# Patient Record
Sex: Female | Born: 1975
Health system: Southern US, Community
[De-identification: ages and names within clinical notes are randomized; demographics above are authoritative.]

## PROBLEM LIST (undated history)

## (undated) DIAGNOSIS — E039 Hypothyroidism, unspecified: Secondary | ICD-10-CM

## (undated) DIAGNOSIS — F419 Anxiety disorder, unspecified: Secondary | ICD-10-CM

## (undated) DIAGNOSIS — F32A Depression, unspecified: Secondary | ICD-10-CM

## (undated) DIAGNOSIS — F319 Bipolar disorder, unspecified: Secondary | ICD-10-CM

## (undated) DIAGNOSIS — F429 Obsessive-compulsive disorder, unspecified: Secondary | ICD-10-CM

## (undated) DIAGNOSIS — R7303 Prediabetes: Secondary | ICD-10-CM

## (undated) DIAGNOSIS — I10 Essential (primary) hypertension: Secondary | ICD-10-CM

## (undated) DIAGNOSIS — G43909 Migraine, unspecified, not intractable, without status migrainosus: Secondary | ICD-10-CM

## (undated) DIAGNOSIS — F909 Attention-deficit hyperactivity disorder, unspecified type: Secondary | ICD-10-CM

## (undated) DIAGNOSIS — G4730 Sleep apnea, unspecified: Secondary | ICD-10-CM

## (undated) DIAGNOSIS — T7840XA Allergy, unspecified, initial encounter: Secondary | ICD-10-CM

## (undated) DIAGNOSIS — M549 Dorsalgia, unspecified: Secondary | ICD-10-CM

## (undated) DIAGNOSIS — E282 Polycystic ovarian syndrome: Secondary | ICD-10-CM

## (undated) DIAGNOSIS — J45909 Unspecified asthma, uncomplicated: Secondary | ICD-10-CM

## (undated) DIAGNOSIS — Z9109 Other allergy status, other than to drugs and biological substances: Secondary | ICD-10-CM

## (undated) DIAGNOSIS — E785 Hyperlipidemia, unspecified: Secondary | ICD-10-CM

## (undated) DIAGNOSIS — G5603 Carpal tunnel syndrome, bilateral upper limbs: Secondary | ICD-10-CM

## (undated) HISTORY — DX: Unspecified asthma, uncomplicated: J45.909

## (undated) HISTORY — DX: Allergy, unspecified, initial encounter: T78.40XA

## (undated) HISTORY — DX: Sleep apnea, unspecified: G47.30

## (undated) HISTORY — PX: EYE SURGERY: SHX253

## (undated) HISTORY — DX: Hyperlipidemia, unspecified: E78.5

## (undated) HISTORY — DX: Essential (primary) hypertension: I10

## (undated) HISTORY — PX: TONSILLECTOMY: SUR1361

## (undated) HISTORY — DX: Depression, unspecified: F32.A

## (undated) HISTORY — DX: Hypothyroidism, unspecified: E03.9

## (undated) HISTORY — DX: Dorsalgia, unspecified: M54.9

## (undated) HISTORY — DX: Prediabetes: R73.03

## (undated) HISTORY — PX: TONSILECTOMY, ADENOIDECTOMY, BILATERAL MYRINGOTOMY AND TUBES: SHX2538

---

## 1997-09-11 ENCOUNTER — Other Ambulatory Visit: Admission: RE | Admit: 1997-09-11 | Discharge: 1997-09-11 | Payer: Self-pay | Admitting: *Deleted

## 1998-09-09 ENCOUNTER — Other Ambulatory Visit: Admission: RE | Admit: 1998-09-09 | Discharge: 1998-09-09 | Payer: Self-pay | Admitting: *Deleted

## 1999-09-06 ENCOUNTER — Other Ambulatory Visit: Admission: RE | Admit: 1999-09-06 | Discharge: 1999-09-06 | Payer: Self-pay | Admitting: Obstetrics and Gynecology

## 2000-09-11 ENCOUNTER — Other Ambulatory Visit: Admission: RE | Admit: 2000-09-11 | Discharge: 2000-09-11 | Payer: Self-pay | Admitting: Obstetrics and Gynecology

## 2004-07-07 ENCOUNTER — Ambulatory Visit: Payer: Self-pay | Admitting: Family Medicine

## 2004-07-14 ENCOUNTER — Encounter: Admission: RE | Admit: 2004-07-14 | Discharge: 2004-07-14 | Payer: Self-pay | Admitting: Family Medicine

## 2004-07-21 ENCOUNTER — Ambulatory Visit: Payer: Self-pay | Admitting: Family Medicine

## 2004-10-18 ENCOUNTER — Other Ambulatory Visit: Admission: RE | Admit: 2004-10-18 | Discharge: 2004-10-18 | Payer: Self-pay | Admitting: Obstetrics and Gynecology

## 2004-10-21 ENCOUNTER — Ambulatory Visit: Payer: Self-pay | Admitting: Family Medicine

## 2006-12-11 ENCOUNTER — Ambulatory Visit: Payer: Self-pay | Admitting: Cardiology

## 2010-07-19 NOTE — Assessment & Plan Note (Signed)
[facility]                            CARDIOLOGY OFFICE NOTE   NAME:Park, [name] GUNDLACH                    MRN:          [id number]  DATE:12/11/2006                            DOB:          1975-07-22    CHIEF COMPLAINT:  I am worried about my cholesterol and my family  history of heart disease.   HISTORY OF PRESENT ILLNESS:  [name] Park is a 35 year old single white  female, daughter of patients of mine, who comes today referred for  cardiovascular risk assessment.   Her blood pressure was up recently, but she thought this was secondary  to stress.  She has a history of mixed hyperlipidemia, total cholesterol  of 219, triglycerides 284, HDL 50, LDL 112, VLDL 57.  She also has a  history of being overweight and does exercise, but only about twice a  week.  She denies any chest discomfort or angina.  She has some mild  shortness of breath with exertion, which is probably deconditioning.   PAST MEDICAL HISTORY:  She has no history of dye allergy.  She is  sensitive to YELLOW FOOD DYE.   CURRENT MEDICATIONS:  1. Lorazepam 1 mg 1 to 2 a day.  2. Metformin 500 mg 2 to 3 times a day for polycystic ovary disease.  3. Zyrtec 10 mg a day.  4. Imipramine 50 mg p.o. t.i.d.  5. Lamotrigine 200 mg a day.   She has a history of bipolar disorder, polycystic ovary disease.  Her  recent hemoglobin A1c and thyroid profile were normal.  Recent  comprehensive metabolic panel was also normal, except for a blood sugar  of 140.  Hemoglobin A1c was 5.7.   She does not smoke.  She does not use any recreational drugs.  She has  about 1 drink a day of alcohol.   PREVIOUS SURGERIES:  Eye surgery of both eyes in the 1980s.  Tonsillectomy in the 1980s.   FAMILY HISTORY:  Father had coronary artery disease in his 90s.  He is  still alive.   SOCIAL HISTORY:  Single.  She works in administration.  She spends a  large amount of her time sitting and standing.  There is  no manual work  involved.   REVIEW OF SYSTEMS:  Other than allergies and hay fever, menstrual  dysfunction, anxiety, depression, negative review of systems.   EXAM:  A very pleasant lady in no acute distress.  She is alert and  oriented x3.  Blood pressure is 106/74, pulse 93 and regular.  Her electrocardiogram  is normal, except for some nonspecific T wave changes.  She is 5 feet 1-  1/2 inches, 172 pounds.  HEENT:  Normocephalic, atraumatic.  PERRLA.  Extraocular muscles are  intact.  Sclerae clear.  She wears glasses.  Facial symmetry is normal.  Dentition is satisfactory.  NECK:  Supple.  Carotid upstrokes are equal bilaterally without bruits.  No JVD.  Thyroid is not enlarged.  Trachea is midline.  LUNGS:  Clear.  HEART:  Regular rate and rhythm.  Nondisplaced PMI.  Normal S1, S2.  ABDOMEN:  Soft.  Obese.  Organomegaly cannot be adequately assessed.  EXTREMITIES:  No edema.  Pulses are intact.  NEURO:  Intact.  SKIN:  Unremarkable.   ASSESSMENT AND PLAN:  Ms. Coles biggest problem is her weight and  mixed hyperlipidemia, which is fairly mild.   RECOMMENDATIONS:  1. Cardio activity 3 hours per week.  I told her this would reduce her      risk for premature cardiovascular event by 20-25% per year.  2. Decrease her weight.  3. Avoid high glycemic foods.  Web site was given for her review.      Specific common foods were reviewed as well.  4. Follow blood pressure, though this is not high today.  5. See me back p.r.n.     Thomas C. Daleen Squibb, MD, [facility]  Electronically Signed    TCW/MedQ  DD: 12/11/2006  DT: 12/11/2006  Job #: [id number]   cc:   Marcelino Duster L. Vincente Poli, M.D.

## 2012-11-22 ENCOUNTER — Encounter (HOSPITAL_COMMUNITY): Payer: Self-pay

## 2012-11-22 ENCOUNTER — Emergency Department (HOSPITAL_COMMUNITY)
Admission: EM | Admit: 2012-11-22 | Discharge: 2012-11-22 | Disposition: A | Discharge status: Home / Self Care | Payer: BC Managed Care – PPO | Attending: Emergency Medicine | Admitting: Emergency Medicine

## 2012-11-22 DIAGNOSIS — S0990XD Unspecified injury of head, subsequent encounter: Secondary | ICD-10-CM

## 2012-11-22 DIAGNOSIS — F429 Obsessive-compulsive disorder, unspecified: Secondary | ICD-10-CM | POA: Insufficient documentation

## 2012-11-22 DIAGNOSIS — F319 Bipolar disorder, unspecified: Secondary | ICD-10-CM | POA: Insufficient documentation

## 2012-11-22 DIAGNOSIS — F909 Attention-deficit hyperactivity disorder, unspecified type: Secondary | ICD-10-CM | POA: Insufficient documentation

## 2012-11-22 DIAGNOSIS — Z8669 Personal history of other diseases of the nervous system and sense organs: Secondary | ICD-10-CM | POA: Insufficient documentation

## 2012-11-22 DIAGNOSIS — G43909 Migraine, unspecified, not intractable, without status migrainosus: Secondary | ICD-10-CM | POA: Insufficient documentation

## 2012-11-22 DIAGNOSIS — Z79899 Other long term (current) drug therapy: Secondary | ICD-10-CM | POA: Insufficient documentation

## 2012-11-22 DIAGNOSIS — F411 Generalized anxiety disorder: Secondary | ICD-10-CM | POA: Insufficient documentation

## 2012-11-22 DIAGNOSIS — Z8679 Personal history of other diseases of the circulatory system: Secondary | ICD-10-CM | POA: Insufficient documentation

## 2012-11-22 DIAGNOSIS — Z8742 Personal history of other diseases of the female genital tract: Secondary | ICD-10-CM | POA: Insufficient documentation

## 2012-11-22 DIAGNOSIS — G44309 Post-traumatic headache, unspecified, not intractable: Secondary | ICD-10-CM | POA: Insufficient documentation

## 2012-11-22 HISTORY — DX: Polycystic ovarian syndrome: E28.2

## 2012-11-22 HISTORY — DX: Obsessive-compulsive disorder, unspecified: F42.9

## 2012-11-22 HISTORY — DX: Attention-deficit hyperactivity disorder, unspecified type: F90.9

## 2012-11-22 HISTORY — DX: Migraine, unspecified, not intractable, without status migrainosus: G43.909

## 2012-11-22 HISTORY — DX: Carpal tunnel syndrome, bilateral upper limbs: G56.03

## 2012-11-22 HISTORY — DX: Anxiety disorder, unspecified: F41.9

## 2012-11-22 HISTORY — DX: Other allergy status, other than to drugs and biological substances: Z91.09

## 2012-11-22 HISTORY — DX: Bipolar disorder, unspecified: F31.9

## 2012-11-22 NOTE — ED Provider Notes (Signed)
TIME SEEN: 5:37 PM  CHIEF COMPLAINT: Head injury  HPI: Patient is a 37 year old female with a history of chronic migraine headaches currently receiving Botox injections, OCD, ADD, bipolar disorder who presents emergency department for evaluation after head injury this morning. Patient reports that she was at her job and lifting a box of oranges onto a shelf. She states the box BM sliding backwards and knocked her into a countertop or she hit the right side of her head. She denies any loss of consciousness. She has had mild headache on the right side of her head since that she describes as a throbbing without radiation. She is also having bilateral neck pain which she believes may be from her Botox injections. No confusion, nausea or vomiting, numbness, tingling or focal weakness.  ROS: See HPI Constitutional: no fever  Eyes: no drainage  ENT: no runny nose   Cardiovascular:  no chest pain  Resp: no SOB  GI: no vomiting GU: no dysuria Integumentary: no rash  Allergy: no hives  Musculoskeletal: no leg swelling  Neurological: no slurred speech ROS otherwise negative  PAST MEDICAL HISTORY/PAST SURGICAL HISTORY:  Past Medical History  Diagnosis Date  . Migraine   . Environmental allergies   . Polycystic ovarian disease   . Bipolar 1 disorder   . Anxiety   . OCD (obsessive compulsive disorder)   . ADHD (attention deficit hyperactivity disorder)   . Carpal tunnel syndrome, bilateral     MEDICATIONS:  Prior to Admission medications   Medication Sig Start Date End Date Taking? Authorizing Provider  albuterol (PROVENTIL HFA;VENTOLIN HFA) 108 (90 BASE) MCG/ACT inhaler Inhale 2 puffs into the lungs every 6 (six) hours as needed for wheezing.   Yes Historical Provider, MD  amphetamine-dextroamphetamine (ADDERALL) 30 MG tablet Take 30 mg by mouth daily.   Yes Historical Provider, MD  baclofen (LIORESAL) 10 MG tablet Take 10 mg by mouth daily as needed (for migraines).   Yes Historical  Provider, MD  cetirizine (ZYRTEC) 10 MG tablet Take 10 mg by mouth daily.   Yes Historical Provider, MD  chlorproMAZINE (THORAZINE) 25 MG tablet Take 25 mg by mouth 3 (three) times daily.   Yes Historical Provider, MD  diazepam (VALIUM) 10 MG tablet Take 10 mg by mouth 3 (three) times daily.   Yes Historical Provider, MD  escitalopram (LEXAPRO) 10 MG tablet Take 10 mg by mouth daily.   Yes Historical Provider, MD  fluticasone (VERAMYST) 27.5 MCG/SPRAY nasal spray Place 2 sprays into the nose daily as needed for allergies.   Yes Historical Provider, MD  ibuprofen (ADVIL,MOTRIN) 200 MG tablet Take 400 mg by mouth every 6 (six) hours as needed (for back pain).   Yes Historical Provider, MD  lamoTRIgine (LAMICTAL) 150 MG tablet Take 300 mg by mouth at bedtime.   Yes Historical Provider, MD  Levonorgestrel-Ethinyl Estradiol (SEASONIQUE) 0.15-0.03 &0.01 MG tablet Take 1 tablet by mouth daily.   Yes Historical Provider, MD  metFORMIN (GLUCOPHAGE) 500 MG tablet Take 500 mg by mouth 3 (three) times daily.   Yes Historical Provider, MD  montelukast (SINGULAIR) 10 MG tablet Take 10 mg by mouth every evening.   Yes Historical Provider, MD  OnabotulinumtoxinA (BOTOX IJ) Inject as directed.   Yes Historical Provider, MD  PRESCRIPTION MEDICATION Apply 1 application topically. For facial rosacea   Yes Historical Provider, MD  QUEtiapine (SEROQUEL) 100 MG tablet Take 100 mg by mouth at bedtime.   Yes Historical Provider, MD  spironolactone (ALDACTONE) 50 MG tablet  Take 50 mg by mouth daily.   Yes Historical Provider, MD    ALLERGIES:  Allergies  Allergen Reactions  . Latex Rash    SOCIAL HISTORY:  History  Substance Use Topics  . Smoking status: Never Smoker   . Smokeless tobacco: Not on file  . Alcohol Use: Yes     Comment: ocassional     FAMILY HISTORY: History reviewed. No pertinent family history.  EXAM: BP 126/78  Pulse 89  Temp(Src) 98.5 F (36.9 C) (Oral)  Resp 16  SpO2 98%  LMP  09/21/2012 CONSTITUTIONAL: Alert and oriented and responds appropriately to questions. Well-appearing; well-nourished HEAD: Normocephalic, no lesions on the right side of her scalp EYES: Conjunctivae clear, PERRL ENT: normal nose; no rhinorrhea; moist mucous membranes; pharynx without lesions noted NECK: Supple, no meningismus, no LAD , no midline spinal tenderness, step-off or deformity CARD: RRR; S1 and S2 appreciated; no murmurs, no clicks, no rubs, no gallops RESP: Normal chest excursion without splinting or tachypnea; breath sounds clear and equal bilaterally; no wheezes, no rhonchi, no rales,  ABD/GI: Normal bowel sounds; non-distended; soft, non-tender, no rebound, no guarding BACK:  The back appears normal and is non-tender to palpation, there is no CVA tenderness EXT: Normal ROM in all joints; non-tender to palpation; no edema; normal capillary refill; no cyanosis    SKIN: Normal color for age and race; warm NEURO: Moves all extremities equally, strength 5/5 in all 4 extremities, sensation to light touch intact diffusely, cranial nerves II through XII intact, no dysmetria to finger-nose testing, normal gait PSYCH: The patient's mood and manner are appropriate. Grooming and personal hygiene are appropriate.  MEDICAL DECISION MAKING: Patient with minor head injury with possible mild concussion. Discussed with patient and mother at bedside I do not feel the patient needs a head CT at this time given her headache as mild, she is neurologically intact in the incident was several hours ago. Patient and mother agree with this plan. Patient has pain medication at home. Given strict return precautions for head injury and supportive care instructions.       Layla Maw Dreyson Mishkin, DO 11/22/12 1739

## 2012-11-22 NOTE — ED Notes (Signed)
Pt c/o Right side head pain due to hitting her head on the corner of a metal shelf while trying to push a box of oranges under the shelf. Pt denies dizziness, sensitivity to light, nausea or vomiting. Pt seen at [facility] and sent her to rule out a concussion or possible blood clot. Pt just received botox injections 11/27/2012 and is still experiencing neck pain from the injections

## 2012-12-18 ENCOUNTER — Other Ambulatory Visit: Payer: Self-pay | Admitting: Specialist

## 2012-12-25 ENCOUNTER — Ambulatory Visit
Admission: RE | Admit: 2012-12-25 | Discharge: 2012-12-25 | Disposition: A | Discharge status: Home / Self Care | Payer: BC Managed Care – PPO | Source: Ambulatory Visit | Attending: Specialist | Admitting: Specialist

## 2015-05-13 ENCOUNTER — Other Ambulatory Visit (INDEPENDENT_AMBULATORY_CARE_PROVIDER_SITE_OTHER): Payer: Self-pay | Admitting: Otolaryngology

## 2015-05-13 DIAGNOSIS — J329 Chronic sinusitis, unspecified: Secondary | ICD-10-CM

## 2015-05-24 ENCOUNTER — Ambulatory Visit
Admission: RE | Admit: 2015-05-24 | Discharge: 2015-05-24 | Disposition: A | Discharge status: Home / Self Care | Payer: BLUE CROSS/BLUE SHIELD | Source: Ambulatory Visit | Attending: Otolaryngology | Admitting: Otolaryngology

## 2015-05-24 DIAGNOSIS — J329 Chronic sinusitis, unspecified: Secondary | ICD-10-CM

## 2015-05-29 ENCOUNTER — Encounter (HOSPITAL_COMMUNITY): Payer: Self-pay

## 2015-05-29 ENCOUNTER — Emergency Department (HOSPITAL_COMMUNITY)
Admission: EM | Admit: 2015-05-29 | Discharge: 2015-05-29 | Disposition: A | Discharge status: Home / Self Care | Payer: Worker's Compensation | Attending: Emergency Medicine | Admitting: Emergency Medicine

## 2015-05-29 DIAGNOSIS — S199XXA Unspecified injury of neck, initial encounter: Secondary | ICD-10-CM | POA: Insufficient documentation

## 2015-05-29 DIAGNOSIS — F909 Attention-deficit hyperactivity disorder, unspecified type: Secondary | ICD-10-CM | POA: Insufficient documentation

## 2015-05-29 DIAGNOSIS — S0003XA Contusion of scalp, initial encounter: Secondary | ICD-10-CM | POA: Diagnosis not present

## 2015-05-29 DIAGNOSIS — F319 Bipolar disorder, unspecified: Secondary | ICD-10-CM | POA: Insufficient documentation

## 2015-05-29 DIAGNOSIS — Z793 Long term (current) use of hormonal contraceptives: Secondary | ICD-10-CM | POA: Diagnosis not present

## 2015-05-29 DIAGNOSIS — F419 Anxiety disorder, unspecified: Secondary | ICD-10-CM | POA: Insufficient documentation

## 2015-05-29 DIAGNOSIS — G8929 Other chronic pain: Secondary | ICD-10-CM | POA: Insufficient documentation

## 2015-05-29 DIAGNOSIS — Z79899 Other long term (current) drug therapy: Secondary | ICD-10-CM | POA: Diagnosis not present

## 2015-05-29 DIAGNOSIS — F429 Obsessive-compulsive disorder, unspecified: Secondary | ICD-10-CM | POA: Insufficient documentation

## 2015-05-29 DIAGNOSIS — Z91040 Latex allergy status: Secondary | ICD-10-CM | POA: Insufficient documentation

## 2015-05-29 DIAGNOSIS — Y990 Civilian activity done for income or pay: Secondary | ICD-10-CM | POA: Insufficient documentation

## 2015-05-29 DIAGNOSIS — Y92210 Daycare center as the place of occurrence of the external cause: Secondary | ICD-10-CM | POA: Insufficient documentation

## 2015-05-29 DIAGNOSIS — G43909 Migraine, unspecified, not intractable, without status migrainosus: Secondary | ICD-10-CM | POA: Diagnosis not present

## 2015-05-29 DIAGNOSIS — Z8639 Personal history of other endocrine, nutritional and metabolic disease: Secondary | ICD-10-CM | POA: Insufficient documentation

## 2015-05-29 DIAGNOSIS — Y9389 Activity, other specified: Secondary | ICD-10-CM | POA: Diagnosis not present

## 2015-05-29 DIAGNOSIS — Z7984 Long term (current) use of oral hypoglycemic drugs: Secondary | ICD-10-CM | POA: Insufficient documentation

## 2015-05-29 DIAGNOSIS — S0990XA Unspecified injury of head, initial encounter: Secondary | ICD-10-CM

## 2015-05-29 DIAGNOSIS — W01190A Fall on same level from slipping, tripping and stumbling with subsequent striking against furniture, initial encounter: Secondary | ICD-10-CM | POA: Insufficient documentation

## 2015-05-29 MED ORDER — CYCLOBENZAPRINE HCL 10 MG PO TABS: 10.0000 mg | ORAL_TABLET | Freq: Two times a day (BID) | ORAL | Status: DC | PRN

## 2015-05-29 NOTE — ED Notes (Signed)
Sheliah MendsLeisa, PA at the bedside

## 2015-05-29 NOTE — Discharge Instructions (Signed)
For your head pain, take your home pain meds, use tylenol and ibuprofen as needed.  I have provided muscle relaxers for your neck pain.  Follow up with your regular doctor or neurologist if not improving with resting.   Head Injury, Adult You have a head injury. Headaches and throwing up (vomiting) are common after a head injury. It should be easy to wake up from sleeping. Sometimes you must stay in the hospital. Most problems happen within the first 24 hours. Side effects may occur up to 7-10 days after the injury.  WHAT ARE THE TYPES OF HEAD INJURIES? Head injuries can be as minor as a bump. Some head injuries can be more severe. More severe head injuries include:  A jarring injury to the brain (concussion).  A bruise of the brain (contusion). This mean there is bleeding in the brain that can cause swelling.  A cracked skull (skull fracture).  Bleeding in the brain that collects, clots, and forms a bump (hematoma). WHEN SHOULD I GET HELP RIGHT AWAY?   You are confused or sleepy.  You cannot be woken up.  You feel sick to your stomach (nauseous) or keep throwing up (vomiting).  Your dizziness or unsteadiness is getting worse.  You have very bad, lasting headaches that are not helped by medicine. Take medicines only as told by your doctor.  You cannot use your arms or legs like normal.  You cannot walk.  You notice changes in the black spots in the center of the colored part of your eye (pupil).  You have clear or bloody fluid coming from your nose or ears.  You have trouble seeing. During the next 24 hours after the injury, you must stay with someone who can watch you. This person should get help right away (call 911 in the [place] if you start to shake and are not able to control it (have seizures), you pass out, or you are unable to wake up. HOW CAN I PREVENT A HEAD INJURY IN THE FUTURE?  Wear seat belts.  Wear a helmet while bike riding and playing sports like  football.  Stay away from dangerous activities around the house. WHEN CAN I RETURN TO NORMAL ACTIVITIES AND ATHLETICS? See your doctor before doing these activities. You should not do normal activities or play contact sports until 1 week after the following symptoms have stopped:  Headache that does not go away.  Dizziness.  Poor attention.  Confusion.  Memory problems.  Sickness to your stomach or throwing up.  Tiredness.  Fussiness.  Bothered by bright lights or loud noises.  Anxiousness or depression.  Restless sleep. MAKE SURE YOU:   Understand these instructions.  Will watch your condition.  Will get help right away if you are not doing well or get worse.   This information is not intended to replace advice given to you by your health care provider. Make sure you discuss any questions you have with your health care provider.   Document Released: 02/03/2008 Document Revised: 03/13/2014 Document Reviewed: 10/28/2012 Elsevier Interactive Patient Education 2016 [employer] Inc[employer]   Facial or Scalp Contusion A facial or scalp contusion is a deep bruise on the face or head. Injuries to the face and head generally cause a lot of swelling, especially around the eyes. Contusions are the result of an injury that caused bleeding under the skin. The contusion may turn blue, purple, or yellow. Minor injuries will give you a painless contusion, but more severe contusions may stay painful  and swollen for a few weeks.  CAUSES  A facial or scalp contusion is caused by a blunt injury or trauma to the face or head area.  SIGNS AND SYMPTOMS   Swelling of the injured area.   Discoloration of the injured area.   Tenderness, soreness, or pain in the injured area.  DIAGNOSIS  The diagnosis can be made by taking a medical history and doing a physical exam. An X-ray exam, CT scan, or MRI may be needed to determine if there are any associated injuries, such as broken bones  (fractures). TREATMENT  Often, the best treatment for a facial or scalp contusion is applying cold compresses to the injured area. Over-the-counter medicines may also be recommended for pain control.  HOME CARE INSTRUCTIONS   Only take over-the-counter or prescription medicines as directed by your health care provider.   Apply ice to the injured area.   Put ice in a plastic bag.   Place a towel between your skin and the bag.   Leave the ice on for 20 minutes, 2-3 times a day.  SEEK MEDICAL CARE IF:  You have bite problems.   You have pain with chewing.   You are concerned about facial defects. SEEK IMMEDIATE MEDICAL CARE IF:  You have severe pain or a headache that is not relieved by medicine.   You have unusual sleepiness, confusion, or personality changes.   You throw up (vomit).   You have a persistent nosebleed.   You have double vision or blurred vision.   You have fluid drainage from your nose or ear.   You have difficulty walking or using your arms or legs.  MAKE SURE YOU:   Understand these instructions.  Will watch your condition.  Will get help right away if you are not doing well or get worse.   This information is not intended to replace advice given to you by your health care provider. Make sure you discuss any questions you have with your health care provider.   Document Released: 03/30/2004 Document Revised: 03/13/2014 Document Reviewed: 10/03/2012 Elsevier Interactive Patient Education [employer]

## 2015-05-29 NOTE — ED Provider Notes (Signed)
CSN: [id number]     Arrival date & time 05/29/15  1008 History   First MD Initiated Contact with Patient 05/29/15 1053     Chief Complaint  Patient presents with  . Head Injury     (Consider location/radiation/quality/duration/timing/severity/associated sxs/prior Treatment) HPI   Patient is a 40 year old female with history of migraines, bipolar, anxiety, OCD, ADHD, she presents emergency department for evaluation of head injury that occurred yesterday while at work. 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 She denies loss of consciousness, bleeding or laceration. She denies blood thinner use.  She states this feels a bump all over the back of her head and has associated achy and dull pain on her scalp, rated 6 out of 10. Patient was encouraged by her work to get evaluated for the injury. She first presented to urgent care with and referred her to the ER because they were unable to do any imaging of her head.   She states that she has persistent migraines 24 hours a day, is treated with multiple medications and injections. She states that her head injury has not worsened her chronic migraine pain and she has not taken medication for her migraines today. She has mild neck soreness which has gradually increased since the time the accident. She denies any visual disturbances, blurry vision, vertigo, confusion, syncope, nausea, vomiting, drainage from ears or nose. She denies any other complaints at this time.  Past Medical History  Diagnosis Date  . Migraine   . Environmental allergies   . Polycystic ovarian disease   . Bipolar 1 disorder (HCC)   . Anxiety   . OCD (obsessive compulsive disorder)   . ADHD (attention deficit hyperactivity disorder)   . Carpal tunnel syndrome, bilateral    Past Surgical History  Procedure Laterality Date  . Tonsillectomy    . Eye surgery    . Tonsilectomy,  adenoidectomy, bilateral myringotomy and tubes     No family history on file. Social History  Substance Use Topics  . Smoking status: Never Smoker   . Smokeless tobacco: None  . Alcohol Use: Yes     Comment: ocassional    OB History    No data available     Review of Systems  Constitutional: Negative.  Negative for fever, chills, diaphoresis, activity change, appetite change and fatigue.  HENT: Negative.   Eyes: Negative.  Negative for photophobia and visual disturbance.  Respiratory: Negative.   Cardiovascular: Negative.   Gastrointestinal: Negative.   Endocrine: Negative.   Genitourinary: Negative.   Musculoskeletal: Positive for neck pain. Negative for back pain, gait problem and neck stiffness.  Skin: Negative.  Negative for color change, pallor and wound.  Neurological: Negative for dizziness, tremors, syncope, facial asymmetry, weakness, light-headedness, numbness and headaches.  Psychiatric/Behavioral: Negative.   All other systems reviewed and are negative.     Allergies  Latex  Home Medications   Prior to Admission medications   Medication Sig Start Date End Date Taking? Authorizing Provider  albuterol (PROVENTIL HFA;VENTOLIN HFA) 108 (90 BASE) MCG/ACT inhaler Inhale 2 puffs into the lungs every 6 (six) hours as needed for wheezing.    Historical Provider, MD  amphetamine-dextroamphetamine (ADDERALL) 30 MG tablet Take 30 mg by mouth daily.    Historical Provider, MD  baclofen (LIORESAL) 10 MG tablet Take 10 mg by mouth daily as needed (for migraines).    Historical Provider,  MD  cetirizine (ZYRTEC) 10 MG tablet Take 10 mg by mouth daily.    Historical Provider, MD  chlorproMAZINE (THORAZINE) 25 MG tablet Take 25 mg by mouth 3 (three) times daily.    Historical Provider, MD  cyclobenzaprine (FLEXERIL) 10 MG tablet Take 1 tablet (10 mg total) by mouth 2 (two) times daily as needed for muscle spasms. 05/29/15   Danelle Berry, PA-C  diazepam (VALIUM) 10 MG tablet Take  10 mg by mouth 3 (three) times daily.    Historical Provider, MD  escitalopram (LEXAPRO) 10 MG tablet Take 10 mg by mouth daily.    Historical Provider, MD  fluticasone (VERAMYST) 27.5 MCG/SPRAY nasal spray Place 2 sprays into the nose daily as needed for allergies.    Historical Provider, MD  ibuprofen (ADVIL,MOTRIN) 200 MG tablet Take 400 mg by mouth every 6 (six) hours as needed (for back pain).    Historical Provider, MD  lamoTRIgine (LAMICTAL) 150 MG tablet Take 300 mg by mouth at bedtime.    Historical Provider, MD  Levonorgestrel-Ethinyl Estradiol (SEASONIQUE) 0.15-0.03 &0.01 MG tablet Take 1 tablet by mouth daily.    Historical Provider, MD  metFORMIN (GLUCOPHAGE) 500 MG tablet Take 500 mg by mouth 3 (three) times daily.    Historical Provider, MD  montelukast (SINGULAIR) 10 MG tablet Take 10 mg by mouth every evening.    Historical Provider, MD  OnabotulinumtoxinA (BOTOX IJ) Inject as directed.    Historical Provider, MD  PRESCRIPTION MEDICATION Apply 1 application topically. For facial rosacea    Historical Provider, MD  QUEtiapine (SEROQUEL) 100 MG tablet Take 100 mg by mouth at bedtime.    Historical Provider, MD  spironolactone (ALDACTONE) 50 MG tablet Take 50 mg by mouth daily.    Historical Provider, MD   BP 112/78 mmHg  Pulse 98  Temp(Src) 98.3 F (36.8 C) (Oral)  Resp 16  SpO2 99% Physical Exam  Constitutional: She is oriented to person, place, and time. She appears well-developed and well-nourished. No distress.  HENT:  Head: Normocephalic and atraumatic. Head is without raccoon's eyes, without Battle's sign, without abrasion, without contusion, without laceration, without right periorbital erythema and without left periorbital erythema. Hair is normal.  Right Ear: Tympanic membrane and external ear normal.  Left Ear: Tympanic membrane and external ear normal.  Nose: Nose normal.  Mouth/Throat: Uvula is midline, oropharynx is clear and moist and mucous membranes are  normal. No oropharyngeal exudate.  Eyes: Conjunctivae and EOM are normal. Pupils are equal, round, and reactive to light. Right eye exhibits no discharge. Left eye exhibits no discharge. No scleral icterus.  Neck: Normal range of motion and phonation normal. Neck supple. No JVD present. Muscular tenderness present. No spinous process tenderness present. No rigidity. No tracheal deviation, no edema, no erythema and normal range of motion present. No thyromegaly present.    Cardiovascular: Normal rate, regular rhythm, normal heart sounds and intact distal pulses.  Exam reveals no gallop and no friction rub.   No murmur heard. Pulmonary/Chest: Effort normal and breath sounds normal. No respiratory distress. She has no wheezes. She has no rales. She exhibits no tenderness.  Abdominal: Soft. Bowel sounds are normal. She exhibits no distension and no mass. There is no tenderness. There is no rebound and no guarding.  Musculoskeletal: Normal range of motion. She exhibits no edema or tenderness.  Lymphadenopathy:    She has no cervical adenopathy.  Neurological: She is alert and oriented to person, place, and time. She has normal strength  and normal reflexes. She is not disoriented. She displays no tremor. No cranial nerve deficit or sensory deficit. She exhibits normal muscle tone. Coordination and gait normal. GCS eye subscore is 4. GCS verbal subscore is 5. GCS motor subscore is 6.  Speech is clear and goal oriented, follows commands CN 2-12 grossly intact Normal strength in upper and lower extremities bilaterally including dorsiflexion and plantar flexion, strong and equal grip strength Sensation normal to light and sharp touch Moves extremities without ataxia, coordination intact Normal finger to nose Neg romberg, no pronator drift Normal gait and balance   Skin: Skin is warm and dry. No rash noted. She is not diaphoretic. No erythema. No pallor.  Psychiatric: She has a normal mood and affect.  Her behavior is normal. Judgment and thought content normal.  Nursing note and vitals reviewed.   ED Course  Procedures (including critical care time) Labs Review Labs Reviewed - No data to display  Imaging Review No results found. I have personally reviewed and evaluated these images and lab results as part of my medical decision-making.   EKG Interpretation None      MDM   Pt with minor head injury that occurred yesterday, no LOC, no blood thinner use, no visible or palpable evidence of injury to posterior scalp.  Pt has unremarkable PE, normal neuro.  No indication for CT imaging.  She has hx of chronic and severe migraines, which she reports have not worsened since her head injury.  She has mild posterior neck muscle tenderness, feel Flexeril is appropriate for associated soreness and stiffness. She has no midline tenderness.  She was encouraged to rest, as she may have a concussion. She was encouraged to see her PCP or neurologist if she does not improve with rest and supportive treatment.  A work note was provided until she can see her doctor on Tuesday.  Final diagnoses:  Closed head injury, initial encounter  Scalp contusion, initial encounter       Danelle Berry, PA-C 05/31/15 0305  Rolan Bucco, MD 06/05/15 [phone]

## 2015-05-29 NOTE — ED Notes (Addendum)
Per Pt, Pt was at work last night when she fell back and hit her head on a table. Pt denies loss of consciousness, bleeding, or laceration. Pt reports edema noted to the site of the injury yesterday. Pt was told to go to [facility] by employer, but office was closed. Pt was told to go to [facility]. While at [facility], pt was not able to get any imaging so she stated they sent her over here. Pt is alert and oriented x4. Pt ambulated upon arrival. Pt denies being on blood thinners. Hx of POCS, Bipolar, OCD, ADD, and Migraines,

## 2015-11-18 ENCOUNTER — Other Ambulatory Visit: Payer: Self-pay | Admitting: Allergy and Immunology

## 2015-11-18 ENCOUNTER — Ambulatory Visit
Admission: RE | Admit: 2015-11-18 | Discharge: 2015-11-18 | Disposition: A | Discharge status: Home / Self Care | Payer: BLUE CROSS/BLUE SHIELD | Source: Ambulatory Visit | Attending: Allergy and Immunology | Admitting: Allergy and Immunology

## 2015-11-18 DIAGNOSIS — R059 Cough, unspecified: Secondary | ICD-10-CM

## 2015-11-18 DIAGNOSIS — R05 Cough: Secondary | ICD-10-CM

## 2015-12-24 ENCOUNTER — Ambulatory Visit
Admission: RE | Admit: 2015-12-24 | Discharge: 2015-12-24 | Disposition: A | Discharge status: Home / Self Care | Payer: BLUE CROSS/BLUE SHIELD | Source: Ambulatory Visit | Attending: Allergy and Immunology | Admitting: Allergy and Immunology

## 2015-12-24 ENCOUNTER — Other Ambulatory Visit: Payer: Self-pay | Admitting: Allergy and Immunology

## 2015-12-24 DIAGNOSIS — J180 Bronchopneumonia, unspecified organism: Secondary | ICD-10-CM

## 2016-03-16 DIAGNOSIS — F3130 Bipolar disorder, current episode depressed, mild or moderate severity, unspecified: Secondary | ICD-10-CM | POA: Diagnosis not present

## 2016-03-29 DIAGNOSIS — J301 Allergic rhinitis due to pollen: Secondary | ICD-10-CM | POA: Diagnosis not present

## 2016-03-29 DIAGNOSIS — J3089 Other allergic rhinitis: Secondary | ICD-10-CM | POA: Diagnosis not present

## 2016-03-29 DIAGNOSIS — J3081 Allergic rhinitis due to animal (cat) (dog) hair and dander: Secondary | ICD-10-CM | POA: Diagnosis not present

## 2016-04-12 DIAGNOSIS — J3081 Allergic rhinitis due to animal (cat) (dog) hair and dander: Secondary | ICD-10-CM | POA: Diagnosis not present

## 2016-04-12 DIAGNOSIS — J301 Allergic rhinitis due to pollen: Secondary | ICD-10-CM | POA: Diagnosis not present

## 2016-04-12 DIAGNOSIS — J3089 Other allergic rhinitis: Secondary | ICD-10-CM | POA: Diagnosis not present

## 2016-04-19 DIAGNOSIS — J301 Allergic rhinitis due to pollen: Secondary | ICD-10-CM | POA: Diagnosis not present

## 2016-04-19 DIAGNOSIS — J3081 Allergic rhinitis due to animal (cat) (dog) hair and dander: Secondary | ICD-10-CM | POA: Diagnosis not present

## 2016-04-19 DIAGNOSIS — J3089 Other allergic rhinitis: Secondary | ICD-10-CM | POA: Diagnosis not present

## 2016-04-26 DIAGNOSIS — J301 Allergic rhinitis due to pollen: Secondary | ICD-10-CM | POA: Diagnosis not present

## 2016-04-26 DIAGNOSIS — J3081 Allergic rhinitis due to animal (cat) (dog) hair and dander: Secondary | ICD-10-CM | POA: Diagnosis not present

## 2016-04-26 DIAGNOSIS — J3089 Other allergic rhinitis: Secondary | ICD-10-CM | POA: Diagnosis not present

## 2016-05-03 DIAGNOSIS — J301 Allergic rhinitis due to pollen: Secondary | ICD-10-CM | POA: Diagnosis not present

## 2016-05-03 DIAGNOSIS — J3089 Other allergic rhinitis: Secondary | ICD-10-CM | POA: Diagnosis not present

## 2016-05-03 DIAGNOSIS — J3081 Allergic rhinitis due to animal (cat) (dog) hair and dander: Secondary | ICD-10-CM | POA: Diagnosis not present

## 2016-05-09 DIAGNOSIS — F900 Attention-deficit hyperactivity disorder, predominantly inattentive type: Secondary | ICD-10-CM | POA: Diagnosis not present

## 2016-05-10 DIAGNOSIS — J3089 Other allergic rhinitis: Secondary | ICD-10-CM | POA: Diagnosis not present

## 2016-05-10 DIAGNOSIS — J301 Allergic rhinitis due to pollen: Secondary | ICD-10-CM | POA: Diagnosis not present

## 2016-05-10 DIAGNOSIS — J3081 Allergic rhinitis due to animal (cat) (dog) hair and dander: Secondary | ICD-10-CM | POA: Diagnosis not present

## 2016-05-10 DIAGNOSIS — J4520 Mild intermittent asthma, uncomplicated: Secondary | ICD-10-CM | POA: Diagnosis not present

## 2016-05-17 DIAGNOSIS — M542 Cervicalgia: Secondary | ICD-10-CM | POA: Diagnosis not present

## 2016-05-17 DIAGNOSIS — G518 Other disorders of facial nerve: Secondary | ICD-10-CM | POA: Diagnosis not present

## 2016-05-17 DIAGNOSIS — M791 Myalgia: Secondary | ICD-10-CM | POA: Diagnosis not present

## 2016-05-17 DIAGNOSIS — G43719 Chronic migraine without aura, intractable, without status migrainosus: Secondary | ICD-10-CM | POA: Diagnosis not present

## 2016-05-30 DIAGNOSIS — F3130 Bipolar disorder, current episode depressed, mild or moderate severity, unspecified: Secondary | ICD-10-CM | POA: Diagnosis not present

## 2016-05-30 DIAGNOSIS — F4010 Social phobia, unspecified: Secondary | ICD-10-CM | POA: Diagnosis not present

## 2016-05-31 DIAGNOSIS — J3081 Allergic rhinitis due to animal (cat) (dog) hair and dander: Secondary | ICD-10-CM | POA: Diagnosis not present

## 2016-05-31 DIAGNOSIS — J3089 Other allergic rhinitis: Secondary | ICD-10-CM | POA: Diagnosis not present

## 2016-05-31 DIAGNOSIS — J301 Allergic rhinitis due to pollen: Secondary | ICD-10-CM | POA: Diagnosis not present

## 2016-06-05 DIAGNOSIS — J069 Acute upper respiratory infection, unspecified: Secondary | ICD-10-CM | POA: Diagnosis not present

## 2016-06-05 DIAGNOSIS — J209 Acute bronchitis, unspecified: Secondary | ICD-10-CM | POA: Diagnosis not present

## 2016-06-08 DIAGNOSIS — J3081 Allergic rhinitis due to animal (cat) (dog) hair and dander: Secondary | ICD-10-CM | POA: Diagnosis not present

## 2016-06-08 DIAGNOSIS — J301 Allergic rhinitis due to pollen: Secondary | ICD-10-CM | POA: Diagnosis not present

## 2016-06-09 DIAGNOSIS — J3089 Other allergic rhinitis: Secondary | ICD-10-CM | POA: Diagnosis not present

## 2016-06-16 DIAGNOSIS — Z01419 Encounter for gynecological examination (general) (routine) without abnormal findings: Secondary | ICD-10-CM | POA: Diagnosis not present

## 2016-06-16 DIAGNOSIS — Z6828 Body mass index (BMI) 28.0-28.9, adult: Secondary | ICD-10-CM | POA: Diagnosis not present

## 2016-06-21 DIAGNOSIS — J3089 Other allergic rhinitis: Secondary | ICD-10-CM | POA: Diagnosis not present

## 2016-06-21 DIAGNOSIS — J301 Allergic rhinitis due to pollen: Secondary | ICD-10-CM | POA: Diagnosis not present

## 2016-06-21 DIAGNOSIS — J3081 Allergic rhinitis due to animal (cat) (dog) hair and dander: Secondary | ICD-10-CM | POA: Diagnosis not present

## 2016-06-26 DIAGNOSIS — J301 Allergic rhinitis due to pollen: Secondary | ICD-10-CM | POA: Diagnosis not present

## 2016-06-26 DIAGNOSIS — J3089 Other allergic rhinitis: Secondary | ICD-10-CM | POA: Diagnosis not present

## 2016-06-26 DIAGNOSIS — J3081 Allergic rhinitis due to animal (cat) (dog) hair and dander: Secondary | ICD-10-CM | POA: Diagnosis not present

## 2016-06-28 DIAGNOSIS — F909 Attention-deficit hyperactivity disorder, unspecified type: Secondary | ICD-10-CM | POA: Diagnosis not present

## 2016-06-28 DIAGNOSIS — J4520 Mild intermittent asthma, uncomplicated: Secondary | ICD-10-CM | POA: Diagnosis not present

## 2016-06-28 DIAGNOSIS — J301 Allergic rhinitis due to pollen: Secondary | ICD-10-CM | POA: Diagnosis not present

## 2016-06-28 DIAGNOSIS — J3089 Other allergic rhinitis: Secondary | ICD-10-CM | POA: Diagnosis not present

## 2016-06-28 DIAGNOSIS — F3130 Bipolar disorder, current episode depressed, mild or moderate severity, unspecified: Secondary | ICD-10-CM | POA: Diagnosis not present

## 2016-06-28 DIAGNOSIS — J3081 Allergic rhinitis due to animal (cat) (dog) hair and dander: Secondary | ICD-10-CM | POA: Diagnosis not present

## 2016-06-29 DIAGNOSIS — J3081 Allergic rhinitis due to animal (cat) (dog) hair and dander: Secondary | ICD-10-CM | POA: Diagnosis not present

## 2016-06-29 DIAGNOSIS — J301 Allergic rhinitis due to pollen: Secondary | ICD-10-CM | POA: Diagnosis not present

## 2016-06-29 DIAGNOSIS — J3089 Other allergic rhinitis: Secondary | ICD-10-CM | POA: Diagnosis not present

## 2016-06-29 DIAGNOSIS — J4520 Mild intermittent asthma, uncomplicated: Secondary | ICD-10-CM | POA: Diagnosis not present

## 2016-07-03 DIAGNOSIS — J301 Allergic rhinitis due to pollen: Secondary | ICD-10-CM | POA: Diagnosis not present

## 2016-07-03 DIAGNOSIS — J3089 Other allergic rhinitis: Secondary | ICD-10-CM | POA: Diagnosis not present

## 2016-07-03 DIAGNOSIS — J3081 Allergic rhinitis due to animal (cat) (dog) hair and dander: Secondary | ICD-10-CM | POA: Diagnosis not present

## 2016-07-05 DIAGNOSIS — J3081 Allergic rhinitis due to animal (cat) (dog) hair and dander: Secondary | ICD-10-CM | POA: Diagnosis not present

## 2016-07-05 DIAGNOSIS — J3089 Other allergic rhinitis: Secondary | ICD-10-CM | POA: Diagnosis not present

## 2016-07-05 DIAGNOSIS — J301 Allergic rhinitis due to pollen: Secondary | ICD-10-CM | POA: Diagnosis not present

## 2016-07-10 DIAGNOSIS — N921 Excessive and frequent menstruation with irregular cycle: Secondary | ICD-10-CM | POA: Diagnosis not present

## 2016-07-10 DIAGNOSIS — R102 Pelvic and perineal pain: Secondary | ICD-10-CM | POA: Diagnosis not present

## 2016-07-12 DIAGNOSIS — J301 Allergic rhinitis due to pollen: Secondary | ICD-10-CM | POA: Diagnosis not present

## 2016-07-12 DIAGNOSIS — J3089 Other allergic rhinitis: Secondary | ICD-10-CM | POA: Diagnosis not present

## 2016-07-12 DIAGNOSIS — J3081 Allergic rhinitis due to animal (cat) (dog) hair and dander: Secondary | ICD-10-CM | POA: Diagnosis not present

## 2016-07-19 DIAGNOSIS — J3089 Other allergic rhinitis: Secondary | ICD-10-CM | POA: Diagnosis not present

## 2016-07-19 DIAGNOSIS — J301 Allergic rhinitis due to pollen: Secondary | ICD-10-CM | POA: Diagnosis not present

## 2016-07-19 DIAGNOSIS — J3081 Allergic rhinitis due to animal (cat) (dog) hair and dander: Secondary | ICD-10-CM | POA: Diagnosis not present

## 2016-07-26 DIAGNOSIS — J3089 Other allergic rhinitis: Secondary | ICD-10-CM | POA: Diagnosis not present

## 2016-07-26 DIAGNOSIS — J3081 Allergic rhinitis due to animal (cat) (dog) hair and dander: Secondary | ICD-10-CM | POA: Diagnosis not present

## 2016-07-26 DIAGNOSIS — J301 Allergic rhinitis due to pollen: Secondary | ICD-10-CM | POA: Diagnosis not present

## 2016-08-02 DIAGNOSIS — J3081 Allergic rhinitis due to animal (cat) (dog) hair and dander: Secondary | ICD-10-CM | POA: Diagnosis not present

## 2016-08-02 DIAGNOSIS — J301 Allergic rhinitis due to pollen: Secondary | ICD-10-CM | POA: Diagnosis not present

## 2016-08-02 DIAGNOSIS — J3089 Other allergic rhinitis: Secondary | ICD-10-CM | POA: Diagnosis not present

## 2016-08-09 DIAGNOSIS — J3089 Other allergic rhinitis: Secondary | ICD-10-CM | POA: Diagnosis not present

## 2016-08-09 DIAGNOSIS — J3081 Allergic rhinitis due to animal (cat) (dog) hair and dander: Secondary | ICD-10-CM | POA: Diagnosis not present

## 2016-08-09 DIAGNOSIS — J301 Allergic rhinitis due to pollen: Secondary | ICD-10-CM | POA: Diagnosis not present

## 2016-08-10 DIAGNOSIS — F900 Attention-deficit hyperactivity disorder, predominantly inattentive type: Secondary | ICD-10-CM | POA: Diagnosis not present

## 2016-08-15 DIAGNOSIS — G518 Other disorders of facial nerve: Secondary | ICD-10-CM | POA: Diagnosis not present

## 2016-08-15 DIAGNOSIS — G43719 Chronic migraine without aura, intractable, without status migrainosus: Secondary | ICD-10-CM | POA: Diagnosis not present

## 2016-08-15 DIAGNOSIS — M542 Cervicalgia: Secondary | ICD-10-CM | POA: Diagnosis not present

## 2016-08-15 DIAGNOSIS — M791 Myalgia: Secondary | ICD-10-CM | POA: Diagnosis not present

## 2016-08-16 DIAGNOSIS — J3089 Other allergic rhinitis: Secondary | ICD-10-CM | POA: Diagnosis not present

## 2016-08-16 DIAGNOSIS — J301 Allergic rhinitis due to pollen: Secondary | ICD-10-CM | POA: Diagnosis not present

## 2016-08-16 DIAGNOSIS — J3081 Allergic rhinitis due to animal (cat) (dog) hair and dander: Secondary | ICD-10-CM | POA: Diagnosis not present

## 2016-08-28 DIAGNOSIS — J3081 Allergic rhinitis due to animal (cat) (dog) hair and dander: Secondary | ICD-10-CM | POA: Diagnosis not present

## 2016-08-28 DIAGNOSIS — F909 Attention-deficit hyperactivity disorder, unspecified type: Secondary | ICD-10-CM | POA: Diagnosis not present

## 2016-08-28 DIAGNOSIS — J301 Allergic rhinitis due to pollen: Secondary | ICD-10-CM | POA: Diagnosis not present

## 2016-08-28 DIAGNOSIS — F3130 Bipolar disorder, current episode depressed, mild or moderate severity, unspecified: Secondary | ICD-10-CM | POA: Diagnosis not present

## 2016-08-28 DIAGNOSIS — J3089 Other allergic rhinitis: Secondary | ICD-10-CM | POA: Diagnosis not present

## 2016-08-30 DIAGNOSIS — J3081 Allergic rhinitis due to animal (cat) (dog) hair and dander: Secondary | ICD-10-CM | POA: Diagnosis not present

## 2016-08-30 DIAGNOSIS — J3089 Other allergic rhinitis: Secondary | ICD-10-CM | POA: Diagnosis not present

## 2016-08-30 DIAGNOSIS — J301 Allergic rhinitis due to pollen: Secondary | ICD-10-CM | POA: Diagnosis not present

## 2016-09-05 DIAGNOSIS — J301 Allergic rhinitis due to pollen: Secondary | ICD-10-CM | POA: Diagnosis not present

## 2016-09-05 DIAGNOSIS — J3081 Allergic rhinitis due to animal (cat) (dog) hair and dander: Secondary | ICD-10-CM | POA: Diagnosis not present

## 2016-09-05 DIAGNOSIS — J3089 Other allergic rhinitis: Secondary | ICD-10-CM | POA: Diagnosis not present

## 2016-09-05 DIAGNOSIS — F900 Attention-deficit hyperactivity disorder, predominantly inattentive type: Secondary | ICD-10-CM | POA: Diagnosis not present

## 2016-09-13 DIAGNOSIS — J3089 Other allergic rhinitis: Secondary | ICD-10-CM | POA: Diagnosis not present

## 2016-09-13 DIAGNOSIS — J3081 Allergic rhinitis due to animal (cat) (dog) hair and dander: Secondary | ICD-10-CM | POA: Diagnosis not present

## 2016-09-13 DIAGNOSIS — J301 Allergic rhinitis due to pollen: Secondary | ICD-10-CM | POA: Diagnosis not present

## 2016-09-14 DIAGNOSIS — L218 Other seborrheic dermatitis: Secondary | ICD-10-CM | POA: Diagnosis not present

## 2016-09-14 DIAGNOSIS — L2081 Atopic neurodermatitis: Secondary | ICD-10-CM | POA: Diagnosis not present

## 2016-09-14 DIAGNOSIS — L718 Other rosacea: Secondary | ICD-10-CM | POA: Diagnosis not present

## 2016-09-19 DIAGNOSIS — F900 Attention-deficit hyperactivity disorder, predominantly inattentive type: Secondary | ICD-10-CM | POA: Diagnosis not present

## 2016-09-20 DIAGNOSIS — J301 Allergic rhinitis due to pollen: Secondary | ICD-10-CM | POA: Diagnosis not present

## 2016-09-20 DIAGNOSIS — J3089 Other allergic rhinitis: Secondary | ICD-10-CM | POA: Diagnosis not present

## 2016-09-20 DIAGNOSIS — J3081 Allergic rhinitis due to animal (cat) (dog) hair and dander: Secondary | ICD-10-CM | POA: Diagnosis not present

## 2016-09-26 DIAGNOSIS — F3130 Bipolar disorder, current episode depressed, mild or moderate severity, unspecified: Secondary | ICD-10-CM | POA: Diagnosis not present

## 2016-09-26 DIAGNOSIS — F4010 Social phobia, unspecified: Secondary | ICD-10-CM | POA: Diagnosis not present

## 2016-09-26 DIAGNOSIS — F909 Attention-deficit hyperactivity disorder, unspecified type: Secondary | ICD-10-CM | POA: Diagnosis not present

## 2016-09-27 DIAGNOSIS — J301 Allergic rhinitis due to pollen: Secondary | ICD-10-CM | POA: Diagnosis not present

## 2016-09-27 DIAGNOSIS — J3089 Other allergic rhinitis: Secondary | ICD-10-CM | POA: Diagnosis not present

## 2016-09-27 DIAGNOSIS — J3081 Allergic rhinitis due to animal (cat) (dog) hair and dander: Secondary | ICD-10-CM | POA: Diagnosis not present

## 2016-10-04 DIAGNOSIS — F900 Attention-deficit hyperactivity disorder, predominantly inattentive type: Secondary | ICD-10-CM | POA: Diagnosis not present

## 2016-10-04 DIAGNOSIS — J019 Acute sinusitis, unspecified: Secondary | ICD-10-CM | POA: Diagnosis not present

## 2016-10-13 DIAGNOSIS — G43719 Chronic migraine without aura, intractable, without status migrainosus: Secondary | ICD-10-CM | POA: Diagnosis not present

## 2016-10-18 DIAGNOSIS — J3089 Other allergic rhinitis: Secondary | ICD-10-CM | POA: Diagnosis not present

## 2016-10-18 DIAGNOSIS — J301 Allergic rhinitis due to pollen: Secondary | ICD-10-CM | POA: Diagnosis not present

## 2016-10-18 DIAGNOSIS — J3081 Allergic rhinitis due to animal (cat) (dog) hair and dander: Secondary | ICD-10-CM | POA: Diagnosis not present

## 2016-10-25 DIAGNOSIS — J3089 Other allergic rhinitis: Secondary | ICD-10-CM | POA: Diagnosis not present

## 2016-10-25 DIAGNOSIS — J3081 Allergic rhinitis due to animal (cat) (dog) hair and dander: Secondary | ICD-10-CM | POA: Diagnosis not present

## 2016-10-25 DIAGNOSIS — J301 Allergic rhinitis due to pollen: Secondary | ICD-10-CM | POA: Diagnosis not present

## 2016-11-01 DIAGNOSIS — J301 Allergic rhinitis due to pollen: Secondary | ICD-10-CM | POA: Diagnosis not present

## 2016-11-01 DIAGNOSIS — J3089 Other allergic rhinitis: Secondary | ICD-10-CM | POA: Diagnosis not present

## 2016-11-01 DIAGNOSIS — J3081 Allergic rhinitis due to animal (cat) (dog) hair and dander: Secondary | ICD-10-CM | POA: Diagnosis not present

## 2016-11-02 DIAGNOSIS — F900 Attention-deficit hyperactivity disorder, predominantly inattentive type: Secondary | ICD-10-CM | POA: Diagnosis not present

## 2016-11-08 DIAGNOSIS — J301 Allergic rhinitis due to pollen: Secondary | ICD-10-CM | POA: Diagnosis not present

## 2016-11-08 DIAGNOSIS — F3130 Bipolar disorder, current episode depressed, mild or moderate severity, unspecified: Secondary | ICD-10-CM | POA: Diagnosis not present

## 2016-11-08 DIAGNOSIS — F4010 Social phobia, unspecified: Secondary | ICD-10-CM | POA: Diagnosis not present

## 2016-11-08 DIAGNOSIS — J3081 Allergic rhinitis due to animal (cat) (dog) hair and dander: Secondary | ICD-10-CM | POA: Diagnosis not present

## 2016-11-08 DIAGNOSIS — F909 Attention-deficit hyperactivity disorder, unspecified type: Secondary | ICD-10-CM | POA: Diagnosis not present

## 2016-11-08 DIAGNOSIS — J3089 Other allergic rhinitis: Secondary | ICD-10-CM | POA: Diagnosis not present

## 2016-11-13 DIAGNOSIS — G43719 Chronic migraine without aura, intractable, without status migrainosus: Secondary | ICD-10-CM | POA: Diagnosis not present

## 2016-11-15 DIAGNOSIS — J3081 Allergic rhinitis due to animal (cat) (dog) hair and dander: Secondary | ICD-10-CM | POA: Diagnosis not present

## 2016-11-15 DIAGNOSIS — J301 Allergic rhinitis due to pollen: Secondary | ICD-10-CM | POA: Diagnosis not present

## 2016-11-15 DIAGNOSIS — J3089 Other allergic rhinitis: Secondary | ICD-10-CM | POA: Diagnosis not present

## 2016-11-22 DIAGNOSIS — J3081 Allergic rhinitis due to animal (cat) (dog) hair and dander: Secondary | ICD-10-CM | POA: Diagnosis not present

## 2016-11-22 DIAGNOSIS — J301 Allergic rhinitis due to pollen: Secondary | ICD-10-CM | POA: Diagnosis not present

## 2016-11-22 DIAGNOSIS — J3089 Other allergic rhinitis: Secondary | ICD-10-CM | POA: Diagnosis not present

## 2016-11-27 DIAGNOSIS — G518 Other disorders of facial nerve: Secondary | ICD-10-CM | POA: Diagnosis not present

## 2016-11-27 DIAGNOSIS — M542 Cervicalgia: Secondary | ICD-10-CM | POA: Diagnosis not present

## 2016-11-27 DIAGNOSIS — G43719 Chronic migraine without aura, intractable, without status migrainosus: Secondary | ICD-10-CM | POA: Diagnosis not present

## 2016-11-27 DIAGNOSIS — M791 Myalgia: Secondary | ICD-10-CM | POA: Diagnosis not present

## 2016-11-29 DIAGNOSIS — F900 Attention-deficit hyperactivity disorder, predominantly inattentive type: Secondary | ICD-10-CM | POA: Diagnosis not present

## 2016-12-06 DIAGNOSIS — J3089 Other allergic rhinitis: Secondary | ICD-10-CM | POA: Diagnosis not present

## 2016-12-06 DIAGNOSIS — J301 Allergic rhinitis due to pollen: Secondary | ICD-10-CM | POA: Diagnosis not present

## 2016-12-06 DIAGNOSIS — J3081 Allergic rhinitis due to animal (cat) (dog) hair and dander: Secondary | ICD-10-CM | POA: Diagnosis not present

## 2016-12-13 DIAGNOSIS — G43719 Chronic migraine without aura, intractable, without status migrainosus: Secondary | ICD-10-CM | POA: Diagnosis not present

## 2016-12-20 DIAGNOSIS — J301 Allergic rhinitis due to pollen: Secondary | ICD-10-CM | POA: Diagnosis not present

## 2016-12-20 DIAGNOSIS — J3081 Allergic rhinitis due to animal (cat) (dog) hair and dander: Secondary | ICD-10-CM | POA: Diagnosis not present

## 2016-12-20 DIAGNOSIS — J3089 Other allergic rhinitis: Secondary | ICD-10-CM | POA: Diagnosis not present

## 2016-12-21 DIAGNOSIS — H5010 Unspecified exotropia: Secondary | ICD-10-CM | POA: Diagnosis not present

## 2016-12-21 DIAGNOSIS — H52203 Unspecified astigmatism, bilateral: Secondary | ICD-10-CM | POA: Diagnosis not present

## 2016-12-21 DIAGNOSIS — H5213 Myopia, bilateral: Secondary | ICD-10-CM | POA: Diagnosis not present

## 2016-12-28 DIAGNOSIS — F900 Attention-deficit hyperactivity disorder, predominantly inattentive type: Secondary | ICD-10-CM | POA: Diagnosis not present

## 2016-12-29 DIAGNOSIS — F4010 Social phobia, unspecified: Secondary | ICD-10-CM | POA: Diagnosis not present

## 2016-12-29 DIAGNOSIS — F3130 Bipolar disorder, current episode depressed, mild or moderate severity, unspecified: Secondary | ICD-10-CM | POA: Diagnosis not present

## 2016-12-29 DIAGNOSIS — F909 Attention-deficit hyperactivity disorder, unspecified type: Secondary | ICD-10-CM | POA: Diagnosis not present

## 2017-01-01 DIAGNOSIS — J4520 Mild intermittent asthma, uncomplicated: Secondary | ICD-10-CM | POA: Diagnosis not present

## 2017-01-01 DIAGNOSIS — J3081 Allergic rhinitis due to animal (cat) (dog) hair and dander: Secondary | ICD-10-CM | POA: Diagnosis not present

## 2017-01-01 DIAGNOSIS — J301 Allergic rhinitis due to pollen: Secondary | ICD-10-CM | POA: Diagnosis not present

## 2017-01-01 DIAGNOSIS — J019 Acute sinusitis, unspecified: Secondary | ICD-10-CM | POA: Diagnosis not present

## 2017-01-17 DIAGNOSIS — G43719 Chronic migraine without aura, intractable, without status migrainosus: Secondary | ICD-10-CM | POA: Diagnosis not present

## 2017-01-17 DIAGNOSIS — J3089 Other allergic rhinitis: Secondary | ICD-10-CM | POA: Diagnosis not present

## 2017-01-17 DIAGNOSIS — J301 Allergic rhinitis due to pollen: Secondary | ICD-10-CM | POA: Diagnosis not present

## 2017-01-17 DIAGNOSIS — J3081 Allergic rhinitis due to animal (cat) (dog) hair and dander: Secondary | ICD-10-CM | POA: Diagnosis not present

## 2017-01-23 DIAGNOSIS — Z23 Encounter for immunization: Secondary | ICD-10-CM | POA: Diagnosis not present

## 2017-01-30 DIAGNOSIS — F900 Attention-deficit hyperactivity disorder, predominantly inattentive type: Secondary | ICD-10-CM | POA: Diagnosis not present

## 2017-01-31 DIAGNOSIS — J301 Allergic rhinitis due to pollen: Secondary | ICD-10-CM | POA: Diagnosis not present

## 2017-01-31 DIAGNOSIS — J3081 Allergic rhinitis due to animal (cat) (dog) hair and dander: Secondary | ICD-10-CM | POA: Diagnosis not present

## 2017-01-31 DIAGNOSIS — J3089 Other allergic rhinitis: Secondary | ICD-10-CM | POA: Diagnosis not present

## 2017-02-09 DIAGNOSIS — J301 Allergic rhinitis due to pollen: Secondary | ICD-10-CM | POA: Diagnosis not present

## 2017-02-09 DIAGNOSIS — J3081 Allergic rhinitis due to animal (cat) (dog) hair and dander: Secondary | ICD-10-CM | POA: Diagnosis not present

## 2017-02-09 DIAGNOSIS — J3089 Other allergic rhinitis: Secondary | ICD-10-CM | POA: Diagnosis not present

## 2017-02-14 DIAGNOSIS — J3081 Allergic rhinitis due to animal (cat) (dog) hair and dander: Secondary | ICD-10-CM | POA: Diagnosis not present

## 2017-02-14 DIAGNOSIS — J3089 Other allergic rhinitis: Secondary | ICD-10-CM | POA: Diagnosis not present

## 2017-02-14 DIAGNOSIS — J301 Allergic rhinitis due to pollen: Secondary | ICD-10-CM | POA: Diagnosis not present

## 2017-02-15 DIAGNOSIS — G43719 Chronic migraine without aura, intractable, without status migrainosus: Secondary | ICD-10-CM | POA: Diagnosis not present

## 2017-02-16 DIAGNOSIS — F4010 Social phobia, unspecified: Secondary | ICD-10-CM | POA: Diagnosis not present

## 2017-02-16 DIAGNOSIS — F909 Attention-deficit hyperactivity disorder, unspecified type: Secondary | ICD-10-CM | POA: Diagnosis not present

## 2017-02-16 DIAGNOSIS — F3130 Bipolar disorder, current episode depressed, mild or moderate severity, unspecified: Secondary | ICD-10-CM | POA: Diagnosis not present

## 2017-02-20 DIAGNOSIS — M542 Cervicalgia: Secondary | ICD-10-CM | POA: Diagnosis not present

## 2017-02-20 DIAGNOSIS — G518 Other disorders of facial nerve: Secondary | ICD-10-CM | POA: Diagnosis not present

## 2017-02-20 DIAGNOSIS — M791 Myalgia, unspecified site: Secondary | ICD-10-CM | POA: Diagnosis not present

## 2017-02-20 DIAGNOSIS — G43719 Chronic migraine without aura, intractable, without status migrainosus: Secondary | ICD-10-CM | POA: Diagnosis not present

## 2017-02-21 DIAGNOSIS — J3089 Other allergic rhinitis: Secondary | ICD-10-CM | POA: Diagnosis not present

## 2017-02-21 DIAGNOSIS — J3081 Allergic rhinitis due to animal (cat) (dog) hair and dander: Secondary | ICD-10-CM | POA: Diagnosis not present

## 2017-02-21 DIAGNOSIS — J301 Allergic rhinitis due to pollen: Secondary | ICD-10-CM | POA: Diagnosis not present

## 2017-02-22 DIAGNOSIS — F900 Attention-deficit hyperactivity disorder, predominantly inattentive type: Secondary | ICD-10-CM | POA: Diagnosis not present

## 2017-02-28 DIAGNOSIS — J301 Allergic rhinitis due to pollen: Secondary | ICD-10-CM | POA: Diagnosis not present

## 2017-02-28 DIAGNOSIS — J3081 Allergic rhinitis due to animal (cat) (dog) hair and dander: Secondary | ICD-10-CM | POA: Diagnosis not present

## 2017-02-28 DIAGNOSIS — J3089 Other allergic rhinitis: Secondary | ICD-10-CM | POA: Diagnosis not present

## 2017-03-07 DIAGNOSIS — J3089 Other allergic rhinitis: Secondary | ICD-10-CM | POA: Diagnosis not present

## 2017-03-07 DIAGNOSIS — J301 Allergic rhinitis due to pollen: Secondary | ICD-10-CM | POA: Diagnosis not present

## 2017-03-07 DIAGNOSIS — J3081 Allergic rhinitis due to animal (cat) (dog) hair and dander: Secondary | ICD-10-CM | POA: Diagnosis not present

## 2017-03-20 DIAGNOSIS — G43719 Chronic migraine without aura, intractable, without status migrainosus: Secondary | ICD-10-CM | POA: Diagnosis not present

## 2017-03-21 DIAGNOSIS — J3089 Other allergic rhinitis: Secondary | ICD-10-CM | POA: Diagnosis not present

## 2017-03-21 DIAGNOSIS — J301 Allergic rhinitis due to pollen: Secondary | ICD-10-CM | POA: Diagnosis not present

## 2017-03-21 DIAGNOSIS — J3081 Allergic rhinitis due to animal (cat) (dog) hair and dander: Secondary | ICD-10-CM | POA: Diagnosis not present

## 2017-03-28 DIAGNOSIS — J3089 Other allergic rhinitis: Secondary | ICD-10-CM | POA: Diagnosis not present

## 2017-03-28 DIAGNOSIS — J3081 Allergic rhinitis due to animal (cat) (dog) hair and dander: Secondary | ICD-10-CM | POA: Diagnosis not present

## 2017-03-28 DIAGNOSIS — J301 Allergic rhinitis due to pollen: Secondary | ICD-10-CM | POA: Diagnosis not present

## 2017-03-29 DIAGNOSIS — F900 Attention-deficit hyperactivity disorder, predominantly inattentive type: Secondary | ICD-10-CM | POA: Diagnosis not present

## 2017-03-30 DIAGNOSIS — F909 Attention-deficit hyperactivity disorder, unspecified type: Secondary | ICD-10-CM | POA: Diagnosis not present

## 2017-03-30 DIAGNOSIS — F3130 Bipolar disorder, current episode depressed, mild or moderate severity, unspecified: Secondary | ICD-10-CM | POA: Diagnosis not present

## 2017-03-30 DIAGNOSIS — F4010 Social phobia, unspecified: Secondary | ICD-10-CM | POA: Diagnosis not present

## 2017-04-05 DIAGNOSIS — J301 Allergic rhinitis due to pollen: Secondary | ICD-10-CM | POA: Diagnosis not present

## 2017-04-05 DIAGNOSIS — J3081 Allergic rhinitis due to animal (cat) (dog) hair and dander: Secondary | ICD-10-CM | POA: Diagnosis not present

## 2017-04-05 DIAGNOSIS — J3089 Other allergic rhinitis: Secondary | ICD-10-CM | POA: Diagnosis not present

## 2017-04-13 DIAGNOSIS — Z5181 Encounter for therapeutic drug level monitoring: Secondary | ICD-10-CM | POA: Diagnosis not present

## 2017-04-18 DIAGNOSIS — J3081 Allergic rhinitis due to animal (cat) (dog) hair and dander: Secondary | ICD-10-CM | POA: Diagnosis not present

## 2017-04-18 DIAGNOSIS — J3089 Other allergic rhinitis: Secondary | ICD-10-CM | POA: Diagnosis not present

## 2017-04-18 DIAGNOSIS — J301 Allergic rhinitis due to pollen: Secondary | ICD-10-CM | POA: Diagnosis not present

## 2017-04-19 DIAGNOSIS — G43719 Chronic migraine without aura, intractable, without status migrainosus: Secondary | ICD-10-CM | POA: Diagnosis not present

## 2017-04-25 DIAGNOSIS — J3081 Allergic rhinitis due to animal (cat) (dog) hair and dander: Secondary | ICD-10-CM | POA: Diagnosis not present

## 2017-04-25 DIAGNOSIS — J3089 Other allergic rhinitis: Secondary | ICD-10-CM | POA: Diagnosis not present

## 2017-04-25 DIAGNOSIS — J301 Allergic rhinitis due to pollen: Secondary | ICD-10-CM | POA: Diagnosis not present

## 2017-05-11 DIAGNOSIS — F4010 Social phobia, unspecified: Secondary | ICD-10-CM | POA: Diagnosis not present

## 2017-05-11 DIAGNOSIS — F909 Attention-deficit hyperactivity disorder, unspecified type: Secondary | ICD-10-CM | POA: Diagnosis not present

## 2017-05-11 DIAGNOSIS — F3130 Bipolar disorder, current episode depressed, mild or moderate severity, unspecified: Secondary | ICD-10-CM | POA: Diagnosis not present

## 2017-05-16 DIAGNOSIS — J3089 Other allergic rhinitis: Secondary | ICD-10-CM | POA: Diagnosis not present

## 2017-05-16 DIAGNOSIS — J301 Allergic rhinitis due to pollen: Secondary | ICD-10-CM | POA: Diagnosis not present

## 2017-05-16 DIAGNOSIS — J3081 Allergic rhinitis due to animal (cat) (dog) hair and dander: Secondary | ICD-10-CM | POA: Diagnosis not present

## 2017-05-17 DIAGNOSIS — L638 Other alopecia areata: Secondary | ICD-10-CM | POA: Diagnosis not present

## 2017-05-17 DIAGNOSIS — L218 Other seborrheic dermatitis: Secondary | ICD-10-CM | POA: Diagnosis not present

## 2017-05-18 DIAGNOSIS — G43719 Chronic migraine without aura, intractable, without status migrainosus: Secondary | ICD-10-CM | POA: Diagnosis not present

## 2017-05-22 ENCOUNTER — Encounter: Payer: Self-pay | Admitting: Neurology

## 2017-05-22 DIAGNOSIS — G43719 Chronic migraine without aura, intractable, without status migrainosus: Secondary | ICD-10-CM | POA: Diagnosis not present

## 2017-05-22 DIAGNOSIS — G518 Other disorders of facial nerve: Secondary | ICD-10-CM | POA: Diagnosis not present

## 2017-05-22 DIAGNOSIS — M542 Cervicalgia: Secondary | ICD-10-CM | POA: Diagnosis not present

## 2017-05-22 DIAGNOSIS — M791 Myalgia, unspecified site: Secondary | ICD-10-CM | POA: Diagnosis not present

## 2017-05-23 ENCOUNTER — Encounter: Payer: Self-pay | Admitting: Neurology

## 2017-05-23 ENCOUNTER — Ambulatory Visit: Payer: BLUE CROSS/BLUE SHIELD | Admitting: Neurology

## 2017-05-23 VITALS — BP 143/87 | HR 107 | Resp 20 | Ht 61.0 in | Wt 174.0 lb

## 2017-05-23 DIAGNOSIS — E669 Obesity, unspecified: Secondary | ICD-10-CM | POA: Diagnosis not present

## 2017-05-23 DIAGNOSIS — J3081 Allergic rhinitis due to animal (cat) (dog) hair and dander: Secondary | ICD-10-CM | POA: Diagnosis not present

## 2017-05-23 DIAGNOSIS — R51 Headache: Secondary | ICD-10-CM | POA: Diagnosis not present

## 2017-05-23 DIAGNOSIS — F99 Mental disorder, not otherwise specified: Secondary | ICD-10-CM | POA: Diagnosis not present

## 2017-05-23 DIAGNOSIS — F5105 Insomnia due to other mental disorder: Secondary | ICD-10-CM

## 2017-05-23 DIAGNOSIS — G4719 Other hypersomnia: Secondary | ICD-10-CM | POA: Diagnosis not present

## 2017-05-23 DIAGNOSIS — R0683 Snoring: Secondary | ICD-10-CM | POA: Diagnosis not present

## 2017-05-23 DIAGNOSIS — G478 Other sleep disorders: Secondary | ICD-10-CM

## 2017-05-23 DIAGNOSIS — Z6830 Body mass index (BMI) 30.0-30.9, adult: Secondary | ICD-10-CM | POA: Insufficient documentation

## 2017-05-23 DIAGNOSIS — J301 Allergic rhinitis due to pollen: Secondary | ICD-10-CM | POA: Diagnosis not present

## 2017-05-23 DIAGNOSIS — E66811 Obesity, class 1: Secondary | ICD-10-CM

## 2017-05-23 DIAGNOSIS — J3089 Other allergic rhinitis: Secondary | ICD-10-CM | POA: Diagnosis not present

## 2017-05-23 DIAGNOSIS — R519 Headache, unspecified: Secondary | ICD-10-CM

## 2017-05-23 DIAGNOSIS — J4521 Mild intermittent asthma with (acute) exacerbation: Secondary | ICD-10-CM

## 2017-05-23 NOTE — Patient Instructions (Signed)

## 2017-05-23 NOTE — Progress Notes (Addendum)
SLEEP MEDICINE CLINIC   Provider:  Melvyn Novas, M D  Primary Care Physician:  Patient, No Pcp Per   Referring Provider: Albertina Parr, NP     Chief Complaint  Patient presents with  . New Patient (Initial Visit)    rm 11, pt alone, states that she has a hard time with falling asleep and then she wakes up in the night frequently, pt states that she snores and feels tired when waking up. she finds herself getting irritable real easily    HPI:  [name] Park is a 42 y.o. female , seen here as in a referral  from NP O'Neal and here for a sleep evaluation.   The patient carries the diagnosis of bipolar disorder type 2 , ADD, OCD, obesity, frequent sinusitis, and nasal congestion and visibly very small lower jaw. Hypoplastic sinus structures, and longstanding sleep problems.  Chief complaint according to patient :Trouble falling asleep,her thoughts would be racing, and she would be anxious and unable to relax.  Seroquel and melatonin are used for sleep induction.  She wakes up frequently at night, and in the morning is not well rested.   Sleep habits are as follows: She is OCD and has a sleep ritual- usually takes medication at 9 Pm and 11 Pm the second dose. She watches Tv until 11.30, goes upstairs and undresses, showers, goes to bed ( but with the TV or radio on), falling asleep between midnight and 12.30 AM.  The patient lives alone and sleeps alone, her bedroom is usually dark after the TV is off, not quiet because she craves some background noise, and she feels that this suppresses some of her intruding thoughts.  She has a low bright lights on next to her bed, the bedroom is cool. Once asleep she usually sleeps for about 2-3 hours before she wakes up for the first time, unrelated to any urge to go to the bathroom. She is not sure what triggers these arousals.  If she is able to go back to sleep she will usually sleep for another 30 minutes or even 120 minutes and she is not  sure what predisposes her to either short or longer sleep cycles.  She frequently dreams and she can often remember her dreams but they are not necessarily nightmares or upsetting.  It does not sound as if the dreams wake her. She often wakes up spontaneously just before her alarm would be set, alarm is set at  8.30 for medication , and 10.30 AM ! To rise. On weekends she rises even later - at noon .  Work hours from 12 noon  to 16.00 hours.  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 She is on her feet a lot.  Usually doesn't nap in daytime.  It has sometimes happened   Sleep medical history and family sleep history:  Father with OSA on CPAP , frequent respiratory infections. Pneumonia.  She has similar pres disposition to bronchiolitis, sinusitis, pneumonia. Her Insomnia started in teenage and progressed in college. Racing , intruding thoughts. .    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  The patient is a non-smoker nontobacco user, she consumes alcohol usually in form of wine or beer not hard liquor.  She will drink up to 2 drinks on special occasions.  Caffeine is used in form of a large cup of coffee during the work week in the morning and up to  24 ounces of coffee or tea a day.  Review of Systems: Out of a complete 14 system review, the patient complains of only the following symptoms, and all other reviewed systems are negative.  Snoring ( witnessed by family, friends ) , apnea, waking up choking, waking up from noises.  She endorsed fatigue, a tendency to have skin rash, allergies, weight gain, headaches, snoring depression and anxiety OCD the feeling of not getting enough sleep and decreased energy and daytime, racing thoughts she carries a diagnosis of bipolar disease anxiety OCD and ADD allergies with upper respiratory manifestations, as well as pneumonitis, polycystic ovarian disease, hypothyroidism, chronic  migraines and is diagnosed with depression and anxiety bipolar type II.   I am looking at the sleepiness score next :   Epworth Sleepiness score 10/ 24  , Fatigue severity score  46 out of 63.   , depression score PHQ 9 - 12 points -she feels stabilized, "in a good place "   Social History   Socioeconomic History  . Marital status: Single    Spouse name: Not on file  . Number of children: Not on file  . Years of education: Not on file  . Highest education level: Not on file  Social Needs  . Financial resource strain: Not on file  . Food insecurity - worry: Not on file  . Food insecurity - inability: Not on file  . Transportation needs - medical: Not on file  . Transportation needs - non-medical: Not on file  Occupational History  . Not on file  Tobacco Use  . Smoking status: Never Smoker  . Smokeless tobacco: Never Used  Substance and Sexual Activity  . Alcohol use: Yes    Alcohol/week: 4.2 oz    Types: 7 Glasses of wine per week    Comment: up to 12 oz daily of wine or beer.  . Drug use: No  . Sexual activity: Not Currently    Birth control/protection: Pill  Other Topics Concern  . Not on file  Social History Narrative  . Not on file    No family history on file.  Past Medical History:  Diagnosis Date  . ADHD (attention deficit hyperactivity disorder)   . Anxiety   . Bipolar 1 disorder (HCC)   . Carpal tunnel syndrome, bilateral   . Environmental allergies   . Migraine   . OCD (obsessive compulsive disorder)   . Polycystic ovarian disease     Past Surgical History:  Procedure Laterality Date  . EYE SURGERY    . TONSILECTOMY, ADENOIDECTOMY, BILATERAL MYRINGOTOMY AND TUBES    . TONSILLECTOMY      Current Outpatient Medications  Medication Sig Dispense Refill  . albuterol (PROVENTIL HFA;VENTOLIN HFA) 108 (90 BASE) MCG/ACT inhaler Inhale 2 puffs into the lungs every 6 (six) hours as needed for wheezing.    . Ascorbic Acid (VITAMIN C) 1000 MG tablet Take  1,000 mg by mouth daily.    . baclofen (LIORESAL) 10 MG tablet Take 10 mg by mouth daily as needed (for migraines). Only allowed Monday through Friday as needed    . cetirizine (ZYRTEC) 10 MG tablet Take 10 mg by mouth daily.    . chlorproMAZINE (THORAZINE) 25 MG tablet Take 25 mg by mouth daily as needed.     Marland Kitchen. EMGALITY 120 MG/ML SOAJ   0  . fluticasone (VERAMYST) 27.5 MCG/SPRAY nasal spray Place 2 sprays into the nose daily as needed for allergies.    Marland Kitchen. ibuprofen (ADVIL,MOTRIN) 200  MG tablet Take 400 mg by mouth every 6 (six) hours as needed (for back pain).    . Melatonin 3 MG TABS Take 9 mg by mouth at bedtime.    . metFORMIN (GLUCOPHAGE) 500 MG tablet Take 500 mg by mouth 2 (two) times daily with a meal.     . montelukast (SINGULAIR) 10 MG tablet Take 10 mg by mouth every evening.    Marland Kitchen PRESCRIPTION MEDICATION Apply 1 application topically. For facial rosacea    . QUEtiapine (SEROQUEL) 400 MG tablet Take 100 mg by mouth at bedtime. Take 2 at bedtime    . ranitidine (ZANTAC) 150 MG tablet   4  . spironolactone (ALDACTONE) 100 MG tablet Take 50 mg by mouth daily.    Marland Kitchen SYNTHROID 50 MCG tablet   0   No current facility-administered medications for this visit.     Allergies as of 05/23/2017 - Review Complete 05/23/2017  Allergen Reaction Noted  . Prednisone  05/22/2017  . Latex Rash 11/22/2012    Vitals: BP (!) 143/87   Pulse (!) 107   Ht 5\' 1"  (1.549 m)   Wt 174 lb (78.9 kg)   BMI 32.88 kg/m  Last Weight:  Wt Readings from Last 1 Encounters:  05/23/17 174 lb (78.9 kg)   ZOX:WRUE mass index is 32.88 kg/m.     Last Height:   Ht Readings from Last 1 Encounters:  05/23/17 5\' 1"  (1.549 m)   RR is 18 at rest, audible breathing, mouth breathing.  Physical exam:  General: The patient is awake, alert and appears not in acute distress. The patient is well groomed. Head: Normocephalic, atraumatic. Neck is supple. Mallampati 5,  neck circumference:17.5 . Short neck, thick neck- Nasal  airflow restricted, TMJ clickevident . Retrognathia is not seen, but a b very crowded , small lower jaw. Used to wear braces and retainers  Age 73 - 18. Mouth guard for bruxism since 2002.   Cardiovascular:  Regular rate and rhythm , without  murmurs or carotid bruit, and without distended neck veins. Respiratory: Lungs are clear to auscultation. Skin:  Without evidence of edema, or rash Trunk: BMI is 33, abdominal obesity- more apple shaped.  The patient's posture is erect   Neurologic exam : The patient is awake and alert, oriented to place and time.   Memory subjective  described as intact.   Attention span & concentration ability appears normal.  Speech is fluent,  Her Mood and affect are appropriate.  Cranial nerves: Pupils are equal and briskly reactive to light. Funduscopic exam without  evidence of pallor or edema. Extraocular movements  in vertical and horizontal planes intact and without nystagmus. Visual fields by finger perimetry are intact. Hearing to finger rub intact.  Facial sensation intact to fine touch. Facial motor strength is symmetric and tongue and uvula move midline. Shoulder shrug was symmetrical.   Motor exam:   Normal tone, muscle bulk and symmetric strength in all extremities. Sensory:  Fine touch, pinprick and vibration were tested in all extremities. Proprioception tested in the upper extremities was normal. Coordination: Rapid alternating movements in the fingers/hands was normal. Finger-to-nose maneuver  normal without evidence of ataxia, dysmetria or tremor. Gait and station: Patient walks without assistive device.Tandem gait is unfragmented. Turns with  3-4  Steps in either direction. Romberg testing is negative. Deep tendon reflexes: in the  upper and lower extremities are symmetric and intact. Babinski maneuver response is downgoing.    Medication reviewed, med hx reviewed. PCOS,  hypothyroidism. contributed to weight gain.  The patient takes Seroquel 400  mg tablets 2 at night, metformin 500 mg 3 times a day, Zyrtec 10 mg at night and Singulair 10 mg at night spironolactone 100 mg at night, AcipHex 20 mg twice a day baclofen 10 mg daily and thyroxine 5 times a week as needed I am awake injecting monthly that was exchanged for MD allergy every 30 days.  Allergy shots once a week.   Assessment:  After physical and neurologic examination, review of laboratory studies,  Personal review of imaging studies, reports of other /same  Imaging studies, results of polysomnography and / or neurophysiology testing and pre-existing records as far as provided in visit., my assessment is   1) Insomnia and prolonged sleep time, and very high risk for sleep apnea, co-morbidities for insomnia are  Psychological, and poor sleep hygiene.  EDS and fatigue can be related to medication, but likely OSA is present, there a is a strong family history. Asthma, respiratory infection and inflammation, and a habitual mouth breather. She is obese - truncal - chest and abdomen- likely med induced.    2) tension headaches treated with baclofen and pain medications. Bruxism and TMJ - all contributing to HA, [facility]   3) chronic migraine since teenage , treated by Dr. Neale Burly, curently on Emgality. Failed Aimovig .   4) Insomnia started with bipolar disorder manifestation in teenage - and her headaches ,too. Establish better sleep routines.  I gave patient the 14 day sleep boot camp hand out and we discussed in detail.    The patient was advised of the nature of the diagnosed disorder , the treatment options and the  risks for general health and wellness arising from not treating the condition.   I spent more than  60 minutes of face to face time with the patient.  Greater than 50% of time was spent in counseling and coordination of care. We have discussed the diagnosis and differential and I answered the patient's questions.    Plan:  Treatment  plan and additional workup :  Sleep hygiene,  SPLIT night PSG, Headaches are present in AM- please use capnography- patient cannot breath through nose, evne with a netty pot.  CO2 above 50 torr, peak CO@ in REM and NREM sleep to be recorded.  She has very great trouble with nasal airway obstruction-  PS:  if she tests positive for OSA , CPAP may not work for her (and in that case we proceed to [facility] if needed).    Melvyn Novas, MD 05/23/2017, 9:06 AM  Certified in Neurology by ABPN Certified in Sleep Medicine by [facility] [address] [place], [place] [address]

## 2017-05-30 DIAGNOSIS — J3089 Other allergic rhinitis: Secondary | ICD-10-CM | POA: Diagnosis not present

## 2017-05-30 DIAGNOSIS — J301 Allergic rhinitis due to pollen: Secondary | ICD-10-CM | POA: Diagnosis not present

## 2017-05-30 DIAGNOSIS — J3081 Allergic rhinitis due to animal (cat) (dog) hair and dander: Secondary | ICD-10-CM | POA: Diagnosis not present

## 2017-06-13 DIAGNOSIS — J3089 Other allergic rhinitis: Secondary | ICD-10-CM | POA: Diagnosis not present

## 2017-06-13 DIAGNOSIS — J3081 Allergic rhinitis due to animal (cat) (dog) hair and dander: Secondary | ICD-10-CM | POA: Diagnosis not present

## 2017-06-13 DIAGNOSIS — J301 Allergic rhinitis due to pollen: Secondary | ICD-10-CM | POA: Diagnosis not present

## 2017-06-19 DIAGNOSIS — J3081 Allergic rhinitis due to animal (cat) (dog) hair and dander: Secondary | ICD-10-CM | POA: Diagnosis not present

## 2017-06-19 DIAGNOSIS — J301 Allergic rhinitis due to pollen: Secondary | ICD-10-CM | POA: Diagnosis not present

## 2017-06-19 DIAGNOSIS — G43719 Chronic migraine without aura, intractable, without status migrainosus: Secondary | ICD-10-CM | POA: Diagnosis not present

## 2017-06-19 DIAGNOSIS — J3089 Other allergic rhinitis: Secondary | ICD-10-CM | POA: Diagnosis not present

## 2017-06-24 ENCOUNTER — Ambulatory Visit (INDEPENDENT_AMBULATORY_CARE_PROVIDER_SITE_OTHER): Payer: BLUE CROSS/BLUE SHIELD | Admitting: Neurology

## 2017-06-24 DIAGNOSIS — E669 Obesity, unspecified: Secondary | ICD-10-CM

## 2017-06-24 DIAGNOSIS — R0683 Snoring: Secondary | ICD-10-CM

## 2017-06-24 DIAGNOSIS — G478 Other sleep disorders: Secondary | ICD-10-CM

## 2017-06-24 DIAGNOSIS — J4521 Mild intermittent asthma with (acute) exacerbation: Secondary | ICD-10-CM

## 2017-06-24 DIAGNOSIS — G4733 Obstructive sleep apnea (adult) (pediatric): Secondary | ICD-10-CM

## 2017-06-24 DIAGNOSIS — F5105 Insomnia due to other mental disorder: Secondary | ICD-10-CM

## 2017-06-24 DIAGNOSIS — G4719 Other hypersomnia: Secondary | ICD-10-CM

## 2017-06-24 DIAGNOSIS — F99 Mental disorder, not otherwise specified: Secondary | ICD-10-CM

## 2017-06-24 DIAGNOSIS — R519 Headache, unspecified: Secondary | ICD-10-CM

## 2017-06-24 DIAGNOSIS — R51 Headache: Secondary | ICD-10-CM

## 2017-06-26 DIAGNOSIS — L2084 Intrinsic (allergic) eczema: Secondary | ICD-10-CM | POA: Diagnosis not present

## 2017-06-26 DIAGNOSIS — L218 Other seborrheic dermatitis: Secondary | ICD-10-CM | POA: Diagnosis not present

## 2017-06-26 DIAGNOSIS — L638 Other alopecia areata: Secondary | ICD-10-CM | POA: Diagnosis not present

## 2017-06-27 DIAGNOSIS — J3081 Allergic rhinitis due to animal (cat) (dog) hair and dander: Secondary | ICD-10-CM | POA: Diagnosis not present

## 2017-06-27 DIAGNOSIS — J301 Allergic rhinitis due to pollen: Secondary | ICD-10-CM | POA: Diagnosis not present

## 2017-06-27 DIAGNOSIS — J3089 Other allergic rhinitis: Secondary | ICD-10-CM | POA: Diagnosis not present

## 2017-07-05 DIAGNOSIS — Z5181 Encounter for therapeutic drug level monitoring: Secondary | ICD-10-CM | POA: Diagnosis not present

## 2017-07-06 DIAGNOSIS — F4010 Social phobia, unspecified: Secondary | ICD-10-CM | POA: Diagnosis not present

## 2017-07-06 DIAGNOSIS — F3130 Bipolar disorder, current episode depressed, mild or moderate severity, unspecified: Secondary | ICD-10-CM | POA: Diagnosis not present

## 2017-07-06 DIAGNOSIS — F909 Attention-deficit hyperactivity disorder, unspecified type: Secondary | ICD-10-CM | POA: Diagnosis not present

## 2017-07-09 NOTE — Addendum Note (Signed)
Addended by: Melvyn Novas on: 07/09/2017 05:18 PM   Modules accepted: Orders

## 2017-07-09 NOTE — Procedures (Signed)
PATIENT'S NAME:  [name] Park, [name] Park DOB:      06/11/1975      MR#:    [id number]     DATE OF RECORDING: 06/24/2017 REFERRING M.D.:  Lauris Poag, NP  Study Performed:   Baseline Polysomnogram HISTORY:  This 42 year old Caucasian female presented with insomnia and excessive fatigue, EDS.  Positive family history of OSA and lower jaw hypoplasia. ADHD, Anxiety, Bipolar 1 disorder, Carpel tunnel syndrome, Environmental allergies, Migraines, Obsessive compulsive disorder, Polycystic ovarian disorder.   The patient endorsed the Epworth Sleepiness Scale at 10/24 points, FSS at 46 points, PHQ9:12 points. .   The patient's weight 174 pounds with a height of 61 (inches), resulting in a BMI of 32.9 kg/m2 and her neck circumference measured 17.5 inches.  CURRENT MEDICATIONS: Albuterol, Lioresal, Vitamin C, Zyrtec, Thorazine, Veramyst, Advil, Melatonin, Glucophage, Singulair, Seroquel, Zantac, Aldactone, Synthroid.   PROCEDURE:  This is a multichannel digital polysomnogram utilizing the SomnoStar 11.2 system.  Electrodes and sensors were applied and monitored per AASM Specifications.   EEG, EOG, Chin and Limb EMG, were sampled at 200 Hz.  ECG, Snore and Nasal Pressure, Thermal Airflow, Respiratory Effort, CPAP Flow and Pressure, Oximetry was sampled at 50 Hz. Digital video and audio were recorded.      BASELINE STUDY  Lights Out was at 22:45 and Lights On at 05:02.  Total recording time (TRT) was 377.5 minutes, with a total sleep time (TST) of 212 minutes.   The patient's sleep latency was 118.5 minutes.  REM latency was 155.5 minutes.  The sleep efficiency was poor at 56.2 %.     SLEEP ARCHITECTURE: WASO (Wake after sleep onset) was 60 minutes.  There were 7.5 minutes in Stage N1, 160.5 minutes Stage N2, 20 minutes Stage N3 and 24 minutes in Stage REM.  The percentage of Stage N1 was 3.5%, Stage N2 was 75.7%, Stage N3 was 9.4% and Stage R (REM sleep) was 11.3%.    RESPIRATORY ANALYSIS:  There were a total  of 25 respiratory events:  1 obstructive apneas, 0 central apneas and 0 mixed apneas with a total of 1 apneas and an apnea index (AI) of .3 /hour. There were 24 hypopneas with a hypopnea index of 6.8 /hour. The patient also had 0 respiratory event related arousals (RERAs).      The total APNEA/HYPOPNEA INDEX (AHI) was 7.1/hour and the total RESPIRATORY DISTURBANCE INDEX was 7.1 /hour.  17 events occurred in REM sleep and 16 events in NREM. The REM AHI was 42.5 /hour, versus a non-REM AHI of 2.6. The patient spent 188 minutes of total sleep time in the supine position and 24 minutes in non-supine position. The supine AHI was 7.6 versus a non-supine AHI of 2.5.  OXYGEN SATURATION & C02:  The Wake baseline 02 saturation was 94%, with the lowest being 81%. Time spent below 89% saturation equaled 11 minutes.  CO2 Total sleep time greater than 40 torr was 0.70 minutes. Peak CO2 in REM and NREM and total time over 50 torr was not recorded.    PERIODIC LIMB MOVEMENTS:  The patient had a total of 0 Periodic Limb Movements.   The arousals were noted as: 97 were spontaneous, 0 were associated with PLMs, and 11 were associated with respiratory events. Audio and video analysis did not show any abnormal or unusual movements, behaviors, phonations or vocalizations.    Snoring was noted. EKG was in keeping with tachycardia in sinus rhythm (NSR). Post-study, the patient indicated that sleep was  better than usual.    IMPRESSION:  1. Mild Obstructive Sleep Apnea (OSA) with strong REM accentuation- No prolonged hypoxemia or hypercapnia was recorded.  2. Primary Snoring. 3. Low sleep efficiency.   RECOMMENDATIONS:  1. Advise full-night, attended, CPAP titration study to optimize therapy.  2. If CPAP cannot be tolerated ( see my clinic notes ) will recommend further weight loss and proceed to [facility] evaluation.     I certify that I have reviewed the entire raw data recording prior to the issuance of this  report in accordance with the Standards of Accreditation of the American Academy of Sleep Medicine (AASM)    Melvyn Novas, MD     07-09-2017  Diplomat, American Board of Psychiatry and Neurology  Diplomat, American Board of Sleep Medicine Medical Director, [facility] [employer]

## 2017-07-10 ENCOUNTER — Telehealth: Payer: Self-pay | Admitting: Neurology

## 2017-07-10 ENCOUNTER — Telehealth: Payer: Self-pay

## 2017-07-10 DIAGNOSIS — G4733 Obstructive sleep apnea (adult) (pediatric): Secondary | ICD-10-CM

## 2017-07-10 NOTE — Telephone Encounter (Signed)
[employer] denied cpap titration. She will need an Auto order sent to DME.

## 2017-07-10 NOTE — Telephone Encounter (Signed)
-----   Message from Melvyn Novas, MD sent at 07/09/2017  5:18 PM EDT ----- Joen Laura try a CPAP - gentle does it- if she cannot tolerate CPAP will refer for [facility] work up.

## 2017-07-10 NOTE — Telephone Encounter (Signed)
I called pt. I advised pt that Dr. Vickey Huger reviewed their sleep study results and found that has sleep and recommends that pt be treated with a cpap. Dr. Vickey Huger recommends that pt return for a repeat sleep study in order to properly titrate the cpap and ensure a good mask fit. Pt is agreeable to returning for a titration study. I advised pt that our sleep lab will file with pt's insurance and call pt to schedule the sleep study when we hear back from the pt's insurance regarding coverage of this sleep study. Pt verbalized understanding of results. Pt had no questions at this time but was encouraged to call back if questions arise.

## 2017-07-10 NOTE — Telephone Encounter (Signed)
Called patient to discuss sleep study results. No answer at this time. LVM for the patient to call back.   

## 2017-07-10 NOTE — Telephone Encounter (Signed)
-----   Message from Carmen Dohmeier, MD sent at 07/09/2017  5:18 PM EDT ----- Lets try a CPAP - gentle does it- if she cannot tolerate CPAP will refer for Inspire work up. 

## 2017-07-11 DIAGNOSIS — J3081 Allergic rhinitis due to animal (cat) (dog) hair and dander: Secondary | ICD-10-CM | POA: Diagnosis not present

## 2017-07-11 DIAGNOSIS — J3089 Other allergic rhinitis: Secondary | ICD-10-CM | POA: Diagnosis not present

## 2017-07-11 DIAGNOSIS — J301 Allergic rhinitis due to pollen: Secondary | ICD-10-CM | POA: Diagnosis not present

## 2017-07-11 NOTE — Telephone Encounter (Signed)
Auto CPAP 5-12 cm water.

## 2017-07-12 DIAGNOSIS — Z1231 Encounter for screening mammogram for malignant neoplasm of breast: Secondary | ICD-10-CM | POA: Diagnosis not present

## 2017-07-12 DIAGNOSIS — Z01419 Encounter for gynecological examination (general) (routine) without abnormal findings: Secondary | ICD-10-CM | POA: Diagnosis not present

## 2017-07-12 DIAGNOSIS — Z6831 Body mass index (BMI) 31.0-31.9, adult: Secondary | ICD-10-CM | POA: Diagnosis not present

## 2017-07-12 DIAGNOSIS — Z1212 Encounter for screening for malignant neoplasm of rectum: Secondary | ICD-10-CM | POA: Diagnosis not present

## 2017-07-12 NOTE — Telephone Encounter (Signed)
Called the pt today to offer a auto cpap since insurance denied. Dr. Vickey Huger recommends that pt starts a auto CPAP set 5-12 cm of water pressure. I reviewed PAP compliance expectations with the pt. Pt is agreeable to starting a CPAP. I advised pt that an order will be sent to a DME, [facility], and [facility] will call the pt within about one week after they file with the pt's insurance. [facility] will show the pt how to use the machine, fit for masks, and troubleshoot the CPAP if needed. A follow up appt was made for insurance purposes with Darrol Angel, NP on Oct 18, 2017 at 8:15 am. Pt verbalized understanding to arrive 15 minutes early and bring their CPAP. A letter with all of this information in it will be mailed to the pt as a reminder. I verified with the pt that the address we have on file is correct. Pt verbalized understanding of results. Pt had no questions at this time but was encouraged to call back if questions arise.

## 2017-07-18 DIAGNOSIS — F3189 Other bipolar disorder: Secondary | ICD-10-CM | POA: Diagnosis not present

## 2017-07-18 DIAGNOSIS — J301 Allergic rhinitis due to pollen: Secondary | ICD-10-CM | POA: Diagnosis not present

## 2017-07-18 DIAGNOSIS — J3089 Other allergic rhinitis: Secondary | ICD-10-CM | POA: Diagnosis not present

## 2017-07-18 DIAGNOSIS — J3081 Allergic rhinitis due to animal (cat) (dog) hair and dander: Secondary | ICD-10-CM | POA: Diagnosis not present

## 2017-07-19 DIAGNOSIS — J301 Allergic rhinitis due to pollen: Secondary | ICD-10-CM | POA: Diagnosis not present

## 2017-07-19 DIAGNOSIS — J3081 Allergic rhinitis due to animal (cat) (dog) hair and dander: Secondary | ICD-10-CM | POA: Diagnosis not present

## 2017-07-19 DIAGNOSIS — J019 Acute sinusitis, unspecified: Secondary | ICD-10-CM | POA: Diagnosis not present

## 2017-07-19 DIAGNOSIS — J4520 Mild intermittent asthma, uncomplicated: Secondary | ICD-10-CM | POA: Diagnosis not present

## 2017-07-31 DIAGNOSIS — G4733 Obstructive sleep apnea (adult) (pediatric): Secondary | ICD-10-CM | POA: Diagnosis not present

## 2017-08-01 DIAGNOSIS — J301 Allergic rhinitis due to pollen: Secondary | ICD-10-CM | POA: Diagnosis not present

## 2017-08-01 DIAGNOSIS — J3089 Other allergic rhinitis: Secondary | ICD-10-CM | POA: Diagnosis not present

## 2017-08-01 DIAGNOSIS — J3081 Allergic rhinitis due to animal (cat) (dog) hair and dander: Secondary | ICD-10-CM | POA: Diagnosis not present

## 2017-08-03 DIAGNOSIS — G43719 Chronic migraine without aura, intractable, without status migrainosus: Secondary | ICD-10-CM | POA: Diagnosis not present

## 2017-08-15 DIAGNOSIS — J3081 Allergic rhinitis due to animal (cat) (dog) hair and dander: Secondary | ICD-10-CM | POA: Diagnosis not present

## 2017-08-15 DIAGNOSIS — J301 Allergic rhinitis due to pollen: Secondary | ICD-10-CM | POA: Diagnosis not present

## 2017-08-15 DIAGNOSIS — J3089 Other allergic rhinitis: Secondary | ICD-10-CM | POA: Diagnosis not present

## 2017-08-28 DIAGNOSIS — M542 Cervicalgia: Secondary | ICD-10-CM | POA: Diagnosis not present

## 2017-08-28 DIAGNOSIS — M791 Myalgia, unspecified site: Secondary | ICD-10-CM | POA: Diagnosis not present

## 2017-08-28 DIAGNOSIS — G43719 Chronic migraine without aura, intractable, without status migrainosus: Secondary | ICD-10-CM | POA: Diagnosis not present

## 2017-08-28 DIAGNOSIS — G518 Other disorders of facial nerve: Secondary | ICD-10-CM | POA: Diagnosis not present

## 2017-08-29 DIAGNOSIS — J301 Allergic rhinitis due to pollen: Secondary | ICD-10-CM | POA: Diagnosis not present

## 2017-08-29 DIAGNOSIS — J3081 Allergic rhinitis due to animal (cat) (dog) hair and dander: Secondary | ICD-10-CM | POA: Diagnosis not present

## 2017-08-29 DIAGNOSIS — J3089 Other allergic rhinitis: Secondary | ICD-10-CM | POA: Diagnosis not present

## 2017-08-31 DIAGNOSIS — G4733 Obstructive sleep apnea (adult) (pediatric): Secondary | ICD-10-CM | POA: Diagnosis not present

## 2017-09-05 DIAGNOSIS — G43719 Chronic migraine without aura, intractable, without status migrainosus: Secondary | ICD-10-CM | POA: Diagnosis not present

## 2017-09-11 DIAGNOSIS — F909 Attention-deficit hyperactivity disorder, unspecified type: Secondary | ICD-10-CM | POA: Diagnosis not present

## 2017-09-11 DIAGNOSIS — F3130 Bipolar disorder, current episode depressed, mild or moderate severity, unspecified: Secondary | ICD-10-CM | POA: Diagnosis not present

## 2017-09-11 DIAGNOSIS — F4010 Social phobia, unspecified: Secondary | ICD-10-CM | POA: Diagnosis not present

## 2017-09-19 DIAGNOSIS — J3081 Allergic rhinitis due to animal (cat) (dog) hair and dander: Secondary | ICD-10-CM | POA: Diagnosis not present

## 2017-09-19 DIAGNOSIS — J301 Allergic rhinitis due to pollen: Secondary | ICD-10-CM | POA: Diagnosis not present

## 2017-09-19 DIAGNOSIS — J3089 Other allergic rhinitis: Secondary | ICD-10-CM | POA: Diagnosis not present

## 2017-09-20 DIAGNOSIS — F3189 Other bipolar disorder: Secondary | ICD-10-CM | POA: Diagnosis not present

## 2017-09-30 DIAGNOSIS — G4733 Obstructive sleep apnea (adult) (pediatric): Secondary | ICD-10-CM | POA: Diagnosis not present

## 2017-10-03 DIAGNOSIS — J301 Allergic rhinitis due to pollen: Secondary | ICD-10-CM | POA: Diagnosis not present

## 2017-10-03 DIAGNOSIS — J3081 Allergic rhinitis due to animal (cat) (dog) hair and dander: Secondary | ICD-10-CM | POA: Diagnosis not present

## 2017-10-03 DIAGNOSIS — J3089 Other allergic rhinitis: Secondary | ICD-10-CM | POA: Diagnosis not present

## 2017-10-10 DIAGNOSIS — G43719 Chronic migraine without aura, intractable, without status migrainosus: Secondary | ICD-10-CM | POA: Diagnosis not present

## 2017-10-17 DIAGNOSIS — J301 Allergic rhinitis due to pollen: Secondary | ICD-10-CM | POA: Diagnosis not present

## 2017-10-17 DIAGNOSIS — J3089 Other allergic rhinitis: Secondary | ICD-10-CM | POA: Diagnosis not present

## 2017-10-17 DIAGNOSIS — J3081 Allergic rhinitis due to animal (cat) (dog) hair and dander: Secondary | ICD-10-CM | POA: Diagnosis not present

## 2017-10-18 ENCOUNTER — Ambulatory Visit: Payer: Self-pay | Admitting: Nurse Practitioner

## 2017-10-26 DIAGNOSIS — G4733 Obstructive sleep apnea (adult) (pediatric): Secondary | ICD-10-CM | POA: Diagnosis not present

## 2017-10-30 ENCOUNTER — Encounter: Payer: Self-pay | Admitting: Adult Health

## 2017-10-30 ENCOUNTER — Ambulatory Visit: Payer: BLUE CROSS/BLUE SHIELD | Admitting: Adult Health

## 2017-10-30 ENCOUNTER — Encounter

## 2017-10-30 VITALS — BP 132/85 | HR 104 | Ht 61.0 in | Wt 176.6 lb

## 2017-10-30 DIAGNOSIS — G4733 Obstructive sleep apnea (adult) (pediatric): Secondary | ICD-10-CM | POA: Diagnosis not present

## 2017-10-30 DIAGNOSIS — Z9989 Dependence on other enabling machines and devices: Secondary | ICD-10-CM

## 2017-10-30 NOTE — Patient Instructions (Signed)
Your Plan:  Continue using CPAP nightly and >4 hours each night If your symptoms worsen or you develop new symptoms please let us know.   Thank you for coming to see us at [facility]. I hope we have been able to provide you high quality care today.  You may receive a patient satisfaction survey over the next few weeks. We would appreciate your feedback and comments so that we may continue to improve ourselves and the health of our patients.  

## 2017-10-30 NOTE — Progress Notes (Signed)
PATIENT: [name] Park DOB: 11-Dec-1975  REASON FOR VISIT: follow up HISTORY FROM: patient  HISTORY OF PRESENT ILLNESS: Today 10/30/17:  [name] is a 42 year old female with a history of obstructive sleep apnea on CPAP.  Her CPAP download today indicates that she use her machine 30 out of 30 days for compliance of 100%.  Use her machine greater than 4 hours each night.  On average she uses her machine 6 hours and 23 minutes.  Her residual AHI is 0.6 on 5-12 cmH2O with EPR of 3.  She does not have a significant leak.  She states that she has been trying to get acclimated to the machine therefore she is really not noticed much.  Her Epworth sleepiness score is 11.  And fatigue severity score is 36.  She returns today for evaluation.  HISTORY [name] Park is a 42 y.o. female , seen here as in a referral  from NP O'Neal and here for a sleep evaluation.   The patient carries the diagnosis of bipolar disorder type 2 , ADD, OCD, obesity, frequent sinusitis, and nasal congestion and visibly very small lower jaw. Hypoplastic sinus structures, and longstanding sleep problems.  Chief complaint according to patient :Trouble falling asleep,her thoughts would be racing, and she would be anxious and unable to relax.  Seroquel and melatonin are used for sleep induction.  She wakes up frequently at night, and in the morning is not well rested.   REVIEW OF SYSTEMS: Out of a complete 14 system review of symptoms, the patient complains only of the following symptoms, and all other reviewed systems are negative.  See HPI ALLERGIES: Allergies  Allergen Reactions  . Emgality [Galcanezumab-Gnlm]     Not effective.  10-30-17  . Prednisone     Mood alterations  . Latex Rash    HOME MEDICATIONS: Outpatient Medications Prior to Visit  Medication Sig Dispense Refill  . albuterol (PROVENTIL HFA;VENTOLIN HFA) 108 (90 BASE) MCG/ACT inhaler Inhale 2 puffs into the lungs every 6 (six) hours as  needed for wheezing.    . Ascorbic Acid (VITAMIN C) 1000 MG tablet Take 1,000 mg by mouth daily.    . baclofen (LIORESAL) 10 MG tablet Take 10 mg by mouth daily as needed (for migraines). Only allowed Monday through Friday as needed    . cetirizine (ZYRTEC) 10 MG tablet Take 10 mg by mouth daily.    . chlorproMAZINE (THORAZINE) 25 MG tablet Take 25 mg by mouth daily as needed.     . fluticasone (VERAMYST) 27.5 MCG/SPRAY nasal spray Place 2 sprays into the nose daily as needed for allergies.    Marland Kitchen ibuprofen (ADVIL,MOTRIN) 200 MG tablet Take 400 mg by mouth every 6 (six) hours as needed (for back pain).    . Melatonin 3 MG TABS Take 9 mg by mouth at bedtime.    . metFORMIN (GLUCOPHAGE) 500 MG tablet Take 500 mg by mouth 2 (two) times daily with a meal.     . montelukast (SINGULAIR) 10 MG tablet Take 10 mg by mouth every evening.    Marland Kitchen PRESCRIPTION MEDICATION Apply 1 application topically. For facial rosacea    . QUEtiapine (SEROQUEL) 400 MG tablet Take 100 mg by mouth at bedtime. Take 2 at bedtime    . ranitidine (ZANTAC) 150 MG tablet   4  . spironolactone (ALDACTONE) 100 MG tablet Take 50 mg by mouth daily.    Marland Kitchen SYNTHROID 50 MCG tablet   0  . EMGALITY 120  MG/ML SOAJ   0   No facility-administered medications prior to visit.     PAST MEDICAL HISTORY: Past Medical History:  Diagnosis Date  . ADHD (attention deficit hyperactivity disorder)   . Anxiety   . Bipolar 1 disorder (HCC)   . Carpal tunnel syndrome, bilateral   . Environmental allergies   . Migraine   . OCD (obsessive compulsive disorder)   . Polycystic ovarian disease     PAST SURGICAL HISTORY: Past Surgical History:  Procedure Laterality Date  . EYE SURGERY    . TONSILECTOMY, ADENOIDECTOMY, BILATERAL MYRINGOTOMY AND TUBES    . TONSILLECTOMY      FAMILY HISTORY: No family history on file.  SOCIAL HISTORY: Social History   Socioeconomic History  . Marital status: Single    Spouse name: Not on file  . Number of  children: Not on file  . Years of education: Not on file  . Highest education level: Not on file  Occupational History  . Not on file  Social Needs  . Financial resource strain: Not on file  . Food insecurity:    Worry: Not on file    Inability: Not on file  . Transportation needs:    Medical: Not on file    Non-medical: Not on file  Tobacco Use  . Smoking status: Never Smoker  . Smokeless tobacco: Never Used  Substance and Sexual Activity  . Alcohol use: Yes    Alcohol/week: 7.0 standard drinks    Types: 7 Glasses of wine per week    Comment: up to 12 oz daily of wine or beer.  . Drug use: No  . Sexual activity: Not Currently    Birth control/protection: Pill  Lifestyle  . Physical activity:    Days per week: Not on file    Minutes per session: Not on file  . Stress: Not on file  Relationships  . Social connections:    Talks on phone: Not on file    Gets together: Not on file    Attends religious service: Not on file    Active member of club or organization: Not on file    Attends meetings of clubs or organizations: Not on file    Relationship status: Not on file  . Intimate partner violence:    Fear of current or ex partner: Not on file    Emotionally abused: Not on file    Physically abused: Not on file    Forced sexual activity: Not on file  Other Topics Concern  . Not on file  Social History Narrative  . Not on file      PHYSICAL EXAM  Vitals:   10/30/17 1440  BP: 132/85  Pulse: (!) 104  Weight: 176 lb 9.6 oz (80.1 kg)  Height: 5\' 1"  (1.549 m)   Body mass index is 33.37 kg/m.  Generalized: Well developed, in no acute distress   Neurological examination  Mentation: Alert oriented to time, place, history taking. Follows all commands speech and language fluent Cranial nerve II-XII: Pupils were equal round reactive to light. Extraocular movements were full, visual field were full on confrontational test. Facial sensation and strength were normal.  Uvula tongue midline. Head turning and shoulder shrug  were normal and symmetric.  Neck circumference 18 inches,  Motor: The motor testing reveals 5 over 5 strength of all 4 extremities. Good symmetric motor tone is noted throughout.  Sensory: Sensory testing is intact to soft touch on all 4 extremities. No evidence of extinction  is noted.  Coordination: Cerebellar testing reveals good finger-nose-finger and heel-to-shin bilaterally.  Gait and station: Gait is normal.  Reflexes: Deep tendon reflexes are symmetric and normal bilaterally.   DIAGNOSTIC DATA (LABS, IMAGING, TESTING) - I reviewed patient records, labs, notes, testing and imaging myself where available.     ASSESSMENT AND PLAN 42 y.o. year old female  has a past medical history of ADHD (attention deficit hyperactivity disorder), Anxiety, Bipolar 1 disorder (HCC), Carpal tunnel syndrome, bilateral, Environmental allergies, Migraine, OCD (obsessive compulsive disorder), and Polycystic ovarian disease. here with :  1.  Obstructive sleep apnea on CPAP  The patient CPAP shows excellent compliance and good treatment of her apnea.  She is encouraged to continue using the CPAP nightly and  4 hours each night.  She is advised that if her symptoms worsen or she develops new symptoms he should let us know.  She will follow-up in 6 months or sooner if needed.   I spent 15 minutes with the patient. 50% of this time was spent reviewing her CPAP download  Butch PennyMegan Noriel Guthrie, MSN, NP-C 10/30/2017, 2:51 PM [facility] [address], [address] [place], [place] [address] [phone]

## 2017-10-31 DIAGNOSIS — J301 Allergic rhinitis due to pollen: Secondary | ICD-10-CM | POA: Diagnosis not present

## 2017-10-31 DIAGNOSIS — J3081 Allergic rhinitis due to animal (cat) (dog) hair and dander: Secondary | ICD-10-CM | POA: Diagnosis not present

## 2017-10-31 DIAGNOSIS — J3089 Other allergic rhinitis: Secondary | ICD-10-CM | POA: Diagnosis not present

## 2017-11-06 DIAGNOSIS — F3130 Bipolar disorder, current episode depressed, mild or moderate severity, unspecified: Secondary | ICD-10-CM | POA: Diagnosis not present

## 2017-11-06 DIAGNOSIS — F4010 Social phobia, unspecified: Secondary | ICD-10-CM | POA: Diagnosis not present

## 2017-11-06 DIAGNOSIS — F909 Attention-deficit hyperactivity disorder, unspecified type: Secondary | ICD-10-CM | POA: Diagnosis not present

## 2017-11-07 NOTE — Progress Notes (Signed)
I agree with the assessment and plan as directed by NP .The patient is known to me .   Cotey Rakes, MD  

## 2017-11-14 DIAGNOSIS — J3081 Allergic rhinitis due to animal (cat) (dog) hair and dander: Secondary | ICD-10-CM | POA: Diagnosis not present

## 2017-11-14 DIAGNOSIS — G43719 Chronic migraine without aura, intractable, without status migrainosus: Secondary | ICD-10-CM | POA: Diagnosis not present

## 2017-11-14 DIAGNOSIS — J301 Allergic rhinitis due to pollen: Secondary | ICD-10-CM | POA: Diagnosis not present

## 2017-11-14 DIAGNOSIS — J3089 Other allergic rhinitis: Secondary | ICD-10-CM | POA: Diagnosis not present

## 2017-11-28 DIAGNOSIS — M791 Myalgia, unspecified site: Secondary | ICD-10-CM | POA: Diagnosis not present

## 2017-11-28 DIAGNOSIS — J301 Allergic rhinitis due to pollen: Secondary | ICD-10-CM | POA: Diagnosis not present

## 2017-11-28 DIAGNOSIS — G518 Other disorders of facial nerve: Secondary | ICD-10-CM | POA: Diagnosis not present

## 2017-11-28 DIAGNOSIS — M542 Cervicalgia: Secondary | ICD-10-CM | POA: Diagnosis not present

## 2017-11-28 DIAGNOSIS — J3081 Allergic rhinitis due to animal (cat) (dog) hair and dander: Secondary | ICD-10-CM | POA: Diagnosis not present

## 2017-11-28 DIAGNOSIS — J3089 Other allergic rhinitis: Secondary | ICD-10-CM | POA: Diagnosis not present

## 2017-11-28 DIAGNOSIS — G43719 Chronic migraine without aura, intractable, without status migrainosus: Secondary | ICD-10-CM | POA: Diagnosis not present

## 2017-11-29 DIAGNOSIS — J301 Allergic rhinitis due to pollen: Secondary | ICD-10-CM | POA: Diagnosis not present

## 2017-11-29 DIAGNOSIS — J3081 Allergic rhinitis due to animal (cat) (dog) hair and dander: Secondary | ICD-10-CM | POA: Diagnosis not present

## 2017-11-30 DIAGNOSIS — J3089 Other allergic rhinitis: Secondary | ICD-10-CM | POA: Diagnosis not present

## 2017-12-12 DIAGNOSIS — G43719 Chronic migraine without aura, intractable, without status migrainosus: Secondary | ICD-10-CM | POA: Diagnosis not present

## 2017-12-12 DIAGNOSIS — J3089 Other allergic rhinitis: Secondary | ICD-10-CM | POA: Diagnosis not present

## 2017-12-12 DIAGNOSIS — J3081 Allergic rhinitis due to animal (cat) (dog) hair and dander: Secondary | ICD-10-CM | POA: Diagnosis not present

## 2017-12-12 DIAGNOSIS — J301 Allergic rhinitis due to pollen: Secondary | ICD-10-CM | POA: Diagnosis not present

## 2017-12-26 DIAGNOSIS — J019 Acute sinusitis, unspecified: Secondary | ICD-10-CM | POA: Diagnosis not present

## 2017-12-26 DIAGNOSIS — Z23 Encounter for immunization: Secondary | ICD-10-CM | POA: Diagnosis not present

## 2018-01-04 DIAGNOSIS — F3130 Bipolar disorder, current episode depressed, mild or moderate severity, unspecified: Secondary | ICD-10-CM | POA: Diagnosis not present

## 2018-01-04 DIAGNOSIS — F4010 Social phobia, unspecified: Secondary | ICD-10-CM | POA: Diagnosis not present

## 2018-01-04 DIAGNOSIS — G4733 Obstructive sleep apnea (adult) (pediatric): Secondary | ICD-10-CM | POA: Diagnosis not present

## 2018-01-04 DIAGNOSIS — F909 Attention-deficit hyperactivity disorder, unspecified type: Secondary | ICD-10-CM | POA: Diagnosis not present

## 2018-01-14 DIAGNOSIS — G43719 Chronic migraine without aura, intractable, without status migrainosus: Secondary | ICD-10-CM | POA: Diagnosis not present

## 2018-01-16 DIAGNOSIS — J3089 Other allergic rhinitis: Secondary | ICD-10-CM | POA: Diagnosis not present

## 2018-01-16 DIAGNOSIS — J301 Allergic rhinitis due to pollen: Secondary | ICD-10-CM | POA: Diagnosis not present

## 2018-01-16 DIAGNOSIS — J3081 Allergic rhinitis due to animal (cat) (dog) hair and dander: Secondary | ICD-10-CM | POA: Diagnosis not present

## 2018-01-21 DIAGNOSIS — N390 Urinary tract infection, site not specified: Secondary | ICD-10-CM | POA: Diagnosis not present

## 2018-01-22 DIAGNOSIS — E039 Hypothyroidism, unspecified: Secondary | ICD-10-CM | POA: Diagnosis not present

## 2018-01-23 DIAGNOSIS — J301 Allergic rhinitis due to pollen: Secondary | ICD-10-CM | POA: Diagnosis not present

## 2018-01-23 DIAGNOSIS — J3081 Allergic rhinitis due to animal (cat) (dog) hair and dander: Secondary | ICD-10-CM | POA: Diagnosis not present

## 2018-01-23 DIAGNOSIS — J3089 Other allergic rhinitis: Secondary | ICD-10-CM | POA: Diagnosis not present

## 2018-02-03 DIAGNOSIS — G4733 Obstructive sleep apnea (adult) (pediatric): Secondary | ICD-10-CM | POA: Diagnosis not present

## 2018-02-05 DIAGNOSIS — R300 Dysuria: Secondary | ICD-10-CM | POA: Diagnosis not present

## 2018-02-12 DIAGNOSIS — J3081 Allergic rhinitis due to animal (cat) (dog) hair and dander: Secondary | ICD-10-CM | POA: Diagnosis not present

## 2018-02-12 DIAGNOSIS — J301 Allergic rhinitis due to pollen: Secondary | ICD-10-CM | POA: Diagnosis not present

## 2018-02-12 DIAGNOSIS — J3089 Other allergic rhinitis: Secondary | ICD-10-CM | POA: Diagnosis not present

## 2018-02-14 DIAGNOSIS — G43719 Chronic migraine without aura, intractable, without status migrainosus: Secondary | ICD-10-CM | POA: Diagnosis not present

## 2018-02-18 DIAGNOSIS — J3089 Other allergic rhinitis: Secondary | ICD-10-CM | POA: Diagnosis not present

## 2018-02-18 DIAGNOSIS — J4520 Mild intermittent asthma, uncomplicated: Secondary | ICD-10-CM | POA: Diagnosis not present

## 2018-02-18 DIAGNOSIS — J301 Allergic rhinitis due to pollen: Secondary | ICD-10-CM | POA: Diagnosis not present

## 2018-02-18 DIAGNOSIS — J3081 Allergic rhinitis due to animal (cat) (dog) hair and dander: Secondary | ICD-10-CM | POA: Diagnosis not present

## 2018-02-23 ENCOUNTER — Ambulatory Visit: Payer: BLUE CROSS/BLUE SHIELD | Admitting: Emergency Medicine

## 2018-02-28 DIAGNOSIS — J301 Allergic rhinitis due to pollen: Secondary | ICD-10-CM | POA: Diagnosis not present

## 2018-02-28 DIAGNOSIS — J3081 Allergic rhinitis due to animal (cat) (dog) hair and dander: Secondary | ICD-10-CM | POA: Diagnosis not present

## 2018-02-28 DIAGNOSIS — J3089 Other allergic rhinitis: Secondary | ICD-10-CM | POA: Diagnosis not present

## 2018-03-05 DIAGNOSIS — J3081 Allergic rhinitis due to animal (cat) (dog) hair and dander: Secondary | ICD-10-CM | POA: Diagnosis not present

## 2018-03-05 DIAGNOSIS — G518 Other disorders of facial nerve: Secondary | ICD-10-CM | POA: Diagnosis not present

## 2018-03-05 DIAGNOSIS — M791 Myalgia, unspecified site: Secondary | ICD-10-CM | POA: Diagnosis not present

## 2018-03-05 DIAGNOSIS — J301 Allergic rhinitis due to pollen: Secondary | ICD-10-CM | POA: Diagnosis not present

## 2018-03-05 DIAGNOSIS — J3089 Other allergic rhinitis: Secondary | ICD-10-CM | POA: Diagnosis not present

## 2018-03-05 DIAGNOSIS — M542 Cervicalgia: Secondary | ICD-10-CM | POA: Diagnosis not present

## 2018-03-05 DIAGNOSIS — G43719 Chronic migraine without aura, intractable, without status migrainosus: Secondary | ICD-10-CM | POA: Diagnosis not present

## 2018-03-06 DIAGNOSIS — G4733 Obstructive sleep apnea (adult) (pediatric): Secondary | ICD-10-CM | POA: Diagnosis not present

## 2018-03-07 DIAGNOSIS — J3089 Other allergic rhinitis: Secondary | ICD-10-CM | POA: Diagnosis not present

## 2018-03-07 DIAGNOSIS — J3081 Allergic rhinitis due to animal (cat) (dog) hair and dander: Secondary | ICD-10-CM | POA: Diagnosis not present

## 2018-03-07 DIAGNOSIS — J301 Allergic rhinitis due to pollen: Secondary | ICD-10-CM | POA: Diagnosis not present

## 2018-03-14 DIAGNOSIS — G43719 Chronic migraine without aura, intractable, without status migrainosus: Secondary | ICD-10-CM | POA: Diagnosis not present

## 2018-03-15 DIAGNOSIS — F4010 Social phobia, unspecified: Secondary | ICD-10-CM | POA: Diagnosis not present

## 2018-03-15 DIAGNOSIS — F909 Attention-deficit hyperactivity disorder, unspecified type: Secondary | ICD-10-CM | POA: Diagnosis not present

## 2018-03-15 DIAGNOSIS — F3130 Bipolar disorder, current episode depressed, mild or moderate severity, unspecified: Secondary | ICD-10-CM | POA: Diagnosis not present

## 2018-03-20 DIAGNOSIS — J3089 Other allergic rhinitis: Secondary | ICD-10-CM | POA: Diagnosis not present

## 2018-03-20 DIAGNOSIS — J3081 Allergic rhinitis due to animal (cat) (dog) hair and dander: Secondary | ICD-10-CM | POA: Diagnosis not present

## 2018-03-20 DIAGNOSIS — J301 Allergic rhinitis due to pollen: Secondary | ICD-10-CM | POA: Diagnosis not present

## 2018-04-04 DIAGNOSIS — J3089 Other allergic rhinitis: Secondary | ICD-10-CM | POA: Diagnosis not present

## 2018-04-04 DIAGNOSIS — J301 Allergic rhinitis due to pollen: Secondary | ICD-10-CM | POA: Diagnosis not present

## 2018-04-04 DIAGNOSIS — J3081 Allergic rhinitis due to animal (cat) (dog) hair and dander: Secondary | ICD-10-CM | POA: Diagnosis not present

## 2018-04-06 DIAGNOSIS — G4733 Obstructive sleep apnea (adult) (pediatric): Secondary | ICD-10-CM | POA: Diagnosis not present

## 2018-04-16 DIAGNOSIS — G43719 Chronic migraine without aura, intractable, without status migrainosus: Secondary | ICD-10-CM | POA: Diagnosis not present

## 2018-04-18 DIAGNOSIS — J301 Allergic rhinitis due to pollen: Secondary | ICD-10-CM | POA: Diagnosis not present

## 2018-04-18 DIAGNOSIS — J3089 Other allergic rhinitis: Secondary | ICD-10-CM | POA: Diagnosis not present

## 2018-04-18 DIAGNOSIS — J3081 Allergic rhinitis due to animal (cat) (dog) hair and dander: Secondary | ICD-10-CM | POA: Diagnosis not present

## 2018-04-30 IMAGING — CR DG CHEST 2V
2 series · 2 of 2 positions shown · non-contrast
Comparison: 11/18/2015.

CLINICAL DATA: Bronchopneumonia.

EXAM:
CHEST  2 VIEW

[w chest pa]
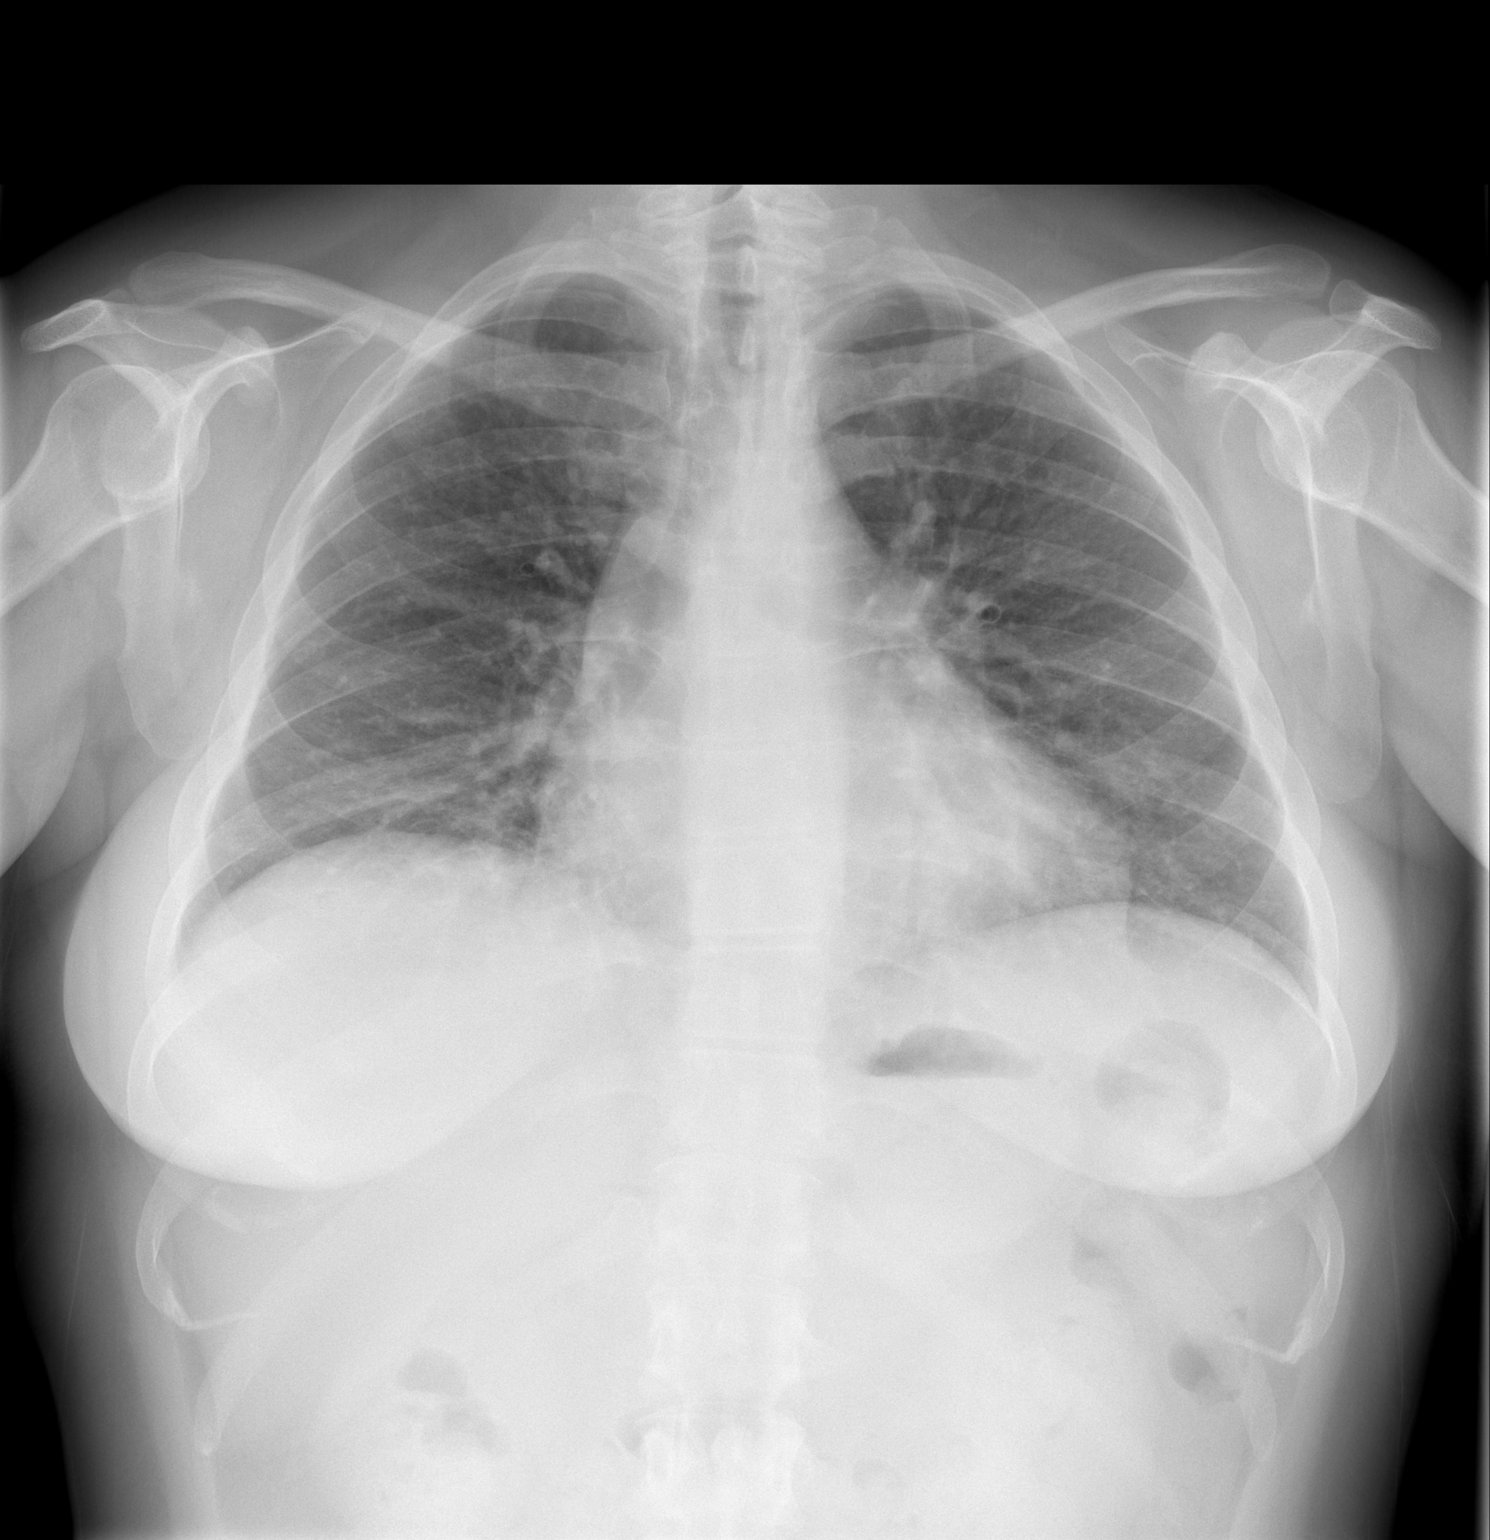

[w chest lat]
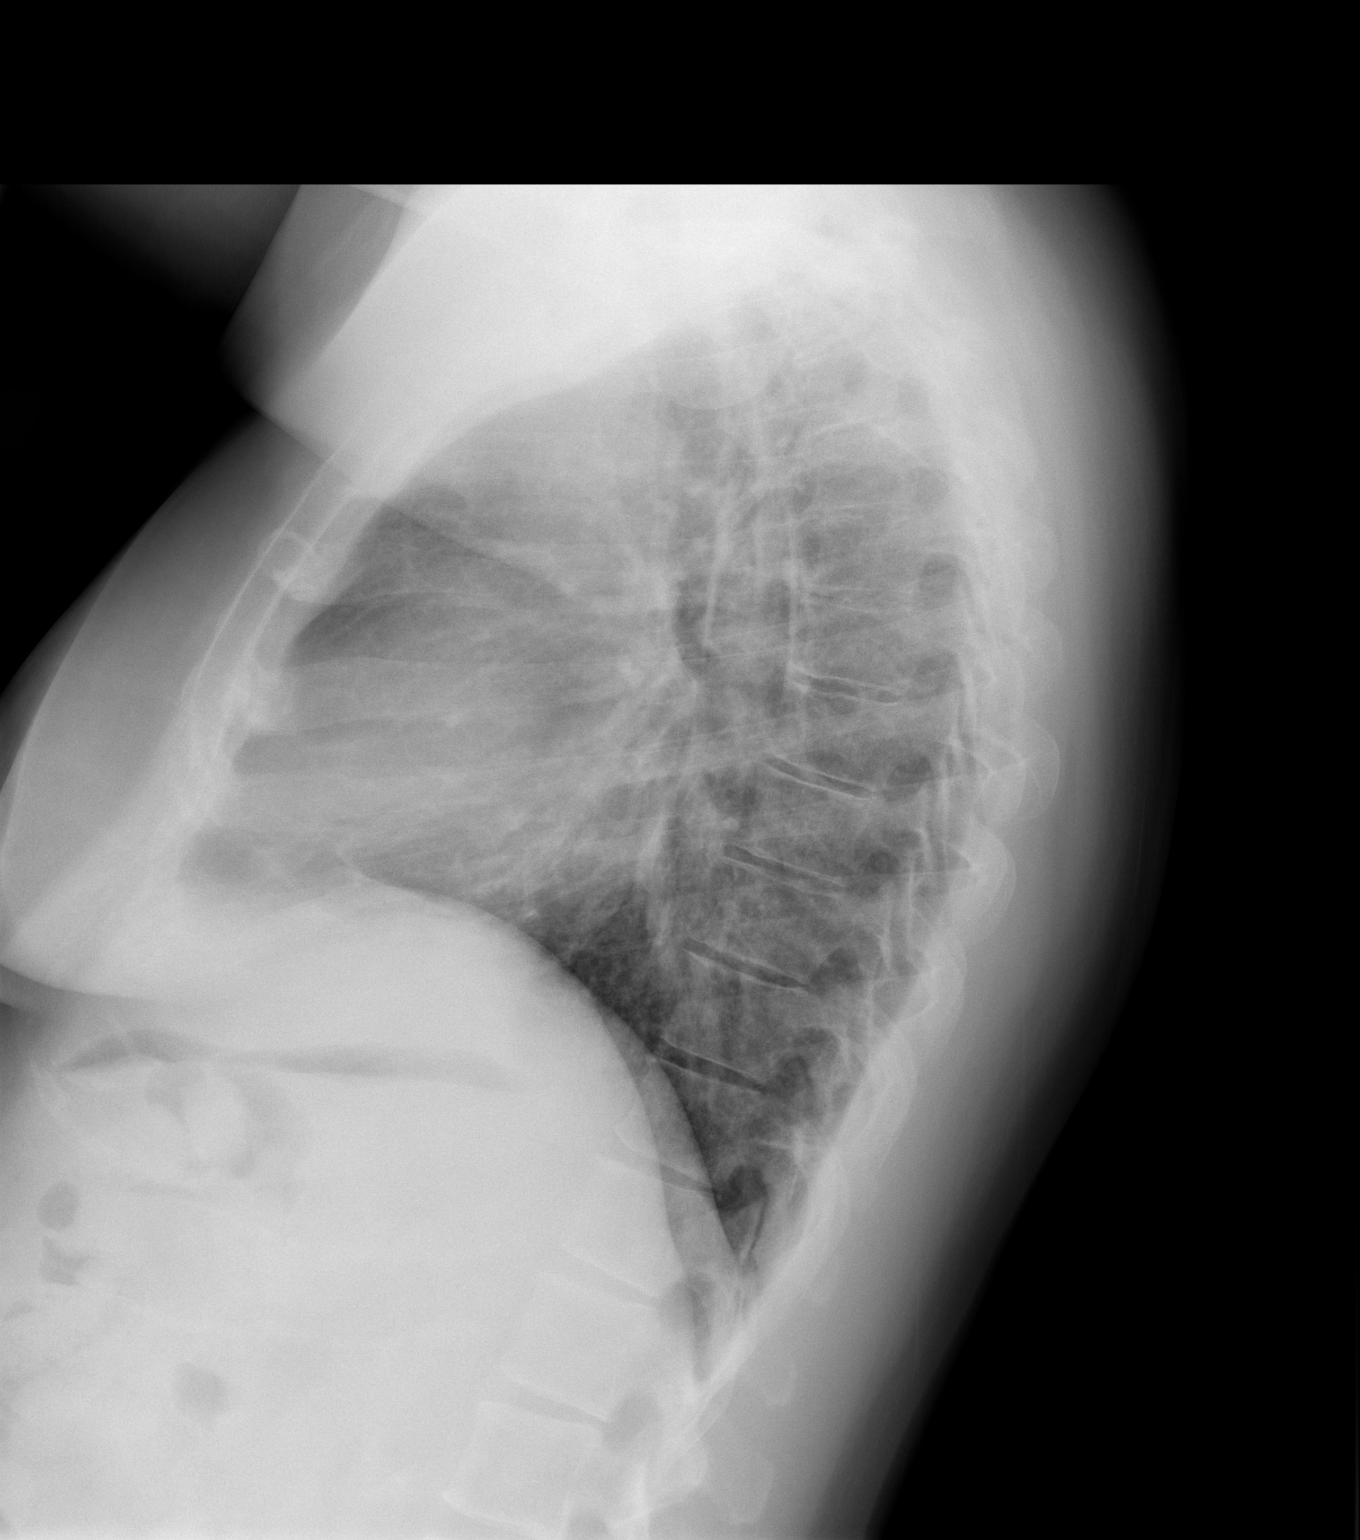

[2 of 2 positions shown; findings below may reference images not displayed]

FINDINGS: Mediastinum and hilar structures normal. Heart size normal. Partial
clearing of right base infiltrate. Low lung volumes. No pleural
effusion or pneumothorax.
IMPRESSION: Partial clearing of right base infiltrate. Low lung volumes with
basilar atelectasis.

## 2018-05-01 DIAGNOSIS — J019 Acute sinusitis, unspecified: Secondary | ICD-10-CM | POA: Diagnosis not present

## 2018-05-05 DIAGNOSIS — G4733 Obstructive sleep apnea (adult) (pediatric): Secondary | ICD-10-CM | POA: Diagnosis not present

## 2018-05-14 DIAGNOSIS — G43719 Chronic migraine without aura, intractable, without status migrainosus: Secondary | ICD-10-CM | POA: Diagnosis not present

## 2018-05-16 DIAGNOSIS — J301 Allergic rhinitis due to pollen: Secondary | ICD-10-CM | POA: Diagnosis not present

## 2018-05-16 DIAGNOSIS — J3089 Other allergic rhinitis: Secondary | ICD-10-CM | POA: Diagnosis not present

## 2018-05-16 DIAGNOSIS — J3081 Allergic rhinitis due to animal (cat) (dog) hair and dander: Secondary | ICD-10-CM | POA: Diagnosis not present

## 2018-05-19 ENCOUNTER — Telehealth: Payer: Self-pay | Admitting: *Deleted

## 2018-05-19 NOTE — Telephone Encounter (Signed)
Left patient message on voicemail that our office will be closed, on 05/20/2018, to disinfect.  Informed that we will call back to reschedule the appointment.  

## 2018-05-20 ENCOUNTER — Ambulatory Visit: Payer: BLUE CROSS/BLUE SHIELD | Admitting: Neurology

## 2018-05-20 NOTE — Telephone Encounter (Signed)
Called the patient to discuss getting her rescheduled for today's visit. No answer. LVM informing the patient to call back.  * When patient calls back, if I am available I would like to talk with her because she has been non compliant with her CPAP and I would like to address if she has any concerns. Patient should be encouraged to use her CPAP for at least 4 hrs each night and reschedule apt 31-90 days after using the machine and becoming more compliant.

## 2018-05-23 DIAGNOSIS — J3089 Other allergic rhinitis: Secondary | ICD-10-CM | POA: Diagnosis not present

## 2018-05-23 DIAGNOSIS — J301 Allergic rhinitis due to pollen: Secondary | ICD-10-CM | POA: Diagnosis not present

## 2018-05-23 DIAGNOSIS — J3081 Allergic rhinitis due to animal (cat) (dog) hair and dander: Secondary | ICD-10-CM | POA: Diagnosis not present

## 2018-06-04 ENCOUNTER — Telehealth: Payer: Self-pay | Admitting: Neurology

## 2018-06-04 DIAGNOSIS — M791 Myalgia, unspecified site: Secondary | ICD-10-CM | POA: Diagnosis not present

## 2018-06-04 DIAGNOSIS — G518 Other disorders of facial nerve: Secondary | ICD-10-CM | POA: Diagnosis not present

## 2018-06-04 DIAGNOSIS — G43719 Chronic migraine without aura, intractable, without status migrainosus: Secondary | ICD-10-CM | POA: Diagnosis not present

## 2018-06-04 DIAGNOSIS — M542 Cervicalgia: Secondary | ICD-10-CM | POA: Diagnosis not present

## 2018-06-04 NOTE — Telephone Encounter (Signed)
LVM to schedule virtual visit for 4/6 appointment, requested patient call us back. °

## 2018-06-05 DIAGNOSIS — G4733 Obstructive sleep apnea (adult) (pediatric): Secondary | ICD-10-CM | POA: Diagnosis not present

## 2018-06-05 NOTE — Telephone Encounter (Signed)
Called the patient to make sure her chart was updated and everything was ok for her apt on Monday 4/6. LVM for the patient to call back.

## 2018-06-06 ENCOUNTER — Encounter: Payer: Self-pay | Admitting: Neurology

## 2018-06-06 DIAGNOSIS — J3089 Other allergic rhinitis: Secondary | ICD-10-CM | POA: Diagnosis not present

## 2018-06-06 DIAGNOSIS — J301 Allergic rhinitis due to pollen: Secondary | ICD-10-CM | POA: Diagnosis not present

## 2018-06-06 DIAGNOSIS — J3081 Allergic rhinitis due to animal (cat) (dog) hair and dander: Secondary | ICD-10-CM | POA: Diagnosis not present

## 2018-06-06 NOTE — Addendum Note (Signed)
Addended by: Judi Cong on: 06/06/2018 01:24 PM   Modules accepted: Orders

## 2018-06-06 NOTE — Telephone Encounter (Signed)
Called the patient again to review with her about the video visit. Reviewed the patient's chart with her to make sure it was updated. Pt verbalized receiving the email and states she will download the app today. She will call with any questions.

## 2018-06-10 ENCOUNTER — Ambulatory Visit (INDEPENDENT_AMBULATORY_CARE_PROVIDER_SITE_OTHER): Payer: BLUE CROSS/BLUE SHIELD | Admitting: Neurology

## 2018-06-10 ENCOUNTER — Other Ambulatory Visit: Payer: Self-pay

## 2018-06-10 ENCOUNTER — Telehealth: Payer: Self-pay | Admitting: Neurology

## 2018-06-10 DIAGNOSIS — G4733 Obstructive sleep apnea (adult) (pediatric): Secondary | ICD-10-CM

## 2018-06-10 DIAGNOSIS — Z9989 Dependence on other enabling machines and devices: Secondary | ICD-10-CM

## 2018-06-10 DIAGNOSIS — G4719 Other hypersomnia: Secondary | ICD-10-CM

## 2018-06-10 DIAGNOSIS — F5105 Insomnia due to other mental disorder: Secondary | ICD-10-CM

## 2018-06-10 DIAGNOSIS — E669 Obesity, unspecified: Secondary | ICD-10-CM

## 2018-06-10 DIAGNOSIS — E66811 Obesity, class 1: Secondary | ICD-10-CM

## 2018-06-10 DIAGNOSIS — J4521 Mild intermittent asthma with (acute) exacerbation: Secondary | ICD-10-CM

## 2018-06-10 DIAGNOSIS — F99 Mental disorder, not otherwise specified: Secondary | ICD-10-CM

## 2018-06-10 NOTE — Patient Instructions (Signed)
I reviewed the patient's CPAP compliance data - The residual AHI was 0.1 per hour, but only 53 % compliance was noted- according to the patient affected by sinusitis, rhinitis.  I ordered  longer tubing for her.  I will review this data with the patient at the virtual visit,  Porfirio Mylar Katrena Stehlin [facility] ([facility]

## 2018-06-10 NOTE — Telephone Encounter (Signed)
LVM to schedule 6 month follow-up with NP per Dr. Dohmeier. °

## 2018-06-10 NOTE — Progress Notes (Signed)
Virtual Visit via Telephone Note  I connected with COSANDRA KHOSHABA on 06/10/18 at  8:30 AM EDT by telephone and verified that I am speaking with the correct person using two identifiers.   I discussed the limitations, risks, security and privacy concerns of performing an evaluation and management service by telephone and the availability of in person appointments. I also discussed with the patient that there may be a patient responsible charge related to this service. The patient expressed understanding and agreed to proceed.   History of Present Illness:  OSA on CPAP, compliance has been challenging.  Sinusitis, allergic rhinitis, asthma. Obesity at [facility] 32.  Epworth endorsed at 9 Points. How likely are you to doze in the following situations: 0 = not likely, 1 = slight chance, 2 = moderate chance, 3 = high chance  Sitting and Reading? Watching Television? Sitting inactive in a public place (theater or meeting)? Lying down in the afternoon when circumstances permit? Sitting and talking to someone? Sitting quietly after lunch without alcohol? In a car, while stopped for a few minutes in traffic? As a passenger in a car for an hour without a break?  Total = 9  Depression PHQ 9 and FSS were deferred.    Observations/Objective: we explored ways to increase compliance, one will be a longer hose to allow more movement while on CPAP in bed, another is follow up with allergy treatments.     Assessment and Plan: no changes in CPAP settings were needed. Compliance was 57 % for days, and 53% for hours of use, average on days used is 6 hours 14 minutes.    Follow Up Instructions: face to face in 6 month.     I discussed the assessment and treatment plan with the patient. The patient was provided an opportunity to ask questions and all were answered. The patient agreed with the plan and demonstrated an understanding of the instructions.   The patient was advised to call back or seek an  in-person evaluation if the symptoms worsen or if the condition fails to improve as anticipated.  I provided 18 minutes of non-face-to-face time during this encounter.  Rv  in 6 month face to face   Melvyn Novas, MD

## 2018-06-11 DIAGNOSIS — G43719 Chronic migraine without aura, intractable, without status migrainosus: Secondary | ICD-10-CM | POA: Diagnosis not present

## 2018-06-20 DIAGNOSIS — J3081 Allergic rhinitis due to animal (cat) (dog) hair and dander: Secondary | ICD-10-CM | POA: Diagnosis not present

## 2018-06-20 DIAGNOSIS — J301 Allergic rhinitis due to pollen: Secondary | ICD-10-CM | POA: Diagnosis not present

## 2018-06-20 DIAGNOSIS — J3089 Other allergic rhinitis: Secondary | ICD-10-CM | POA: Diagnosis not present

## 2018-07-01 ENCOUNTER — Telehealth: Payer: Self-pay | Admitting: Neurology

## 2018-07-01 NOTE — Telephone Encounter (Signed)
Received a notification for the patient that due to noncompliance they would be issuing a pick up ticket for the device.   "I am unable to obtain reauth for patient, due to non compliance. Please create a PU ticket."

## 2018-07-04 DIAGNOSIS — J3089 Other allergic rhinitis: Secondary | ICD-10-CM | POA: Diagnosis not present

## 2018-07-04 DIAGNOSIS — J301 Allergic rhinitis due to pollen: Secondary | ICD-10-CM | POA: Diagnosis not present

## 2018-07-04 DIAGNOSIS — J3081 Allergic rhinitis due to animal (cat) (dog) hair and dander: Secondary | ICD-10-CM | POA: Diagnosis not present

## 2018-07-05 DIAGNOSIS — F4010 Social phobia, unspecified: Secondary | ICD-10-CM | POA: Diagnosis not present

## 2018-07-05 DIAGNOSIS — F909 Attention-deficit hyperactivity disorder, unspecified type: Secondary | ICD-10-CM | POA: Diagnosis not present

## 2018-07-05 DIAGNOSIS — F3130 Bipolar disorder, current episode depressed, mild or moderate severity, unspecified: Secondary | ICD-10-CM | POA: Diagnosis not present

## 2018-07-09 DIAGNOSIS — G43719 Chronic migraine without aura, intractable, without status migrainosus: Secondary | ICD-10-CM | POA: Diagnosis not present

## 2018-07-11 DIAGNOSIS — G4733 Obstructive sleep apnea (adult) (pediatric): Secondary | ICD-10-CM | POA: Diagnosis not present

## 2018-07-18 DIAGNOSIS — J301 Allergic rhinitis due to pollen: Secondary | ICD-10-CM | POA: Diagnosis not present

## 2018-07-18 DIAGNOSIS — J3081 Allergic rhinitis due to animal (cat) (dog) hair and dander: Secondary | ICD-10-CM | POA: Diagnosis not present

## 2018-07-18 DIAGNOSIS — J3089 Other allergic rhinitis: Secondary | ICD-10-CM | POA: Diagnosis not present

## 2018-07-31 DIAGNOSIS — E282 Polycystic ovarian syndrome: Secondary | ICD-10-CM | POA: Diagnosis not present

## 2018-07-31 DIAGNOSIS — Z6832 Body mass index (BMI) 32.0-32.9, adult: Secondary | ICD-10-CM | POA: Diagnosis not present

## 2018-07-31 DIAGNOSIS — Z01419 Encounter for gynecological examination (general) (routine) without abnormal findings: Secondary | ICD-10-CM | POA: Diagnosis not present

## 2018-07-31 DIAGNOSIS — Z1231 Encounter for screening mammogram for malignant neoplasm of breast: Secondary | ICD-10-CM | POA: Diagnosis not present

## 2018-08-01 DIAGNOSIS — J301 Allergic rhinitis due to pollen: Secondary | ICD-10-CM | POA: Diagnosis not present

## 2018-08-01 DIAGNOSIS — J3089 Other allergic rhinitis: Secondary | ICD-10-CM | POA: Diagnosis not present

## 2018-08-01 DIAGNOSIS — J3081 Allergic rhinitis due to animal (cat) (dog) hair and dander: Secondary | ICD-10-CM | POA: Diagnosis not present

## 2018-08-06 DIAGNOSIS — G43719 Chronic migraine without aura, intractable, without status migrainosus: Secondary | ICD-10-CM | POA: Diagnosis not present

## 2018-08-08 DIAGNOSIS — F3130 Bipolar disorder, current episode depressed, mild or moderate severity, unspecified: Secondary | ICD-10-CM | POA: Diagnosis not present

## 2018-08-08 DIAGNOSIS — F909 Attention-deficit hyperactivity disorder, unspecified type: Secondary | ICD-10-CM | POA: Diagnosis not present

## 2018-08-08 DIAGNOSIS — F4010 Social phobia, unspecified: Secondary | ICD-10-CM | POA: Diagnosis not present

## 2018-08-13 DIAGNOSIS — Z1159 Encounter for screening for other viral diseases: Secondary | ICD-10-CM | POA: Diagnosis not present

## 2018-08-15 DIAGNOSIS — J3089 Other allergic rhinitis: Secondary | ICD-10-CM | POA: Diagnosis not present

## 2018-08-15 DIAGNOSIS — J3081 Allergic rhinitis due to animal (cat) (dog) hair and dander: Secondary | ICD-10-CM | POA: Diagnosis not present

## 2018-08-15 DIAGNOSIS — J301 Allergic rhinitis due to pollen: Secondary | ICD-10-CM | POA: Diagnosis not present

## 2018-08-29 DIAGNOSIS — J3089 Other allergic rhinitis: Secondary | ICD-10-CM | POA: Diagnosis not present

## 2018-08-29 DIAGNOSIS — J301 Allergic rhinitis due to pollen: Secondary | ICD-10-CM | POA: Diagnosis not present

## 2018-08-29 DIAGNOSIS — J3081 Allergic rhinitis due to animal (cat) (dog) hair and dander: Secondary | ICD-10-CM | POA: Diagnosis not present

## 2018-09-03 DIAGNOSIS — G43719 Chronic migraine without aura, intractable, without status migrainosus: Secondary | ICD-10-CM | POA: Diagnosis not present

## 2018-09-03 DIAGNOSIS — M542 Cervicalgia: Secondary | ICD-10-CM | POA: Diagnosis not present

## 2018-09-03 DIAGNOSIS — G4733 Obstructive sleep apnea (adult) (pediatric): Secondary | ICD-10-CM | POA: Diagnosis not present

## 2018-09-05 DIAGNOSIS — J301 Allergic rhinitis due to pollen: Secondary | ICD-10-CM | POA: Diagnosis not present

## 2018-09-05 DIAGNOSIS — J4520 Mild intermittent asthma, uncomplicated: Secondary | ICD-10-CM | POA: Diagnosis not present

## 2018-09-05 DIAGNOSIS — J3089 Other allergic rhinitis: Secondary | ICD-10-CM | POA: Diagnosis not present

## 2018-09-05 DIAGNOSIS — J3081 Allergic rhinitis due to animal (cat) (dog) hair and dander: Secondary | ICD-10-CM | POA: Diagnosis not present

## 2018-09-10 DIAGNOSIS — G43719 Chronic migraine without aura, intractable, without status migrainosus: Secondary | ICD-10-CM | POA: Diagnosis not present

## 2018-09-18 DIAGNOSIS — F4010 Social phobia, unspecified: Secondary | ICD-10-CM | POA: Diagnosis not present

## 2018-09-18 DIAGNOSIS — F3130 Bipolar disorder, current episode depressed, mild or moderate severity, unspecified: Secondary | ICD-10-CM | POA: Diagnosis not present

## 2018-09-18 DIAGNOSIS — F909 Attention-deficit hyperactivity disorder, unspecified type: Secondary | ICD-10-CM | POA: Diagnosis not present

## 2018-09-19 DIAGNOSIS — J3089 Other allergic rhinitis: Secondary | ICD-10-CM | POA: Diagnosis not present

## 2018-09-19 DIAGNOSIS — J3081 Allergic rhinitis due to animal (cat) (dog) hair and dander: Secondary | ICD-10-CM | POA: Diagnosis not present

## 2018-09-19 DIAGNOSIS — J301 Allergic rhinitis due to pollen: Secondary | ICD-10-CM | POA: Diagnosis not present

## 2018-10-02 DIAGNOSIS — F411 Generalized anxiety disorder: Secondary | ICD-10-CM | POA: Diagnosis not present

## 2018-10-09 DIAGNOSIS — J3081 Allergic rhinitis due to animal (cat) (dog) hair and dander: Secondary | ICD-10-CM | POA: Diagnosis not present

## 2018-10-09 DIAGNOSIS — J301 Allergic rhinitis due to pollen: Secondary | ICD-10-CM | POA: Diagnosis not present

## 2018-10-09 DIAGNOSIS — J3089 Other allergic rhinitis: Secondary | ICD-10-CM | POA: Diagnosis not present

## 2018-10-10 DIAGNOSIS — F411 Generalized anxiety disorder: Secondary | ICD-10-CM | POA: Diagnosis not present

## 2018-10-11 DIAGNOSIS — G43719 Chronic migraine without aura, intractable, without status migrainosus: Secondary | ICD-10-CM | POA: Diagnosis not present

## 2018-10-17 DIAGNOSIS — F411 Generalized anxiety disorder: Secondary | ICD-10-CM | POA: Diagnosis not present

## 2018-10-30 DIAGNOSIS — J3081 Allergic rhinitis due to animal (cat) (dog) hair and dander: Secondary | ICD-10-CM | POA: Diagnosis not present

## 2018-10-30 DIAGNOSIS — J301 Allergic rhinitis due to pollen: Secondary | ICD-10-CM | POA: Diagnosis not present

## 2018-10-30 DIAGNOSIS — J3089 Other allergic rhinitis: Secondary | ICD-10-CM | POA: Diagnosis not present

## 2018-10-31 DIAGNOSIS — F411 Generalized anxiety disorder: Secondary | ICD-10-CM | POA: Diagnosis not present

## 2018-11-05 DIAGNOSIS — G43719 Chronic migraine without aura, intractable, without status migrainosus: Secondary | ICD-10-CM | POA: Diagnosis not present

## 2018-11-13 DIAGNOSIS — J3081 Allergic rhinitis due to animal (cat) (dog) hair and dander: Secondary | ICD-10-CM | POA: Diagnosis not present

## 2018-11-13 DIAGNOSIS — J3089 Other allergic rhinitis: Secondary | ICD-10-CM | POA: Diagnosis not present

## 2018-11-13 DIAGNOSIS — J301 Allergic rhinitis due to pollen: Secondary | ICD-10-CM | POA: Diagnosis not present

## 2018-11-14 DIAGNOSIS — F411 Generalized anxiety disorder: Secondary | ICD-10-CM | POA: Diagnosis not present

## 2018-11-19 DIAGNOSIS — J301 Allergic rhinitis due to pollen: Secondary | ICD-10-CM | POA: Diagnosis not present

## 2018-11-19 DIAGNOSIS — J3081 Allergic rhinitis due to animal (cat) (dog) hair and dander: Secondary | ICD-10-CM | POA: Diagnosis not present

## 2018-11-25 ENCOUNTER — Encounter: Payer: Self-pay | Admitting: Neurology

## 2018-11-27 ENCOUNTER — Ambulatory Visit: Payer: BC Managed Care – PPO | Admitting: Neurology

## 2018-11-27 ENCOUNTER — Other Ambulatory Visit: Payer: Self-pay

## 2018-11-27 ENCOUNTER — Encounter: Payer: Self-pay | Admitting: Neurology

## 2018-11-27 VITALS — BP 128/83 | HR 111 | Temp 97.3°F | Ht 61.0 in | Wt 182.0 lb

## 2018-11-27 DIAGNOSIS — G478 Other sleep disorders: Secondary | ICD-10-CM

## 2018-11-27 DIAGNOSIS — R51 Headache: Secondary | ICD-10-CM

## 2018-11-27 DIAGNOSIS — Z789 Other specified health status: Secondary | ICD-10-CM

## 2018-11-27 DIAGNOSIS — F5105 Insomnia due to other mental disorder: Secondary | ICD-10-CM

## 2018-11-27 DIAGNOSIS — R519 Headache, unspecified: Secondary | ICD-10-CM

## 2018-11-27 DIAGNOSIS — Z9989 Dependence on other enabling machines and devices: Secondary | ICD-10-CM

## 2018-11-27 DIAGNOSIS — G4733 Obstructive sleep apnea (adult) (pediatric): Secondary | ICD-10-CM

## 2018-11-27 DIAGNOSIS — F99 Mental disorder, not otherwise specified: Secondary | ICD-10-CM

## 2018-11-27 DIAGNOSIS — E669 Obesity, unspecified: Secondary | ICD-10-CM | POA: Diagnosis not present

## 2018-11-27 DIAGNOSIS — R0682 Tachypnea, not elsewhere classified: Secondary | ICD-10-CM

## 2018-11-27 NOTE — Progress Notes (Signed)
PATIENT: [name] Park DOB: 06-08-1975  REASON FOR VISIT: follow up [name] FROM: patient  [name] OF PRESENT ILLNESS: Today 11/27/18:[name] Park is a 43 year old female with a [name] of obstructive sleep apnea on CPAP.  [name] Park is a 43 year old Caucasian female patient is a [name] of obstructive sleep apnea I would like to add that her apnea was fairly mild.  The patient's overall apnea-hypopnea index was 7.1.  We discussed today her problems with using CPAP but she has only managed for 1 out of 30 nights in the last 30 days.  If she uses CPAP her AHI is very low but she does suffer from claustrophobia and feels restrained by the device and interface.  We discussed alternatives to CPAP today such as a dental device or maybe even an inspire procedure.  For the inspire procedure however she would have to reach a BMI of 32 or less.  Current BMI is 34 so this will be manageable within a couple of months if intended.  The dental device could and generally be used for mild apnea but her apnea was strongly dependent on REM sleep and REM AHI is usually not corrected by a dental device and may not be corrected by inspire procedure as well.  1 of the main reductions in AHI 10 be achieved by just not sleeping on her back showed that in supine sleep position her AHI was 7.6 and then she slept on her side her AHI was 2.5.  This may be the best treatment for avoiding supine sleep. A dental device can reduce only NREM sleep apnea. We also discussed tennis ball methode. I would like her to be as comfortable as possible and given allergic rhinitis, and claustrophobia, and CPA has forced her to sleep more supine. Inspire procedure is not indicated for such mild apnea, I feel.    06-26-2018/ Her CPAP download today indicates that she use her machine 30 out of 30 days for compliance of 100%.  Use her machine greater than 4 hours each night.  On average she uses her machine 6 hours and 23 minutes.  Her  residual AHI is 0.6 on 5-12 cmH2O with EPR of 3.  She does not have a significant leak.  She states that she has been trying to get acclimated to the machine therefore she is really not noticed much.  Her Epworth sleepiness score is 11.  And fatigue severity score is 36.  She returns today for evaluation.  [name] Park is a 43 y.o. female , seen here as in a referral  from NP O'Neal and here for a sleep evaluation.   The patient carries the diagnosis of bipolar disorder type 2 , ADD, OCD, obesity, frequent sinusitis, and nasal congestion and visibly very small lower jaw. Hypoplastic sinus structures, and longstanding sleep problems.  Chief complaint according to patient :Trouble falling asleep,her thoughts would be racing, and she would be anxious and unable to relax.  Seroquel and melatonin are used for sleep induction.  She wakes up frequently at night, and in the morning is not well rested.    Update 06-24-2018 by telephone- Snoring was noted. EKG was in keeping with tachycardia in sinus rhythm (NSR). Post-study, the patient indicated that sleep was better than usual.    IMPRESSION:  1. Mild Obstructive Sleep Apnea (OSA) with strong REM accentuation- No prolonged hypoxemia or hypercapnia was recorded.  2. Primary Snoring. 3. Low sleep efficiency.   RECOMMENDATIONS:  1. Advise full-night, attended,  CPAP titration study to optimize therapy.  2. If CPAP cannot be tolerated ( see my clinic notes ) will recommend further weight loss and [facility].     REVIEW OF SYSTEMS: Out of a complete 14 system review of symptoms, the patient complains only of the following symptoms, and all other reviewed systems are negative.  How likely are you to doze in the following situations: 0 = not likely, 1 = slight chance, 2 = moderate chance, 3 = high chance  Sitting and Reading? Watching Television? Sitting inactive in a public place (theater or meeting)? Lying  down in the afternoon when circumstances permit? Sitting and talking to someone? Sitting quietly after lunch without alcohol? In a car, while stopped for a few minutes in traffic? As a passenger in a car for an hour without a break?  Total = 10/ 24   FSS 28/ 63  While non compliant with CPAP.    ALLERGIES: Allergies  Allergen Reactions  . Emgality [Galcanezumab-Gnlm]     Not effective.  10-30-17  . Prednisone     Mood alterations  . Latex Rash    HOME MEDICATIONS: Outpatient Medications Prior to Visit  Medication Sig Dispense Refill  . albuterol (PROVENTIL HFA;VENTOLIN HFA) 108 (90 BASE) MCG/ACT inhaler Inhale 2 puffs into the lungs every 6 (six) hours as needed for wheezing.    . Ascorbic Acid (VITAMIN C) 1000 MG tablet Take 1,000 mg by mouth daily.    . baclofen (LIORESAL) 10 MG tablet Take 10 mg by mouth daily as needed (for migraines). Only allowed Monday through Friday as needed    . cetirizine (ZYRTEC) 10 MG tablet Take 10 mg by mouth daily.    . chlorproMAZINE (THORAZINE) 25 MG tablet Take 25 mg by mouth daily as needed.     . fluticasone (VERAMYST) 27.5 MCG/SPRAY nasal spray Place 2 sprays into the nose daily as needed for allergies.    Marland Kitchen. ibuprofen (ADVIL,MOTRIN) 200 MG tablet Take 400 mg by mouth every 6 (six) hours as needed (for back pain).    . Melatonin 3 MG TABS Take 9 mg by mouth at bedtime.    . metFORMIN (GLUCOPHAGE) 500 MG tablet Take 500 mg by mouth 2 (two) times daily with a meal.     . montelukast (SINGULAIR) 10 MG tablet Take 10 mg by mouth every evening.    Marland Kitchen. PRESCRIPTION MEDICATION Apply 1 application topically. For facial rosacea    . PRESCRIPTION MEDICATION Birth control    . QUEtiapine (SEROQUEL) 400 MG tablet Take 800 mg by mouth at bedtime. Take 2 at bedtime    . spironolactone (ALDACTONE) 100 MG tablet Take 50 mg by mouth daily.    Marland Kitchen. SYNTHROID 50 MCG tablet   0  . Fremanezumab-vfrm (AJOVY) 225 MG/1.5ML SOSY Inject 1 Syringe into the skin every 30  (thirty) days.     No facility-administered medications prior to visit.     PAST MEDICAL [name]: Past Medical [name]:  Diagnosis Date  . ADHD (attention deficit hyperactivity disorder)   . Anxiety   . Bipolar 1 disorder (HCC)   . Carpal tunnel syndrome, bilateral   . Environmental allergies   . Migraine   . OCD (obsessive compulsive disorder)   . Polycystic ovarian disease     PAST SURGICAL [name]: Past Surgical [name]:  Procedure Laterality Date  . EYE SURGERY    . TONSILECTOMY, ADENOIDECTOMY, BILATERAL MYRINGOTOMY AND TUBES    . TONSILLECTOMY  FAMILY [name]: No family [name] on file.  SOCIAL [name]: Social [name]   Socioeconomic [name]  . Marital status: Single    Spouse name: Not on file  . Number of children: Not on file  . Years of education: Not on file  . Highest education level: Not on file  Occupational [name]  . Not on file  Social Needs  . Financial resource strain: Not on file  . Food insecurity    Worry: Not on file    Inability: Not on file  . Transportation needs    Medical: Not on file    Non-medical: Not on file  Tobacco Use  . Smoking status: Never Smoker  . Smokeless tobacco: Never Used  Substance and Sexual Activity  . Alcohol use: Yes    Alcohol/week: 7.0 standard drinks    Types: 7 Glasses of wine per week    Comment: up to 12 oz daily of wine or beer.  . Drug use: No  . Sexual activity: Not Currently    Birth control/protection: Pill  Lifestyle  . Physical activity    Days per week: Not on file    Minutes per session: Not on file  . Stress: Not on file  Relationships  . Social Herbalist on phone: Not on file    Gets together: Not on file    Attends religious service: Not on file    Active member of club or organization: Not on file    Attends meetings of clubs or organizations: Not on file    Relationship status: Not on file  . Intimate partner violence    Fear of current or ex partner: Not on  file    Emotionally abused: Not on file    Physically abused: Not on file    Forced sexual activity: Not on file  Other Topics Concern  . Not on file  Social [name] Narrative  . Not on file      PHYSICAL EXAM  Vitals:   11/27/18 0927  BP: 128/83  Pulse: (!) 111  Temp: (!) 97.3 F (36.3 C)  Weight: 182 lb (82.6 kg)  Height: 5\' 1"  (1.549 m)   Body mass index is 34.39 kg/m.  Generalized: Well developed, in no acute distress , but tachycardiac.  Neck size 21 " , BMI 34.4 kg/m2  Neurological examination  Mentation: Alert oriented to time, place, [name] taking,  and language is fluent Cranial nerve : no loss of taste or smell. Pupils were equal round reactive to light. Extraocular movements were full,. Facial sensation and strength were normal.  Tongue in midline.  Head turning and shoulder shrug were normal and symmetric. Neck circumference 18 inches,  Motor: The motor testing reveals 5 over 5 strength -symmetric motor tone is noted throughout.  Sensory: deferred.  Coordination: finger-nose-bilaterally  Gait and station: Gait is normal.  Reflexes: Deep tendon reflexes are symmetric bilaterally.   DIAGNOSTIC DATA (LABS, IMAGING, TESTING) - I reviewed patient records, labs, notes, testing and imaging myself where available.  CPAP compliance is a challenge. Claustrophobia.  1 day use of 30 days of CPAP recording, 11-27-2018   ASSESSMENT AND PLAN 43 y.o. year old female  has a past medical [name] of ADHD (attention deficit hyperactivity disorder), Anxiety, Bipolar 1 disorder ([place]), Carpal tunnel syndrome, bilateral, Environmental allergies, Migraine, OCD (obsessive compulsive disorder), and Polycystic ovarian disease. here with :  1.  Obstructive sleep apnea on CPAP, non compliant due to claustrophobia, forced to sleep supine.  The patient CPAP shows now a poor  compliance after initially  good tolerance and successful treatment of her apnea.  She is encouraged to see a  dentist for a dental device, and to apply the tennisball methode.   I spent 15 minutes with the patient. 50% of this time was spent reviewing her CPAP download.   Melvyn Novas, MD    11/27/2018, 9:53 AM [facility] [address], [address] [place], [place] [address] [phone]

## 2018-11-28 DIAGNOSIS — F411 Generalized anxiety disorder: Secondary | ICD-10-CM | POA: Diagnosis not present

## 2018-12-03 DIAGNOSIS — G43719 Chronic migraine without aura, intractable, without status migrainosus: Secondary | ICD-10-CM | POA: Diagnosis not present

## 2018-12-03 DIAGNOSIS — M542 Cervicalgia: Secondary | ICD-10-CM | POA: Diagnosis not present

## 2018-12-05 DIAGNOSIS — J301 Allergic rhinitis due to pollen: Secondary | ICD-10-CM | POA: Diagnosis not present

## 2018-12-05 DIAGNOSIS — J3081 Allergic rhinitis due to animal (cat) (dog) hair and dander: Secondary | ICD-10-CM | POA: Diagnosis not present

## 2018-12-05 DIAGNOSIS — J3089 Other allergic rhinitis: Secondary | ICD-10-CM | POA: Diagnosis not present

## 2018-12-10 DIAGNOSIS — F411 Generalized anxiety disorder: Secondary | ICD-10-CM | POA: Diagnosis not present

## 2018-12-11 DIAGNOSIS — F331 Major depressive disorder, recurrent, moderate: Secondary | ICD-10-CM | POA: Diagnosis not present

## 2018-12-17 DIAGNOSIS — R0981 Nasal congestion: Secondary | ICD-10-CM | POA: Diagnosis not present

## 2018-12-17 DIAGNOSIS — R05 Cough: Secondary | ICD-10-CM | POA: Diagnosis not present

## 2018-12-17 DIAGNOSIS — B9689 Other specified bacterial agents as the cause of diseases classified elsewhere: Secondary | ICD-10-CM | POA: Diagnosis not present

## 2018-12-17 DIAGNOSIS — Z20828 Contact with and (suspected) exposure to other viral communicable diseases: Secondary | ICD-10-CM | POA: Diagnosis not present

## 2018-12-17 DIAGNOSIS — J329 Chronic sinusitis, unspecified: Secondary | ICD-10-CM | POA: Diagnosis not present

## 2018-12-17 DIAGNOSIS — R070 Pain in throat: Secondary | ICD-10-CM | POA: Diagnosis not present

## 2018-12-25 DIAGNOSIS — J3089 Other allergic rhinitis: Secondary | ICD-10-CM | POA: Diagnosis not present

## 2018-12-25 DIAGNOSIS — J3081 Allergic rhinitis due to animal (cat) (dog) hair and dander: Secondary | ICD-10-CM | POA: Diagnosis not present

## 2018-12-25 DIAGNOSIS — J301 Allergic rhinitis due to pollen: Secondary | ICD-10-CM | POA: Diagnosis not present

## 2018-12-27 DIAGNOSIS — R05 Cough: Secondary | ICD-10-CM | POA: Diagnosis not present

## 2018-12-28 DIAGNOSIS — J018 Other acute sinusitis: Secondary | ICD-10-CM | POA: Diagnosis not present

## 2018-12-31 DIAGNOSIS — F411 Generalized anxiety disorder: Secondary | ICD-10-CM | POA: Diagnosis not present

## 2019-01-01 DIAGNOSIS — J301 Allergic rhinitis due to pollen: Secondary | ICD-10-CM | POA: Diagnosis not present

## 2019-01-01 DIAGNOSIS — J3081 Allergic rhinitis due to animal (cat) (dog) hair and dander: Secondary | ICD-10-CM | POA: Diagnosis not present

## 2019-01-01 DIAGNOSIS — J3089 Other allergic rhinitis: Secondary | ICD-10-CM | POA: Diagnosis not present

## 2019-01-02 DIAGNOSIS — G4733 Obstructive sleep apnea (adult) (pediatric): Secondary | ICD-10-CM | POA: Diagnosis not present

## 2019-01-08 DIAGNOSIS — J309 Allergic rhinitis, unspecified: Secondary | ICD-10-CM | POA: Diagnosis not present

## 2019-01-14 DIAGNOSIS — J3089 Other allergic rhinitis: Secondary | ICD-10-CM | POA: Diagnosis not present

## 2019-01-14 DIAGNOSIS — J3081 Allergic rhinitis due to animal (cat) (dog) hair and dander: Secondary | ICD-10-CM | POA: Diagnosis not present

## 2019-01-14 DIAGNOSIS — J301 Allergic rhinitis due to pollen: Secondary | ICD-10-CM | POA: Diagnosis not present

## 2019-01-16 DIAGNOSIS — J301 Allergic rhinitis due to pollen: Secondary | ICD-10-CM | POA: Diagnosis not present

## 2019-01-16 DIAGNOSIS — J3081 Allergic rhinitis due to animal (cat) (dog) hair and dander: Secondary | ICD-10-CM | POA: Diagnosis not present

## 2019-01-16 DIAGNOSIS — J3089 Other allergic rhinitis: Secondary | ICD-10-CM | POA: Diagnosis not present

## 2019-01-21 DIAGNOSIS — F411 Generalized anxiety disorder: Secondary | ICD-10-CM | POA: Diagnosis not present

## 2019-01-23 DIAGNOSIS — J3081 Allergic rhinitis due to animal (cat) (dog) hair and dander: Secondary | ICD-10-CM | POA: Diagnosis not present

## 2019-01-23 DIAGNOSIS — J301 Allergic rhinitis due to pollen: Secondary | ICD-10-CM | POA: Diagnosis not present

## 2019-01-23 DIAGNOSIS — J3089 Other allergic rhinitis: Secondary | ICD-10-CM | POA: Diagnosis not present

## 2019-01-24 DIAGNOSIS — E039 Hypothyroidism, unspecified: Secondary | ICD-10-CM | POA: Diagnosis not present

## 2019-01-28 DIAGNOSIS — G43719 Chronic migraine without aura, intractable, without status migrainosus: Secondary | ICD-10-CM | POA: Diagnosis not present

## 2019-01-29 DIAGNOSIS — J3089 Other allergic rhinitis: Secondary | ICD-10-CM | POA: Diagnosis not present

## 2019-01-29 DIAGNOSIS — J3081 Allergic rhinitis due to animal (cat) (dog) hair and dander: Secondary | ICD-10-CM | POA: Diagnosis not present

## 2019-01-29 DIAGNOSIS — J301 Allergic rhinitis due to pollen: Secondary | ICD-10-CM | POA: Diagnosis not present

## 2019-02-04 DIAGNOSIS — G43719 Chronic migraine without aura, intractable, without status migrainosus: Secondary | ICD-10-CM | POA: Diagnosis not present

## 2019-02-04 DIAGNOSIS — M542 Cervicalgia: Secondary | ICD-10-CM | POA: Diagnosis not present

## 2019-02-04 DIAGNOSIS — F411 Generalized anxiety disorder: Secondary | ICD-10-CM | POA: Diagnosis not present

## 2019-02-05 DIAGNOSIS — J3089 Other allergic rhinitis: Secondary | ICD-10-CM | POA: Diagnosis not present

## 2019-02-05 DIAGNOSIS — J3081 Allergic rhinitis due to animal (cat) (dog) hair and dander: Secondary | ICD-10-CM | POA: Diagnosis not present

## 2019-02-05 DIAGNOSIS — J301 Allergic rhinitis due to pollen: Secondary | ICD-10-CM | POA: Diagnosis not present

## 2019-02-19 DIAGNOSIS — J3081 Allergic rhinitis due to animal (cat) (dog) hair and dander: Secondary | ICD-10-CM | POA: Diagnosis not present

## 2019-02-19 DIAGNOSIS — J301 Allergic rhinitis due to pollen: Secondary | ICD-10-CM | POA: Diagnosis not present

## 2019-02-19 DIAGNOSIS — J3089 Other allergic rhinitis: Secondary | ICD-10-CM | POA: Diagnosis not present

## 2019-02-25 DIAGNOSIS — F411 Generalized anxiety disorder: Secondary | ICD-10-CM | POA: Diagnosis not present

## 2019-03-12 DIAGNOSIS — J3081 Allergic rhinitis due to animal (cat) (dog) hair and dander: Secondary | ICD-10-CM | POA: Diagnosis not present

## 2019-03-12 DIAGNOSIS — J301 Allergic rhinitis due to pollen: Secondary | ICD-10-CM | POA: Diagnosis not present

## 2019-03-12 DIAGNOSIS — J3089 Other allergic rhinitis: Secondary | ICD-10-CM | POA: Diagnosis not present

## 2019-03-18 DIAGNOSIS — M542 Cervicalgia: Secondary | ICD-10-CM | POA: Diagnosis not present

## 2019-03-18 DIAGNOSIS — G43719 Chronic migraine without aura, intractable, without status migrainosus: Secondary | ICD-10-CM | POA: Diagnosis not present

## 2019-03-19 DIAGNOSIS — F411 Generalized anxiety disorder: Secondary | ICD-10-CM | POA: Diagnosis not present

## 2019-04-01 DIAGNOSIS — J3089 Other allergic rhinitis: Secondary | ICD-10-CM | POA: Diagnosis not present

## 2019-04-01 DIAGNOSIS — J301 Allergic rhinitis due to pollen: Secondary | ICD-10-CM | POA: Diagnosis not present

## 2019-04-01 DIAGNOSIS — J3081 Allergic rhinitis due to animal (cat) (dog) hair and dander: Secondary | ICD-10-CM | POA: Diagnosis not present

## 2019-04-04 DIAGNOSIS — J3089 Other allergic rhinitis: Secondary | ICD-10-CM | POA: Diagnosis not present

## 2019-04-04 DIAGNOSIS — J301 Allergic rhinitis due to pollen: Secondary | ICD-10-CM | POA: Diagnosis not present

## 2019-04-04 DIAGNOSIS — J3081 Allergic rhinitis due to animal (cat) (dog) hair and dander: Secondary | ICD-10-CM | POA: Diagnosis not present

## 2019-04-04 DIAGNOSIS — J4520 Mild intermittent asthma, uncomplicated: Secondary | ICD-10-CM | POA: Diagnosis not present

## 2019-04-08 DIAGNOSIS — F3189 Other bipolar disorder: Secondary | ICD-10-CM | POA: Diagnosis not present

## 2019-04-09 DIAGNOSIS — F411 Generalized anxiety disorder: Secondary | ICD-10-CM | POA: Diagnosis not present

## 2019-04-09 DIAGNOSIS — F3189 Other bipolar disorder: Secondary | ICD-10-CM | POA: Diagnosis not present

## 2019-04-22 DIAGNOSIS — G43719 Chronic migraine without aura, intractable, without status migrainosus: Secondary | ICD-10-CM | POA: Diagnosis not present

## 2019-04-23 DIAGNOSIS — J3089 Other allergic rhinitis: Secondary | ICD-10-CM | POA: Diagnosis not present

## 2019-04-23 DIAGNOSIS — J3081 Allergic rhinitis due to animal (cat) (dog) hair and dander: Secondary | ICD-10-CM | POA: Diagnosis not present

## 2019-04-23 DIAGNOSIS — J301 Allergic rhinitis due to pollen: Secondary | ICD-10-CM | POA: Diagnosis not present

## 2019-04-29 DIAGNOSIS — M542 Cervicalgia: Secondary | ICD-10-CM | POA: Diagnosis not present

## 2019-04-29 DIAGNOSIS — G43719 Chronic migraine without aura, intractable, without status migrainosus: Secondary | ICD-10-CM | POA: Diagnosis not present

## 2019-04-30 DIAGNOSIS — F411 Generalized anxiety disorder: Secondary | ICD-10-CM | POA: Diagnosis not present

## 2019-05-14 DIAGNOSIS — F411 Generalized anxiety disorder: Secondary | ICD-10-CM | POA: Diagnosis not present

## 2019-05-15 DIAGNOSIS — J3081 Allergic rhinitis due to animal (cat) (dog) hair and dander: Secondary | ICD-10-CM | POA: Diagnosis not present

## 2019-05-15 DIAGNOSIS — J3089 Other allergic rhinitis: Secondary | ICD-10-CM | POA: Diagnosis not present

## 2019-05-15 DIAGNOSIS — J301 Allergic rhinitis due to pollen: Secondary | ICD-10-CM | POA: Diagnosis not present

## 2019-05-22 DIAGNOSIS — R05 Cough: Secondary | ICD-10-CM | POA: Diagnosis not present

## 2019-05-22 DIAGNOSIS — J0100 Acute maxillary sinusitis, unspecified: Secondary | ICD-10-CM | POA: Diagnosis not present

## 2019-05-22 DIAGNOSIS — H5213 Myopia, bilateral: Secondary | ICD-10-CM | POA: Diagnosis not present

## 2019-05-22 DIAGNOSIS — H5010 Unspecified exotropia: Secondary | ICD-10-CM | POA: Diagnosis not present

## 2019-05-22 DIAGNOSIS — Z20822 Contact with and (suspected) exposure to covid-19: Secondary | ICD-10-CM | POA: Diagnosis not present

## 2019-06-03 DIAGNOSIS — F411 Generalized anxiety disorder: Secondary | ICD-10-CM | POA: Diagnosis not present

## 2019-06-04 DIAGNOSIS — J3089 Other allergic rhinitis: Secondary | ICD-10-CM | POA: Diagnosis not present

## 2019-06-04 DIAGNOSIS — J3081 Allergic rhinitis due to animal (cat) (dog) hair and dander: Secondary | ICD-10-CM | POA: Diagnosis not present

## 2019-06-04 DIAGNOSIS — J301 Allergic rhinitis due to pollen: Secondary | ICD-10-CM | POA: Diagnosis not present

## 2019-06-09 DIAGNOSIS — L718 Other rosacea: Secondary | ICD-10-CM | POA: Diagnosis not present

## 2019-06-09 DIAGNOSIS — L301 Dyshidrosis [pompholyx]: Secondary | ICD-10-CM | POA: Diagnosis not present

## 2019-06-10 DIAGNOSIS — G43719 Chronic migraine without aura, intractable, without status migrainosus: Secondary | ICD-10-CM | POA: Diagnosis not present

## 2019-06-10 DIAGNOSIS — M542 Cervicalgia: Secondary | ICD-10-CM | POA: Diagnosis not present

## 2019-06-17 DIAGNOSIS — J301 Allergic rhinitis due to pollen: Secondary | ICD-10-CM | POA: Diagnosis not present

## 2019-06-17 DIAGNOSIS — J3089 Other allergic rhinitis: Secondary | ICD-10-CM | POA: Diagnosis not present

## 2019-06-17 DIAGNOSIS — J3081 Allergic rhinitis due to animal (cat) (dog) hair and dander: Secondary | ICD-10-CM | POA: Diagnosis not present

## 2019-06-25 DIAGNOSIS — F411 Generalized anxiety disorder: Secondary | ICD-10-CM | POA: Diagnosis not present

## 2019-07-02 DIAGNOSIS — J301 Allergic rhinitis due to pollen: Secondary | ICD-10-CM | POA: Diagnosis not present

## 2019-07-02 DIAGNOSIS — J3081 Allergic rhinitis due to animal (cat) (dog) hair and dander: Secondary | ICD-10-CM | POA: Diagnosis not present

## 2019-07-02 DIAGNOSIS — J3089 Other allergic rhinitis: Secondary | ICD-10-CM | POA: Diagnosis not present

## 2019-07-15 DIAGNOSIS — G43719 Chronic migraine without aura, intractable, without status migrainosus: Secondary | ICD-10-CM | POA: Diagnosis not present

## 2019-07-15 DIAGNOSIS — J3089 Other allergic rhinitis: Secondary | ICD-10-CM | POA: Diagnosis not present

## 2019-07-15 DIAGNOSIS — J3081 Allergic rhinitis due to animal (cat) (dog) hair and dander: Secondary | ICD-10-CM | POA: Diagnosis not present

## 2019-07-15 DIAGNOSIS — J301 Allergic rhinitis due to pollen: Secondary | ICD-10-CM | POA: Diagnosis not present

## 2019-07-17 DIAGNOSIS — F411 Generalized anxiety disorder: Secondary | ICD-10-CM | POA: Diagnosis not present

## 2019-07-22 DIAGNOSIS — M542 Cervicalgia: Secondary | ICD-10-CM | POA: Diagnosis not present

## 2019-07-22 DIAGNOSIS — G43719 Chronic migraine without aura, intractable, without status migrainosus: Secondary | ICD-10-CM | POA: Diagnosis not present

## 2019-07-29 DIAGNOSIS — J301 Allergic rhinitis due to pollen: Secondary | ICD-10-CM | POA: Diagnosis not present

## 2019-07-29 DIAGNOSIS — J3081 Allergic rhinitis due to animal (cat) (dog) hair and dander: Secondary | ICD-10-CM | POA: Diagnosis not present

## 2019-07-29 DIAGNOSIS — J3089 Other allergic rhinitis: Secondary | ICD-10-CM | POA: Diagnosis not present

## 2019-08-06 DIAGNOSIS — F3189 Other bipolar disorder: Secondary | ICD-10-CM | POA: Diagnosis not present

## 2019-08-12 DIAGNOSIS — J301 Allergic rhinitis due to pollen: Secondary | ICD-10-CM | POA: Diagnosis not present

## 2019-08-12 DIAGNOSIS — J3089 Other allergic rhinitis: Secondary | ICD-10-CM | POA: Diagnosis not present

## 2019-08-12 DIAGNOSIS — J3081 Allergic rhinitis due to animal (cat) (dog) hair and dander: Secondary | ICD-10-CM | POA: Diagnosis not present

## 2019-08-14 DIAGNOSIS — F411 Generalized anxiety disorder: Secondary | ICD-10-CM | POA: Diagnosis not present

## 2019-08-28 DIAGNOSIS — F411 Generalized anxiety disorder: Secondary | ICD-10-CM | POA: Diagnosis not present

## 2019-09-02 DIAGNOSIS — J301 Allergic rhinitis due to pollen: Secondary | ICD-10-CM | POA: Diagnosis not present

## 2019-09-02 DIAGNOSIS — M542 Cervicalgia: Secondary | ICD-10-CM | POA: Diagnosis not present

## 2019-09-02 DIAGNOSIS — G43719 Chronic migraine without aura, intractable, without status migrainosus: Secondary | ICD-10-CM | POA: Diagnosis not present

## 2019-09-02 DIAGNOSIS — J3089 Other allergic rhinitis: Secondary | ICD-10-CM | POA: Diagnosis not present

## 2019-09-02 DIAGNOSIS — J3081 Allergic rhinitis due to animal (cat) (dog) hair and dander: Secondary | ICD-10-CM | POA: Diagnosis not present

## 2019-09-11 DIAGNOSIS — E282 Polycystic ovarian syndrome: Secondary | ICD-10-CM | POA: Diagnosis not present

## 2019-09-11 DIAGNOSIS — E039 Hypothyroidism, unspecified: Secondary | ICD-10-CM | POA: Diagnosis not present

## 2019-09-11 DIAGNOSIS — Z6833 Body mass index (BMI) 33.0-33.9, adult: Secondary | ICD-10-CM | POA: Diagnosis not present

## 2019-09-11 DIAGNOSIS — Z01419 Encounter for gynecological examination (general) (routine) without abnormal findings: Secondary | ICD-10-CM | POA: Diagnosis not present

## 2019-09-11 DIAGNOSIS — Z1231 Encounter for screening mammogram for malignant neoplasm of breast: Secondary | ICD-10-CM | POA: Diagnosis not present

## 2019-09-23 DIAGNOSIS — J3081 Allergic rhinitis due to animal (cat) (dog) hair and dander: Secondary | ICD-10-CM | POA: Diagnosis not present

## 2019-09-23 DIAGNOSIS — J301 Allergic rhinitis due to pollen: Secondary | ICD-10-CM | POA: Diagnosis not present

## 2019-09-23 DIAGNOSIS — J3089 Other allergic rhinitis: Secondary | ICD-10-CM | POA: Diagnosis not present

## 2019-09-25 DIAGNOSIS — F411 Generalized anxiety disorder: Secondary | ICD-10-CM | POA: Diagnosis not present

## 2019-10-02 ENCOUNTER — Encounter (INDEPENDENT_AMBULATORY_CARE_PROVIDER_SITE_OTHER): Payer: Self-pay | Admitting: Family Medicine

## 2019-10-02 ENCOUNTER — Other Ambulatory Visit: Payer: Self-pay

## 2019-10-02 ENCOUNTER — Ambulatory Visit (INDEPENDENT_AMBULATORY_CARE_PROVIDER_SITE_OTHER): Payer: BC Managed Care – PPO | Admitting: Family Medicine

## 2019-10-02 VITALS — BP 123/85 | HR 107 | Temp 98.4°F | Ht 62.0 in | Wt 177.0 lb

## 2019-10-02 DIAGNOSIS — R5383 Other fatigue: Secondary | ICD-10-CM | POA: Diagnosis not present

## 2019-10-02 DIAGNOSIS — Z0289 Encounter for other administrative examinations: Secondary | ICD-10-CM

## 2019-10-02 DIAGNOSIS — G4733 Obstructive sleep apnea (adult) (pediatric): Secondary | ICD-10-CM

## 2019-10-02 DIAGNOSIS — E7849 Other hyperlipidemia: Secondary | ICD-10-CM | POA: Diagnosis not present

## 2019-10-02 DIAGNOSIS — R7303 Prediabetes: Secondary | ICD-10-CM

## 2019-10-02 DIAGNOSIS — Z9189 Other specified personal risk factors, not elsewhere classified: Secondary | ICD-10-CM

## 2019-10-02 DIAGNOSIS — Z1331 Encounter for screening for depression: Secondary | ICD-10-CM

## 2019-10-02 DIAGNOSIS — Z6832 Body mass index (BMI) 32.0-32.9, adult: Secondary | ICD-10-CM

## 2019-10-02 DIAGNOSIS — E038 Other specified hypothyroidism: Secondary | ICD-10-CM | POA: Diagnosis not present

## 2019-10-02 DIAGNOSIS — E669 Obesity, unspecified: Secondary | ICD-10-CM

## 2019-10-02 NOTE — Progress Notes (Signed)
Chief Complaint:   OBESITY [name] Park (MR# [id number]) is a 44 y.o. female who presents for evaluation and treatment of obesity and related comorbidities. Current BMI is Body mass index is 32.37 kg/m. [name] Park has been struggling with her weight for many years and has been unsuccessful in either losing weight, maintaining weight loss, or reaching her healthy weight goal.  [name] Park has a large amount of food dislikes, which will potentially limit her progress and ability to make healthy changes.  [name] Park is currently in the action stage of change and ready to dedicate time achieving and maintaining a healthier weight. [name] Park is interested in becoming our patient and working on intensive lifestyle modifications including (but not limited to) diet and exercise for weight loss.  [name] Park's habits were reviewed today and are as follows: Her family eats meals together, her desired weight loss is 37 lbs, she has been heavy most of her life, her heaviest weight ever was 182 pounds, she is a picky eater and doesn't like to eat healthier foods, she has significant food cravings issues, she snacks frequently in the evenings, she skips meals frequently, she is frequently drinking liquids with calories, she frequently makes poor food choices, she frequently eats larger portions than normal and she struggles with emotional eating.  Depression Screen [name] Park's Food and Mood (modified PHQ-9) score was 5.  Depression screen [facility] 2/9 10/02/2019  Decreased Interest 0  Down, Depressed, Hopeless 2  PHQ - 2 Score 2  Altered sleeping 1  Tired, decreased energy 2  Change in appetite 0  Feeling bad or failure about yourself  0  Trouble concentrating 0  Moving slowly or fidgety/restless 0  Suicidal thoughts 0  PHQ-9 Score 5  Difficult doing work/chores Not difficult at all   Subjective:   1. Other fatigue [name] Park admits to daytime somnolence and admits to waking up still tired. Patent has a history of  symptoms of daytime fatigue and morning headache. [name] Park generally gets 6 or 7 hours of sleep per night, and states that she has generally restful sleep. Snoring is present. Apneic episodes are not present. Epworth Sleepiness Score is 10.  2. Pre-diabetes [name] Park has a history of polycystic ovarian syndrome. She is on metformin high dose. She would like to improve with diet and weight loss.  3. Other specified hypothyroidism [name] Park is on Synthroid, and she notes fatigue.  4. Other hyperlipidemia [name] Park is not on statin, and she is attempting to improve with diet. She denies chest pain.  5. Obstructive sleep apnea [name] Park recent sleep study was done and shows mild obstructive sleep apnea. Weight loss was recommended.  6. At risk for heart disease [name] Park is at a higher than average risk for cardiovascular disease due to obesity.   Assessment/Plan:   1. Other fatigue [name] Park does feel that her weight is causing her energy to be lower than it should be. Fatigue may be related to obesity, depression or many other causes. Labs will be ordered, and in the meanwhile, [name] Park will focus on self care including making healthy food choices, increasing physical activity and focusing on stress reduction.  - EKG 12-Lead - CBC with Differential/Platelet - VITAMIN D 25 Hydroxy (Vit-D Deficiency, Fractures) - Vitamin B12 - Folate - T3 - T4, free - TSH  2. Pre-diabetes [name] Park will start her eating plan, and will continue to work on weight loss, exercise, and decreasing simple carbohydrates to help decrease the risk of diabetes. We will check labs today.  -  Hemoglobin A1c - Insulin, random  3. Other specified hypothyroidism Patient with long-standing hypothyroidism, and will continue Synthroid. [name] Park appears euthyroid. We will check labs today. Orders and follow up as documented in patient record.  4. Other hyperlipidemia Cardiovascular risk and specific lipid/LDL goals reviewed. We discussed  several lifestyle modifications today. [name] Park will start her eating plan, and will continue to work on exercise and weight loss efforts. We will check labs today. Orders and follow up as documented in patient record.   - Comprehensive metabolic panel - Lipid Panel With LDL/HDL Ratio  5. Obstructive sleep apnea Intensive lifestyle modifications are the first line treatment for this issue. We discussed several lifestyle modifications today. [name] Park will start her diet and will continue to work on exercise and weight loss efforts. We will continue to monitor. Orders and follow up as documented in patient record.   6. Depression screening [name] Park had a positive depression screening. Depression is commonly associated with obesity and often results in emotional eating behaviors. We will monitor this closely and work on CBT to help improve the non-hunger eating patterns. Referral to Psychology may be required if no improvement is seen as she continues in our clinic.  7. At risk for heart disease [name] Park was given approximately 30 minutes of coronary artery disease prevention counseling today. She is 44 y.o. female and has risk factors for heart disease including obesity. We discussed intensive lifestyle modifications today with an emphasis on specific weight loss instructions and strategies.   Repetitive spaced learning was employed today to elicit superior memory formation and behavioral change.  8. Class 1 obesity with serious comorbidity and body mass index (BMI) of 32.0 to 32.9 in adult, unspecified obesity type [name] Park is currently in the action stage of change and her goal is to continue with weight loss efforts. I recommend [name] Park begin the structured treatment plan as follows:  She has agreed to keeping a food journal and adhering to recommended goals of 1100-1300 calories and 75+ grams of protein daily.  Exercise goals: No exercise has been prescribed for now, while we concentrate of nutritional  changes.  Behavioral modification strategies: increasing lean protein intake, increasing vegetables and keeping a strict food journal.  She was informed of the importance of frequent follow-up visits to maximize her success with intensive lifestyle modifications for her multiple health conditions. She was informed we would discuss her lab results at her next visit unless there is a critical issue that needs to be addressed sooner. [name] Park agreed to keep her next visit at the agreed upon time to discuss these results.  Objective:   Blood pressure 123/85, pulse (!) 107, temperature 98.4 F (36.9 C), temperature source Oral, height 5\' 2"  (1.575 m), weight 177 lb (80.3 kg), SpO2 96 %. Body mass index is 32.37 kg/m.  EKG: Normal sinus rhythm, rate 99 BPM.  Indirect Calorimeter completed today shows a VO2 of 206 and a REE of 1433.  Her calculated basal metabolic rate is thus her basal metabolic rate is worse than expected.  General: Cooperative, alert, well developed, in no acute distress. HEENT: Conjunctivae and lids unremarkable. Cardiovascular: Regular rhythm.  Lungs: Normal work of breathing. Neurologic: No focal deficits.   No results found for: CREATININE, BUN, NA, K, CL, CO2 No results found for: ALT, AST, GGT, ALKPHOS, BILITOT No results found for: HGBA1C No results found for: INSULIN No results found for: TSH No results found for: CHOL, HDL, LDLCALC, LDLDIRECT, TRIG, CHOLHDL No results found for: WBC, HGB,  HCT, MCV, PLT No results found for: IRON, TIBC, FERRITIN  Attestation Statements:   Reviewed by clinician on day of visit: allergies, medications, problem list, medical history, surgical history, family history, social history, and previous encounter notes.   I, Burt Knack, am acting as transcriptionist for Quillian Quince, MD.  I have reviewed the above documentation for accuracy and completeness, and I agree with the above. - Quillian Quince, MD

## 2019-10-03 LAB — CBC WITH DIFFERENTIAL/PLATELET
Basophils Absolute: 0.1 10*3/uL (ref 0.0–0.2)
Basos: 1 %
EOS (ABSOLUTE): 0.4 10*3/uL (ref 0.0–0.4)
Eos: 6 %
Hematocrit: 39.1 % (ref 34.0–46.6)
Hemoglobin: 13.3 g/dL (ref 11.1–15.9)
Immature Grans (Abs): 0.1 10*3/uL (ref 0.0–0.1)
Immature Granulocytes: 1 %
Lymphocytes Absolute: 2.2 10*3/uL (ref 0.7–3.1)
Lymphs: 27 %
MCH: 30.2 pg (ref 26.6–33.0)
MCHC: 34 g/dL (ref 31.5–35.7)
MCV: 89 fL (ref 79–97)
Monocytes Absolute: 0.9 10*3/uL (ref 0.1–0.9)
Monocytes: 11 %
Neutrophils Absolute: 4.4 10*3/uL (ref 1.4–7.0)
Neutrophils: 54 %
Platelets: 306 10*3/uL (ref 150–450)
RBC: 4.41 x10E6/uL (ref 3.77–5.28)
RDW: 13.3 % (ref 11.7–15.4)
WBC: 8.1 10*3/uL (ref 3.4–10.8)

## 2019-10-03 LAB — COMPREHENSIVE METABOLIC PANEL
ALT: 29 IU/L (ref 0–32)
AST: 40 IU/L (ref 0–40)
Albumin/Globulin Ratio: 1.8 (ref 1.2–2.2)
Albumin: 4.4 g/dL (ref 3.8–4.8)
Alkaline Phosphatase: 87 IU/L (ref 48–121)
BUN/Creatinine Ratio: 19 (ref 9–23)
BUN: 13 mg/dL (ref 6–24)
Bilirubin Total: <0.2 mg/dL (ref 0.0–1.2)
CO2: 23 mmol/L (ref 20–29)
Calcium: 9.6 mg/dL (ref 8.7–10.2)
Chloride: 98 mmol/L (ref 96–106)
Creatinine, Ser: 0.69 mg/dL (ref 0.57–1.00)
GFR calc Af Amer: 124 mL/min/{1.73_m2} (ref >59)
GFR calc non Af Amer: 108 mL/min/{1.73_m2} (ref >59)
Globulin, Total: 2.4 g/dL (ref 1.5–4.5)
Glucose: 95 mg/dL (ref 65–99)
Potassium: 4.4 mmol/L (ref 3.5–5.2)
Sodium: 136 mmol/L (ref 134–144)
Total Protein: 6.8 g/dL (ref 6.0–8.5)

## 2019-10-03 LAB — HEMOGLOBIN A1C
Est. average glucose Bld gHb Est-mCnc: 126 mg/dL
Hgb A1c MFr Bld: 6 % — ABNORMAL HIGH (ref 4.8–5.6)

## 2019-10-03 LAB — LIPID PANEL WITH LDL/HDL RATIO
Cholesterol, Total: 193 mg/dL (ref 100–199)
HDL: 41 mg/dL (ref >39)
LDL Chol Calc (NIH): 87 mg/dL (ref 0–99)
LDL/HDL Ratio: 2.1 ratio (ref 0.0–3.2)
Triglycerides: 400 mg/dL — ABNORMAL HIGH (ref 0–149)
VLDL Cholesterol Cal: 65 mg/dL — ABNORMAL HIGH (ref 5–40)

## 2019-10-03 LAB — FOLATE: Folate: 2.6 ng/mL — ABNORMAL LOW (ref >3.0)

## 2019-10-03 LAB — TSH: TSH: 1.51 u[IU]/mL (ref 0.450–4.500)

## 2019-10-03 LAB — VITAMIN D 25 HYDROXY (VIT D DEFICIENCY, FRACTURES): Vit D, 25-Hydroxy: 27.7 ng/mL — ABNORMAL LOW (ref 30.0–100.0)

## 2019-10-03 LAB — T3: T3, Total: 133 ng/dL (ref 71–180)

## 2019-10-03 LAB — VITAMIN B12: Vitamin B-12: 353 pg/mL (ref 232–1245)

## 2019-10-03 LAB — T4, FREE: Free T4: 0.86 ng/dL (ref 0.82–1.77)

## 2019-10-03 LAB — INSULIN, RANDOM: INSULIN: 69.5 u[IU]/mL — ABNORMAL HIGH (ref 2.6–24.9)

## 2019-10-14 DIAGNOSIS — G43719 Chronic migraine without aura, intractable, without status migrainosus: Secondary | ICD-10-CM | POA: Diagnosis not present

## 2019-10-15 DIAGNOSIS — J3089 Other allergic rhinitis: Secondary | ICD-10-CM | POA: Diagnosis not present

## 2019-10-15 DIAGNOSIS — J3081 Allergic rhinitis due to animal (cat) (dog) hair and dander: Secondary | ICD-10-CM | POA: Diagnosis not present

## 2019-10-15 DIAGNOSIS — J301 Allergic rhinitis due to pollen: Secondary | ICD-10-CM | POA: Diagnosis not present

## 2019-10-16 ENCOUNTER — Other Ambulatory Visit: Payer: Self-pay

## 2019-10-16 ENCOUNTER — Ambulatory Visit (INDEPENDENT_AMBULATORY_CARE_PROVIDER_SITE_OTHER): Payer: BC Managed Care – PPO | Admitting: Family Medicine

## 2019-10-16 ENCOUNTER — Encounter (INDEPENDENT_AMBULATORY_CARE_PROVIDER_SITE_OTHER): Payer: Self-pay | Admitting: Family Medicine

## 2019-10-16 VITALS — BP 177/82 | HR 94 | Temp 97.7°F | Ht 62.0 in | Wt 176.0 lb

## 2019-10-16 DIAGNOSIS — E559 Vitamin D deficiency, unspecified: Secondary | ICD-10-CM | POA: Diagnosis not present

## 2019-10-16 DIAGNOSIS — R7303 Prediabetes: Secondary | ICD-10-CM

## 2019-10-16 DIAGNOSIS — Z9189 Other specified personal risk factors, not elsewhere classified: Secondary | ICD-10-CM | POA: Diagnosis not present

## 2019-10-16 DIAGNOSIS — Z6832 Body mass index (BMI) 32.0-32.9, adult: Secondary | ICD-10-CM

## 2019-10-16 DIAGNOSIS — E669 Obesity, unspecified: Secondary | ICD-10-CM

## 2019-10-16 MED ORDER — BD PEN NEEDLE NANO 2ND GEN 32G X 4 MM MISC: 1.0000 | Freq: Every day | 0 refills | Status: DC

## 2019-10-16 MED ORDER — SAXENDA 18 MG/3ML ~~LOC~~ SOPN: 3.0000 mg | PEN_INJECTOR | Freq: Every day | SUBCUTANEOUS | 0 refills | Status: DC

## 2019-10-16 MED ORDER — VITAMIN D (ERGOCALCIFEROL) 1.25 MG (50000 UNIT) PO CAPS: 50000.0000 [IU] | ORAL_CAPSULE | ORAL | 0 refills | Status: DC

## 2019-10-20 ENCOUNTER — Encounter (INDEPENDENT_AMBULATORY_CARE_PROVIDER_SITE_OTHER): Payer: Self-pay | Admitting: Family Medicine

## 2019-10-20 DIAGNOSIS — J301 Allergic rhinitis due to pollen: Secondary | ICD-10-CM | POA: Diagnosis not present

## 2019-10-20 DIAGNOSIS — J3081 Allergic rhinitis due to animal (cat) (dog) hair and dander: Secondary | ICD-10-CM | POA: Diagnosis not present

## 2019-10-21 DIAGNOSIS — J3089 Other allergic rhinitis: Secondary | ICD-10-CM | POA: Diagnosis not present

## 2019-10-21 DIAGNOSIS — M542 Cervicalgia: Secondary | ICD-10-CM | POA: Diagnosis not present

## 2019-10-21 DIAGNOSIS — G43719 Chronic migraine without aura, intractable, without status migrainosus: Secondary | ICD-10-CM | POA: Diagnosis not present

## 2019-10-21 NOTE — Progress Notes (Signed)
Chief Complaint:   OBESITY [name] Park is here to discuss her progress with her obesity treatment plan along with follow-up of her obesity related diagnoses. [name] Park is on keeping a food journal and adhering to recommended goals of 1100-1300 calories and 75+ grams of protein daily and states she is following her eating plan approximately 50-70% of the time. [name] Park states she is doing 0 minutes 0 times per week.  Today's visit was #: 2 Starting weight: 177 lbs Starting date: 10/02/2019 Today's weight: 176 lbs Today's date: 10/16/2019 Total lbs lost to date: 1 Total lbs lost since last in-office visit: 1  Interim History: [name] Park tried to journal but she struggled with some of the details. She notes hunger even on metformin.  Subjective:   1. Vitamin D deficiency [name] Park is not on Vit D, and her level is not at goal. I discussed labs with the patient today.  2. Pre-diabetes [name] Park has been on metformin TID, and she still notes polyphagia. Her fasting insulin is very elevated. I discussed labs with the patient today.  3. At risk for diabetes mellitus [name] Park is at higher than average risk for developing diabetes due to her obesity.   Assessment/Plan:   1. Vitamin D deficiency Low Vitamin D level contributes to fatigue and are associated with obesity, breast, and colon cancer. [name] Park agreed to start prescription Vitamin D 50,000 IU every week with no refills. She will follow-up for routine testing of Vitamin D, at least 2-3 times per year to avoid over-replacement.  - Vitamin D, Ergocalciferol, (DRISDOL) 1.25 MG (50000 UNIT) CAPS capsule; Take 1 capsule (50,000 Units total) by mouth every 7 (seven) days.  Dispense: 4 capsule; Refill: 0  2. Pre-diabetes [name] Park will continue to work on weight loss, exercise, and decreasing simple carbohydrates to help decrease the risk of diabetes. Surya will continue metformin TID and add Saxenda, and we will follow up at her next visit.  3. At risk for  diabetes mellitus [name] Park was given approximately 30 minutes of diabetes education and counseling today. We discussed intensive lifestyle modifications today with an emphasis on weight loss as well as increasing exercise and decreasing simple carbohydrates in her diet. We also reviewed medication options with an emphasis on risk versus benefit of those discussed.   Repetitive spaced learning was employed today to elicit superior memory formation and behavioral change.  4. Class 1 obesity with serious comorbidity and body mass index (BMI) of 32.0 to 32.9 in adult, unspecified obesity type [name] Park is currently in the action stage of change. As such, her goal is to continue with weight loss efforts. She has agreed to keeping a food journal and adhering to recommended goals of 1300 calories and 75+ grams of protein daily.   We discussed various medication options to help [name] Park with her weight loss efforts and we both agreed to start Saxenda 3.0 mg q daily with no refills (she is to start at 0.6 mg), and nano needles #100 with no refills.  - Liraglutide -Weight Management (SAXENDA) 18 MG/3ML SOPN; Inject 0.5 mLs (3 mg total) into the skin daily.  Dispense: 15 mL; Refill: 0 - Insulin Pen Needle (BD PEN NEEDLE NANO 2ND GEN) 32G X 4 MM MISC; 1 each by Does not apply route daily.  Dispense: 100 each; Refill: 0  Behavioral modification strategies: increasing lean protein intake and keeping a strict food journal.  [name] Park has agreed to follow-up with our clinic in 2 weeks. She was informed of the importance of  frequent follow-up visits to maximize her success with intensive lifestyle modifications for her multiple health conditions.   Objective:   Blood pressure (!) 177/82, pulse 94, temperature 97.7 F (36.5 C), temperature source Oral, height 5\' 2"  (1.575 m), weight 176 lb (79.8 kg), SpO2 96 %. Body mass index is 32.19 kg/m.  General: Cooperative, alert, well developed, in no acute distress. HEENT:  Conjunctivae and lids unremarkable. Cardiovascular: Regular rhythm.  Lungs: Normal work of breathing. Neurologic: No focal deficits.   Lab Results  Component Value Date   CREATININE 0.69 10/02/2019   BUN 13 10/02/2019   NA 136 10/02/2019   K 4.4 10/02/2019   CL 98 10/02/2019   CO2 23 10/02/2019   Lab Results  Component Value Date   ALT 29 10/02/2019   AST 40 10/02/2019   ALKPHOS 87 10/02/2019   BILITOT <0.2 10/02/2019   Lab Results  Component Value Date   HGBA1C 6.0 (H) 10/02/2019   Lab Results  Component Value Date   INSULIN 69.5 (H) 10/02/2019   Lab Results  Component Value Date   TSH 1.510 10/02/2019   Lab Results  Component Value Date   CHOL 193 10/02/2019   HDL 41 10/02/2019   LDLCALC 87 10/02/2019   TRIG 400 (H) 10/02/2019   Lab Results  Component Value Date   WBC 8.1 10/02/2019   HGB 13.3 10/02/2019   HCT 39.1 10/02/2019   MCV 89 10/02/2019   PLT 306 10/02/2019   No results found for: IRON, TIBC, FERRITIN  Attestation Statements:   Reviewed by clinician on day of visit: allergies, medications, problem list, medical history, surgical history, family history, social history, and previous encounter notes.   I, 10/04/2019, am acting as transcriptionist for Burt Knack, MD.  I have reviewed the above documentation for accuracy and completeness, and I agree with the above. -  Quillian Quince, MD

## 2019-10-23 DIAGNOSIS — F411 Generalized anxiety disorder: Secondary | ICD-10-CM | POA: Diagnosis not present

## 2019-10-29 DIAGNOSIS — J3081 Allergic rhinitis due to animal (cat) (dog) hair and dander: Secondary | ICD-10-CM | POA: Diagnosis not present

## 2019-10-29 DIAGNOSIS — J4520 Mild intermittent asthma, uncomplicated: Secondary | ICD-10-CM | POA: Diagnosis not present

## 2019-10-29 DIAGNOSIS — J3089 Other allergic rhinitis: Secondary | ICD-10-CM | POA: Diagnosis not present

## 2019-10-29 DIAGNOSIS — J301 Allergic rhinitis due to pollen: Secondary | ICD-10-CM | POA: Diagnosis not present

## 2019-11-04 ENCOUNTER — Encounter (INDEPENDENT_AMBULATORY_CARE_PROVIDER_SITE_OTHER): Payer: Self-pay | Admitting: Family Medicine

## 2019-11-04 ENCOUNTER — Ambulatory Visit (INDEPENDENT_AMBULATORY_CARE_PROVIDER_SITE_OTHER): Payer: BC Managed Care – PPO | Admitting: Family Medicine

## 2019-11-04 ENCOUNTER — Other Ambulatory Visit: Payer: Self-pay

## 2019-11-04 VITALS — BP 104/71 | HR 107 | Temp 98.1°F | Ht 62.0 in | Wt 171.0 lb

## 2019-11-04 DIAGNOSIS — Z9189 Other specified personal risk factors, not elsewhere classified: Secondary | ICD-10-CM

## 2019-11-04 DIAGNOSIS — E559 Vitamin D deficiency, unspecified: Secondary | ICD-10-CM

## 2019-11-04 DIAGNOSIS — E669 Obesity, unspecified: Secondary | ICD-10-CM | POA: Diagnosis not present

## 2019-11-04 DIAGNOSIS — R7303 Prediabetes: Secondary | ICD-10-CM | POA: Diagnosis not present

## 2019-11-04 DIAGNOSIS — Z6831 Body mass index (BMI) 31.0-31.9, adult: Secondary | ICD-10-CM

## 2019-11-04 MED ORDER — SAXENDA 18 MG/3ML ~~LOC~~ SOPN: 3.0000 mg | PEN_INJECTOR | Freq: Every day | SUBCUTANEOUS | 0 refills | Status: DC

## 2019-11-05 NOTE — Progress Notes (Signed)
Chief Complaint:   OBESITY [name] Park is here to discuss her progress with her obesity treatment plan along with follow-up of her obesity related diagnoses. [name] Park is on keeping a food journal and adhering to recommended goals of 1300 calories and 75+ grams of protein daily and states she is following her eating plan approximately 50-75% of the time. [name] Park states she is doing 0 minutes 0 times per week.  Today's visit was #: 3 Starting weight: 177 lbs Starting date: 10/02/2019 Today's weight: 171 lbs Today's date: 11/04/2019 Total lbs lost to date: 6 Total lbs lost since last in-office visit: 5  Interim History: [name] Park has been journaling the last few weeks. She is still trying to figure out what works and what doesn't work. She is trying to find soda type beverages that don't contain aspartame. She has gone over calories during the weeks.  Subjective:   1. Pre-diabetes [name] Park's last A1c was 6.0 and insulin 69.5, not at goal. She is on metformin daily.  2. Vitamin D deficiency [name] Park denies nausea, vomiting, or muscle weakness, but she notes fatigue. Last Vit D level was 27.7.  3. At risk for diabetes mellitus [name] Park is at higher than average risk for developing diabetes due to her obesity.   Assessment/Plan:   1. Pre-diabetes [name] Park will continue to work on weight loss, exercise, and decreasing simple carbohydrates to help decrease the risk of diabetes. We will repeat labs in 3 months.  2. Vitamin D deficiency Low Vitamin D level contributes to fatigue and are associated with obesity, breast, and colon cancer. [name] Park agreed to continue taking prescription Vitamin D 50,000 IU every week and will follow-up for routine testing of Vitamin D, at least 2-3 times per year to avoid over-replacement.  3. At risk for diabetes mellitus [name] Park was given approximately 10 minutes of diabetes education and counseling today. We discussed intensive lifestyle modifications today with an emphasis  on weight loss as well as increasing exercise and decreasing simple carbohydrates in her diet. We also reviewed medication options with an emphasis on risk versus benefit of those discussed.   Repetitive spaced learning was employed today to elicit superior memory formation and behavioral change.  4. Class 1 obesity with serious comorbidity and body mass index (BMI) of 31.0 to 31.9 in adult, unspecified obesity type [name] Park is currently in the action stage of change. As such, her goal is to continue with weight loss efforts. She has agreed to keeping a food journal and adhering to recommended goals of 1100-1300 calories and 75+ grams of protein daily.   We discussed various medication options to help [name] Park with her weight loss efforts and we both agreed to continue Saxenda 3 mg SubQ daily and we will refill for 1 month (she is to stay at 0.6 mg dose).  - Liraglutide -Weight Management (SAXENDA) 18 MG/3ML SOPN; Inject 0.5 mLs (3 mg total) into the skin daily.  Dispense: 15 mL; Refill: 0  Exercise goals: No exercise has been prescribed at this time.  Behavioral modification strategies: increasing lean protein intake, increasing vegetables, meal planning and cooking strategies, keeping healthy foods in the home and planning for success.  [name] Park has agreed to follow-up with our clinic in 2 weeks. She was informed of the importance of frequent follow-up visits to maximize her success with intensive lifestyle modifications for her multiple health conditions.   Objective:   Blood pressure 104/71, pulse (!) 107, temperature 98.1 F (36.7 C), temperature source Oral, height 5\' 2"  (1.575  m), weight 171 lb (77.6 kg), SpO2 96 %. Body mass index is 31.28 kg/m.  General: Cooperative, alert, well developed, in no acute distress. HEENT: Conjunctivae and lids unremarkable. Cardiovascular: Regular rhythm.  Lungs: Normal work of breathing. Neurologic: No focal deficits.   Lab Results  Component Value  Date   CREATININE 0.69 10/02/2019   BUN 13 10/02/2019   NA 136 10/02/2019   K 4.4 10/02/2019   CL 98 10/02/2019   CO2 23 10/02/2019   Lab Results  Component Value Date   ALT 29 10/02/2019   AST 40 10/02/2019   ALKPHOS 87 10/02/2019   BILITOT <0.2 10/02/2019   Lab Results  Component Value Date   HGBA1C 6.0 (H) 10/02/2019   Lab Results  Component Value Date   INSULIN 69.5 (H) 10/02/2019   Lab Results  Component Value Date   TSH 1.510 10/02/2019   Lab Results  Component Value Date   CHOL 193 10/02/2019   HDL 41 10/02/2019   LDLCALC 87 10/02/2019   TRIG 400 (H) 10/02/2019   Lab Results  Component Value Date   WBC 8.1 10/02/2019   HGB 13.3 10/02/2019   HCT 39.1 10/02/2019   MCV 89 10/02/2019   PLT 306 10/02/2019   No results found for: IRON, TIBC, FERRITIN  Attestation Statements:   Reviewed by clinician on day of visit: allergies, medications, problem list, medical history, surgical history, family history, social history, and previous encounter notes.   I, Burt Knack, am acting as transcriptionist for Reuben Likes, MD.  I have reviewed the above documentation for accuracy and completeness, and I agree with the above. - [name] Park Mires, MD

## 2019-11-17 DIAGNOSIS — E282 Polycystic ovarian syndrome: Secondary | ICD-10-CM | POA: Diagnosis not present

## 2019-11-17 DIAGNOSIS — E039 Hypothyroidism, unspecified: Secondary | ICD-10-CM | POA: Diagnosis not present

## 2019-11-17 DIAGNOSIS — R7301 Impaired fasting glucose: Secondary | ICD-10-CM | POA: Diagnosis not present

## 2019-11-17 DIAGNOSIS — E669 Obesity, unspecified: Secondary | ICD-10-CM | POA: Diagnosis not present

## 2019-11-19 ENCOUNTER — Other Ambulatory Visit (INDEPENDENT_AMBULATORY_CARE_PROVIDER_SITE_OTHER): Payer: Self-pay | Admitting: Family Medicine

## 2019-11-19 DIAGNOSIS — J301 Allergic rhinitis due to pollen: Secondary | ICD-10-CM | POA: Diagnosis not present

## 2019-11-19 DIAGNOSIS — J3081 Allergic rhinitis due to animal (cat) (dog) hair and dander: Secondary | ICD-10-CM | POA: Diagnosis not present

## 2019-11-19 DIAGNOSIS — E559 Vitamin D deficiency, unspecified: Secondary | ICD-10-CM

## 2019-11-19 DIAGNOSIS — J3089 Other allergic rhinitis: Secondary | ICD-10-CM | POA: Diagnosis not present

## 2019-11-20 ENCOUNTER — Ambulatory Visit (INDEPENDENT_AMBULATORY_CARE_PROVIDER_SITE_OTHER): Payer: BC Managed Care – PPO | Admitting: Family Medicine

## 2019-11-20 ENCOUNTER — Encounter (INDEPENDENT_AMBULATORY_CARE_PROVIDER_SITE_OTHER): Payer: Self-pay | Admitting: Family Medicine

## 2019-11-20 ENCOUNTER — Other Ambulatory Visit: Payer: Self-pay

## 2019-11-20 VITALS — BP 114/65 | HR 111 | Temp 98.1°F | Ht 62.0 in | Wt 169.0 lb

## 2019-11-20 DIAGNOSIS — E669 Obesity, unspecified: Secondary | ICD-10-CM

## 2019-11-20 DIAGNOSIS — Z9189 Other specified personal risk factors, not elsewhere classified: Secondary | ICD-10-CM

## 2019-11-20 DIAGNOSIS — Z6831 Body mass index (BMI) 31.0-31.9, adult: Secondary | ICD-10-CM | POA: Diagnosis not present

## 2019-11-20 DIAGNOSIS — E559 Vitamin D deficiency, unspecified: Secondary | ICD-10-CM

## 2019-11-20 DIAGNOSIS — E282 Polycystic ovarian syndrome: Secondary | ICD-10-CM | POA: Diagnosis not present

## 2019-11-20 DIAGNOSIS — F411 Generalized anxiety disorder: Secondary | ICD-10-CM | POA: Diagnosis not present

## 2019-11-20 MED ORDER — VITAMIN D (ERGOCALCIFEROL) 1.25 MG (50000 UNIT) PO CAPS: 50000.0000 [IU] | ORAL_CAPSULE | ORAL | 0 refills | Status: DC

## 2019-11-20 MED ORDER — SAXENDA 18 MG/3ML ~~LOC~~ SOPN: 1.2000 mg | PEN_INJECTOR | Freq: Every day | SUBCUTANEOUS | 0 refills | Status: DC

## 2019-11-24 NOTE — Progress Notes (Signed)
Chief Complaint:   OBESITY [name] Park is here to discuss her progress with her obesity treatment plan along with follow-up of her obesity related diagnoses. [name] Park is on keeping a food journal and adhering to recommended goals of 1100-1300 calories and 75+ grams of protein daily and states she is following her eating plan approximately 60-70% of the time. 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  Today's visit was #: 4 Starting weight: 177 lbs Starting date: 10/02/2019 Today's weight: 169 lbs Today's date: 11/20/2019 Total lbs lost to date: 8 Total lbs lost since last in-office visit: 2  Interim History: [name] Park voices the last few weeks has been pretty good. She is experiencing a decrease in BM frequency. She is doing well on Saxenda (still on 0.6 mg). Calorie wise she is averaging 1500 calories occasionally more. She is actually journaling hitting protein goal daily.  Subjective:   1. Vitamin D deficiency [name] Park denies nausea, vomiting, or muscle weakness, but she notes fatigue. She is on prescription Vit D.  2. PCOS (polycystic ovarian syndrome) [name] Park recently increased metformin to 4 tablets daily, and she denies GI side effects. She sees Dr. Romero Belling.  3. At risk for nausea [name] Park is at risk for nausea with increase in Saxenda.  Assessment/Plan:   1. Vitamin D deficiency Low Vitamin D level contributes to fatigue and are associated with obesity, breast, and colon cancer. We will refill prescription Vitamin D for 1 month. [name] Park will follow-up for routine testing of Vitamin D, at least 2-3 times per year to avoid over-replacement.  - Vitamin D, Ergocalciferol, (DRISDOL) 1.25 MG (50000 UNIT) CAPS capsule; Take 1 capsule (50,000 Units total) by mouth every 7 (seven) days.  Dispense: 4 capsule; Refill: 0  2. PCOS (polycystic ovarian syndrome) Intensive lifestyle modifications are first line treatment for this issue. We discussed several lifestyle modifications today.  [name] Park will continue to work on diet, exercise and weight loss efforts. She is to follow up with Dr. Romero Belling for further management. Orders and follow up as documented in patient record.  Counseling  PCOS is a leading cause of menstrual irregularities and infertility. It is also associated with obesity, hirsutism (excessive hair growth on the face, chest, or back), and cardiovascular risk factors such as high cholesterol and insulin resistance.  Insulin resistance appears to play a central role.   Women with PCOS have been shown to have impaired appetite-regulating hormones.  Metformin is one medication that can improve metabolic parameters.   Women with polycystic ovary syndrome (PCOS) have an increased risk for cardiovascular disease (CVD) - European Journal of Preventive Cardiology.  3. At risk for nausea [name] Park was given approximately 15 minutes of nausea prevention counseling today. [name] Park is at risk for nausea due to her new or current medication. She was encouraged to titrate her medication slowly, make sure to stay hydrated, eat smaller portions throughout the day, and avoid high fat meals.   4. Class 1 obesity with serious comorbidity and body mass index (BMI) of 31.0 to 31.9 in adult, unspecified obesity type [name] Park is currently in the action stage of change. As such, her goal is to continue with weight loss efforts. She has agreed to keeping a food journal and adhering to recommended goals of 1300 calories and 75+ grams of protein daily.   We discussed various medication options to help [name] Park with her weight loss efforts and we both agreed to increase Saxenda to 0.9 mg SubQ daily with no refills.  -  Liraglutide -Weight Management (SAXENDA) 18 MG/3ML SOPN; Inject 1.2 mg into the skin daily.  Dispense: 15 mL; Refill: 0  Exercise goals: As is.  Behavioral modification strategies: increasing lean protein intake, meal planning and cooking strategies, keeping healthy foods in  the home and planning for success.  [name] Park has agreed to follow-up with our clinic in 2 weeks. She was informed of the importance of frequent follow-up visits to maximize her success with intensive lifestyle modifications for her multiple health conditions.   Objective:   Blood pressure 114/65, pulse (!) 111, temperature 98.1 F (36.7 C), temperature source Oral, height 5\' 2"  (1.575 m), weight 169 lb (76.7 kg), SpO2 97 %. Body mass index is 30.91 kg/m.  General: Cooperative, alert, well developed, in no acute distress. HEENT: Conjunctivae and lids unremarkable. Cardiovascular: Regular rhythm.  Lungs: Normal work of breathing. Neurologic: No focal deficits.   Lab Results  Component Value Date   CREATININE 0.69 10/02/2019   BUN 13 10/02/2019   NA 136 10/02/2019   K 4.4 10/02/2019   CL 98 10/02/2019   CO2 23 10/02/2019   Lab Results  Component Value Date   ALT 29 10/02/2019   AST 40 10/02/2019   ALKPHOS 87 10/02/2019   BILITOT <0.2 10/02/2019   Lab Results  Component Value Date   HGBA1C 6.0 (H) 10/02/2019   Lab Results  Component Value Date   INSULIN 69.5 (H) 10/02/2019   Lab Results  Component Value Date   TSH 1.510 10/02/2019   Lab Results  Component Value Date   CHOL 193 10/02/2019   HDL 41 10/02/2019   LDLCALC 87 10/02/2019   TRIG 400 (H) 10/02/2019   Lab Results  Component Value Date   WBC 8.1 10/02/2019   HGB 13.3 10/02/2019   HCT 39.1 10/02/2019   MCV 89 10/02/2019   PLT 306 10/02/2019   No results found for: IRON, TIBC, FERRITIN  Attestation Statements:   Reviewed by clinician on day of visit: allergies, medications, problem list, medical history, surgical history, family history, social history, and previous encounter notes.   I, 10/04/2019, am acting as transcriptionist for Burt Knack, MD.  I have reviewed the above documentation for accuracy and completeness, and I agree with the above. - Reuben Likes, MD

## 2019-11-25 DIAGNOSIS — M542 Cervicalgia: Secondary | ICD-10-CM | POA: Diagnosis not present

## 2019-11-25 DIAGNOSIS — G43719 Chronic migraine without aura, intractable, without status migrainosus: Secondary | ICD-10-CM | POA: Diagnosis not present

## 2019-12-04 DIAGNOSIS — F3189 Other bipolar disorder: Secondary | ICD-10-CM | POA: Diagnosis not present

## 2019-12-09 ENCOUNTER — Ambulatory Visit (INDEPENDENT_AMBULATORY_CARE_PROVIDER_SITE_OTHER): Payer: BC Managed Care – PPO | Admitting: Family Medicine

## 2019-12-09 ENCOUNTER — Other Ambulatory Visit: Payer: Self-pay

## 2019-12-09 ENCOUNTER — Encounter (INDEPENDENT_AMBULATORY_CARE_PROVIDER_SITE_OTHER): Payer: Self-pay | Admitting: Family Medicine

## 2019-12-09 VITALS — BP 127/76 | HR 101 | Temp 98.2°F | Ht 62.0 in | Wt 165.0 lb

## 2019-12-09 DIAGNOSIS — E559 Vitamin D deficiency, unspecified: Secondary | ICD-10-CM

## 2019-12-09 DIAGNOSIS — Z6830 Body mass index (BMI) 30.0-30.9, adult: Secondary | ICD-10-CM | POA: Diagnosis not present

## 2019-12-09 DIAGNOSIS — R7303 Prediabetes: Secondary | ICD-10-CM

## 2019-12-09 DIAGNOSIS — Z9189 Other specified personal risk factors, not elsewhere classified: Secondary | ICD-10-CM

## 2019-12-09 DIAGNOSIS — E669 Obesity, unspecified: Secondary | ICD-10-CM | POA: Diagnosis not present

## 2019-12-09 MED ORDER — VITAMIN D (ERGOCALCIFEROL) 1.25 MG (50000 UNIT) PO CAPS: 50000.0000 [IU] | ORAL_CAPSULE | ORAL | 0 refills | Status: DC

## 2019-12-09 NOTE — Progress Notes (Signed)
Chief Complaint:   OBESITY [name] Park is here to discuss her progress with her obesity treatment plan along with follow-up of her obesity related diagnoses. [name] Park is on keeping a food journal and adhering to recommended goals of 1300 calories and 75+ grams of protein daily and states she is following her eating plan approximately 100% of the time. [name] Park states she is doing 0 minutes 0 times per week.  Today's visit was #: 5 Starting weight: 177 lbs Starting date: 10/02/2019 Today's weight: 165 lbs Today's date: 12/09/2019 Total lbs lost to date: 12 Total lbs lost since last in-office visit: 4  Interim History: [name] Park feels that she may have gotten better at journaling. She has paid attention to protein intake and has disregarded the alerts for saturated fats. She had a few days of going over calories. She is getting hungry around 1230-130 pm. Otherwise she feels satisfied.  Subjective:   1. Vitamin D deficiency [name] Park denies nausea, vomiting, or muscle weakness, but she notes fatigue. Last Vit D level was 27.7.  2. Pre-diabetes [name] Park's last A1c was 6.0 and insulin 69.5. She is on Saxenda and metformin.  3. At risk for nausea [name] Park is at risk for nausea due to [name] Park usage.  Assessment/Plan:   1. Vitamin D deficiency Low Vitamin D level contributes to fatigue and are associated with obesity, breast, and colon cancer. We will refill prescription Vitamin D for 1 month. [name] Park will follow-up for routine testing of Vitamin D, at least 2-3 times per year to avoid over-replacement.  - Vitamin D, Ergocalciferol, (DRISDOL) 1.25 MG (50000 UNIT) CAPS capsule; Take 1 capsule (50,000 Units total) by mouth every 7 (seven) days.  Dispense: 4 capsule; Refill: 0  2. Pre-diabetes [name] Park will continue metformin, no refill needed and will continue to work on weight loss, exercise, and decreasing simple carbohydrates to help decrease the risk of diabetes.   3. At risk for nausea [name] Park was given approximately 15 minutes of nausea prevention counseling today. [name] Park is at risk for nausea due to her new or current medication. She was encouraged to titrate her medication slowly, make sure to stay hydrated, eat smaller portions throughout the day, and avoid high fat meals.   4. Class 1 obesity with serious comorbidity and body mass index (BMI) of 30.0 to 30.9 in adult, unspecified obesity type [name] Park is currently in the action stage of change. As such, her goal is to continue with weight loss efforts. She has agreed to keeping a food journal and adhering to recommended goals of 1300 calories and 75+ grams of protein daily.   We discussed various medication options to help [name] Park with her weight loss efforts and we both agreed to continue Saxenda at her current dose (0.9 mg).  Exercise goals: All adults should avoid inactivity. Some physical activity is better than none, and adults who participate in any amount of physical activity gain some health benefits.  Behavioral modification strategies: increasing lean protein intake, increasing vegetables, meal planning and cooking strategies, planning for success and keeping a strict food journal.  [name] Park has agreed to follow-up with our clinic in 2 weeks. She was informed of the importance of frequent follow-up visits to maximize her success with intensive lifestyle modifications for her multiple health conditions.   Objective:   Blood pressure 127/76, pulse (!) 101, temperature 98.2 F (36.8 C), height 5\' 2"  (1.575 m), weight 165 lb (74.8 kg), SpO2 98 %. Body mass index is 30.18 kg/m.  General:  Cooperative, alert, well developed, in no acute distress. HEENT: Conjunctivae and lids unremarkable. Cardiovascular: Regular rhythm.  Lungs: Normal work of breathing. Neurologic: No focal deficits.   Lab Results  Component Value Date   CREATININE 0.69 10/02/2019   BUN 13 10/02/2019   NA 136 10/02/2019   K 4.4 10/02/2019   CL  98 10/02/2019   CO2 23 10/02/2019   Lab Results  Component Value Date   ALT 29 10/02/2019   AST 40 10/02/2019   ALKPHOS 87 10/02/2019   BILITOT <0.2 10/02/2019   Lab Results  Component Value Date   HGBA1C 6.0 (H) 10/02/2019   Lab Results  Component Value Date   INSULIN 69.5 (H) 10/02/2019   Lab Results  Component Value Date   TSH 1.510 10/02/2019   Lab Results  Component Value Date   CHOL 193 10/02/2019   HDL 41 10/02/2019   LDLCALC 87 10/02/2019   TRIG 400 (H) 10/02/2019   Lab Results  Component Value Date   WBC 8.1 10/02/2019   HGB 13.3 10/02/2019   HCT 39.1 10/02/2019   MCV 89 10/02/2019   PLT 306 10/02/2019   No results found for: IRON, TIBC, FERRITIN  Attestation Statements:   Reviewed by clinician on day of visit: allergies, medications, problem list, medical history, surgical history, family history, social history, and previous encounter notes.   I, Burt Knack, am acting as transcriptionist for Reuben Likes, MD.  I have reviewed the above documentation for accuracy and completeness, and I agree with the above. - Katherina Mires, MD

## 2019-12-10 DIAGNOSIS — J3081 Allergic rhinitis due to animal (cat) (dog) hair and dander: Secondary | ICD-10-CM | POA: Diagnosis not present

## 2019-12-10 DIAGNOSIS — J301 Allergic rhinitis due to pollen: Secondary | ICD-10-CM | POA: Diagnosis not present

## 2019-12-10 DIAGNOSIS — J3089 Other allergic rhinitis: Secondary | ICD-10-CM | POA: Diagnosis not present

## 2019-12-16 DIAGNOSIS — J3081 Allergic rhinitis due to animal (cat) (dog) hair and dander: Secondary | ICD-10-CM | POA: Diagnosis not present

## 2019-12-16 DIAGNOSIS — J301 Allergic rhinitis due to pollen: Secondary | ICD-10-CM | POA: Diagnosis not present

## 2019-12-16 DIAGNOSIS — J3089 Other allergic rhinitis: Secondary | ICD-10-CM | POA: Diagnosis not present

## 2019-12-16 DIAGNOSIS — Z23 Encounter for immunization: Secondary | ICD-10-CM | POA: Diagnosis not present

## 2019-12-18 DIAGNOSIS — F411 Generalized anxiety disorder: Secondary | ICD-10-CM | POA: Diagnosis not present

## 2019-12-23 DIAGNOSIS — J301 Allergic rhinitis due to pollen: Secondary | ICD-10-CM | POA: Diagnosis not present

## 2019-12-23 DIAGNOSIS — J3081 Allergic rhinitis due to animal (cat) (dog) hair and dander: Secondary | ICD-10-CM | POA: Diagnosis not present

## 2019-12-23 DIAGNOSIS — J3089 Other allergic rhinitis: Secondary | ICD-10-CM | POA: Diagnosis not present

## 2019-12-25 ENCOUNTER — Encounter (INDEPENDENT_AMBULATORY_CARE_PROVIDER_SITE_OTHER): Payer: Self-pay | Admitting: Adult Health

## 2019-12-25 ENCOUNTER — Other Ambulatory Visit: Payer: Self-pay

## 2019-12-25 ENCOUNTER — Ambulatory Visit (INDEPENDENT_AMBULATORY_CARE_PROVIDER_SITE_OTHER): Payer: BC Managed Care – PPO | Admitting: Adult Health

## 2019-12-25 VITALS — BP 111/75 | HR 105 | Temp 97.8°F | Ht 62.0 in | Wt 167.0 lb

## 2019-12-25 DIAGNOSIS — E669 Obesity, unspecified: Secondary | ICD-10-CM | POA: Diagnosis not present

## 2019-12-25 DIAGNOSIS — R7303 Prediabetes: Secondary | ICD-10-CM

## 2019-12-25 DIAGNOSIS — Z6830 Body mass index (BMI) 30.0-30.9, adult: Secondary | ICD-10-CM

## 2019-12-25 DIAGNOSIS — E559 Vitamin D deficiency, unspecified: Secondary | ICD-10-CM | POA: Diagnosis not present

## 2019-12-25 DIAGNOSIS — E282 Polycystic ovarian syndrome: Secondary | ICD-10-CM

## 2019-12-29 DIAGNOSIS — E282 Polycystic ovarian syndrome: Secondary | ICD-10-CM | POA: Insufficient documentation

## 2019-12-29 DIAGNOSIS — R7303 Prediabetes: Secondary | ICD-10-CM | POA: Insufficient documentation

## 2019-12-29 DIAGNOSIS — E559 Vitamin D deficiency, unspecified: Secondary | ICD-10-CM | POA: Insufficient documentation

## 2019-12-29 NOTE — Progress Notes (Signed)
Chief Complaint:   OBESITY [name] Park is here to discuss her progress with her obesity treatment plan along with follow-up of her obesity related diagnoses. 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 [name] Park states she is exercising 0 minutes 0 times per week.  Today's visit was #: 6 Starting weight: 177 lbs Starting date: 10/02/2019 Today's weight: 167 lbs Today's date: 12/25/2019 Total lbs lost to date: 10 Total lbs lost since last in-office visit: 0  Interim History: [name] Park will track intake throughout the day.  She estimates to hit her calorie and protein goals, and track greater than 90% of the time. She has tolerated increased dose of Saxenda to 0.9 mg daily. She denies mass in neck, dysphagia, dyspepsia, or persistent hoarseness. She denies history of pancreatitis. She denies family history of MTC.  Subjective:   Pre-diabetes. Marguetta has a diagnosis of prediabetes based on her elevated HgA1c and was informed this puts her at greater risk of developing diabetes. She continues to work on diet and exercise to decrease her risk of diabetes. She denies nausea or hypoglycemia. 10/02/2019 A1c 6.0 with an insulin level of 69.5. CMP showed GFR greater than 60.  Lab Results  Component Value Date   HGBA1C 6.0 (H) 10/02/2019   Lab Results  Component Value Date   INSULIN 69.5 (H) 10/02/2019   PCOS (polycystic ovarian syndrome). 10/02/2019 A1c 6.0. [name] Park is on metformin 500 mg 4 tabs daily - was recently increased from 3 tabs daily approximately 4 weeks ago by Dr. Talmage Nap of Endocrinology.  Vitamin D deficiency. Vitamin D level was 27.7 on 10/02/2019, below goal of 50. 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 No nausea, vomiting, or muscle weakness.    Ref. Range 10/02/2019 14:10  Vitamin D, 25-Hydroxy Latest Ref Range: 30.0 - 100.0 ng/mL 27.7 (L)     Assessment/Plan:   Pre-diabetes. [name] will continue to work on weight loss, exercise, and decreasing simple carbohydrates to help decrease the risk of diabetes. She will continue metformin and GLP-1 as directed.   PCOS (polycystic ovarian syndrome). [name] Park will continue metformin 500 mg 2 tabs at breakfast and 2 tabs at dinner. She will follow-up with Dr. Talmage Nap in December 2021 as scheduled.  Vitamin D deficiency. Low Vitamin D level contributes to fatigue and are associated with obesity, breast, and colon cancer. She agrees to continue to take prescription Ergocalciferol as directed (no medication refill today) and will follow-up for routine testing of Vitamin D, at least 2-3 times per year to avoid over-replacement.  Class 1 obesity with serious comorbidity and body mass index (BMI) of 30.0 to 30.9 in adult, unspecified obesity type. [name] Park will increase Saxenda to 1.2 mg daily (no medication refill today).  [name] Park is currently in the action stage of change. As such, her goal is to continue with weight loss efforts. She has agreed to keeping a food journal and adhering to recommended goals of 1300 calories and 75 grams of protein daily.   Exercise goals: [name] Park will exercise 15 minutes 3 times per week - 7 minutes light cardio and 7 minutes light weights.  Behavioral modification strategies: increasing lean protein intake, no skipping meals, meal planning and cooking strategies, planning for success and keeping a strict food journal.  [name] Park has agreed to follow-up with our clinic in 2 weeks. She was informed of the importance of frequent  follow-up visits to maximize her success with intensive lifestyle modifications for her multiple health conditions.   Objective:   Blood pressure 111/75, pulse (!) 105, temperature 97.8 F (36.6 C), height 5\' 2"  (1.575 m), weight 167 lb (75.8 kg), SpO2 97 %. Body mass index is 30.54 kg/m.  General: Cooperative, alert, well developed, in no acute  distress. HEENT: Conjunctivae and lids unremarkable. Cardiovascular: Regular rhythm.  Lungs: Normal work of breathing. Neurologic: No focal deficits.   Lab Results  Component Value Date   CREATININE 0.69 10/02/2019   BUN 13 10/02/2019   NA 136 10/02/2019   K 4.4 10/02/2019   CL 98 10/02/2019   CO2 23 10/02/2019   Lab Results  Component Value Date   ALT 29 10/02/2019   AST 40 10/02/2019   ALKPHOS 87 10/02/2019   BILITOT <0.2 10/02/2019   Lab Results  Component Value Date   HGBA1C 6.0 (H) 10/02/2019   Lab Results  Component Value Date   INSULIN 69.5 (H) 10/02/2019   Lab Results  Component Value Date   TSH 1.510 10/02/2019   Lab Results  Component Value Date   CHOL 193 10/02/2019   HDL 41 10/02/2019   LDLCALC 87 10/02/2019   TRIG 400 (H) 10/02/2019   Lab Results  Component Value Date   WBC 8.1 10/02/2019   HGB 13.3 10/02/2019   HCT 39.1 10/02/2019   MCV 89 10/02/2019   PLT 306 10/02/2019   No results found for: IRON, TIBC, FERRITIN  Attestation Statements:   Reviewed by clinician on day of visit: allergies, medications, problem list, medical history, surgical history, family history, social history, and previous encounter notes.  Time spent on visit including pre-visit chart review and post-visit charting and care was 37 minutes.   I, 10/04/2019, am acting as [place] [employer] for Energy manager, NP-C   I have reviewed the above documentation for accuracy and completeness, and I agree with the above. -  Sholonda Jobst d. Chyla Schlender, NP-C

## 2019-12-30 DIAGNOSIS — F411 Generalized anxiety disorder: Secondary | ICD-10-CM | POA: Diagnosis not present

## 2020-01-07 DIAGNOSIS — J301 Allergic rhinitis due to pollen: Secondary | ICD-10-CM | POA: Diagnosis not present

## 2020-01-07 DIAGNOSIS — J3081 Allergic rhinitis due to animal (cat) (dog) hair and dander: Secondary | ICD-10-CM | POA: Diagnosis not present

## 2020-01-07 DIAGNOSIS — J3089 Other allergic rhinitis: Secondary | ICD-10-CM | POA: Diagnosis not present

## 2020-01-13 ENCOUNTER — Other Ambulatory Visit (INDEPENDENT_AMBULATORY_CARE_PROVIDER_SITE_OTHER): Payer: Self-pay | Admitting: Family Medicine

## 2020-01-13 DIAGNOSIS — E559 Vitamin D deficiency, unspecified: Secondary | ICD-10-CM

## 2020-01-13 DIAGNOSIS — J3089 Other allergic rhinitis: Secondary | ICD-10-CM | POA: Diagnosis not present

## 2020-01-13 DIAGNOSIS — J3081 Allergic rhinitis due to animal (cat) (dog) hair and dander: Secondary | ICD-10-CM | POA: Diagnosis not present

## 2020-01-13 DIAGNOSIS — G43719 Chronic migraine without aura, intractable, without status migrainosus: Secondary | ICD-10-CM | POA: Diagnosis not present

## 2020-01-13 DIAGNOSIS — J301 Allergic rhinitis due to pollen: Secondary | ICD-10-CM | POA: Diagnosis not present

## 2020-01-14 ENCOUNTER — Encounter (INDEPENDENT_AMBULATORY_CARE_PROVIDER_SITE_OTHER): Payer: Self-pay | Admitting: Adult Health

## 2020-01-14 ENCOUNTER — Ambulatory Visit (INDEPENDENT_AMBULATORY_CARE_PROVIDER_SITE_OTHER): Payer: BC Managed Care – PPO | Admitting: Adult Health

## 2020-01-14 ENCOUNTER — Other Ambulatory Visit: Payer: Self-pay

## 2020-01-14 VITALS — BP 102/68 | HR 102 | Temp 97.4°F | Ht 62.0 in | Wt 161.0 lb

## 2020-01-14 DIAGNOSIS — E669 Obesity, unspecified: Secondary | ICD-10-CM | POA: Diagnosis not present

## 2020-01-14 DIAGNOSIS — Z6830 Body mass index (BMI) 30.0-30.9, adult: Secondary | ICD-10-CM

## 2020-01-14 DIAGNOSIS — R7303 Prediabetes: Secondary | ICD-10-CM

## 2020-01-14 DIAGNOSIS — Z9189 Other specified personal risk factors, not elsewhere classified: Secondary | ICD-10-CM

## 2020-01-14 DIAGNOSIS — E781 Pure hyperglyceridemia: Secondary | ICD-10-CM | POA: Diagnosis not present

## 2020-01-14 DIAGNOSIS — E559 Vitamin D deficiency, unspecified: Secondary | ICD-10-CM

## 2020-01-14 MED ORDER — VITAMIN D (ERGOCALCIFEROL) 1.25 MG (50000 UNIT) PO CAPS: 50000.0000 [IU] | ORAL_CAPSULE | ORAL | 0 refills | Status: DC

## 2020-01-14 NOTE — Progress Notes (Signed)
Chief Complaint:   OBESITY [name] Park is here to discuss her progress with her obesity treatment plan along with follow-up of her obesity related diagnoses. 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 [name] Park states she is walking 30 minutes 2-3 times per week.  Today's visit was #: 7 Starting weight: 177 lbs Starting date: 10/02/2019 Today's weight: 161 lbs Today's date: 01/14/2020 Total lbs lost to date: 16 Total lbs lost since last in-office visit: 6  Interim History: [name] Park is trying to make the best food selections and is tracking intake 100%. She estimates to hit her protein goals 90-95% and calorie goals 75-80% of the time. She increased Saxenda to 1.2 mg approximately 2 weeks ago. She denies mass in neck, dysphagia, dyspepsia, or her voice is a little hoarse today- however she attributes that to seasonal allergies and pots nasal gtt.  Subjective:   Vitamin D deficiency. Vitamin D level on 10/02/2019 was 27.7, below goal of 50. 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 No nausea, vomiting, or muscle weakness.    Ref. Range 10/02/2019 14:10  Vitamin D, 25-Hydroxy Latest Ref Range: 30.0 - 100.0 ng/mL 27.7 (L)   Hypertriglyceridemia. Lipid panel on 10/02/2019 demonstrated quite elevated triglycerides at 400. [name] Park is not on any cholesterol lowering medication.  Prediabetes. [name] Park has a diagnosis of prediabetes based on her elevated HgA1c and was informed this puts her at greater risk of developing diabetes. She continues to work on diet and exercise to decrease her risk of diabetes. She denies nausea or hypoglycemia. 10/02/2019 blood glucose 95, A1c 6.0 with an insulin level of 69.5. [name] Park is on metformin and a GLP-1, which she is tolerating well.  Lab Results  Component Value Date   HGBA1C 6.0 (H) 10/02/2019   Lab Results  Component Value  Date   INSULIN 69.5 (H) 10/02/2019   At risk for diabetes mellitus. Triniti is at higher than average risk for developing diabetes due to prediabetes and obesity.   Assessment/Plan:   Vitamin D deficiency. Low Vitamin D level contributes to fatigue and are associated with obesity, breast, and colon cancer. She was given a refill on her Vitamin D, Ergocalciferol, (DRISDOL) 1.25 MG (50000 UNIT) CAPS capsule every week #4 with 0 refills and VITAMIN D 25 Hydroxy (Vit-D Deficiency, Fractures) level will be checked today.   Hypertriglyceridemia. Labs will be checked today.   Prediabetes. [name] Park will continue to work on weight loss, exercise, and decreasing simple carbohydrates to help decrease the risk of diabetes. Labs will be checked today.   At risk for diabetes mellitus. [name] Park was given approximately 15 minutes of diabetes education and counseling today. We discussed intensive lifestyle modifications today with an emphasis on weight loss as well as increasing exercise and decreasing simple carbohydrates in her diet. We also reviewed medication options with an emphasis on risk versus benefit of those discussed.   Repetitive spaced learning was employed today to elicit superior memory formation and behavioral change.  Class 1 obesity with serious comorbidity and body mass index (BMI) of 30.0 to 30.9 in adult, unspecified obesity type - BMI above 30 at start of program. [name] Park will continue Saxenda 1.2 mg daily, no medication refill today.  [name] Park is currently in the action stage of change. As such, her goal is to continue with weight loss efforts. She has agreed to keeping a food  journal and adhering to recommended goals of 1300 calories and 75 grams of protein.   Handout was provided on Recipes.  Exercise goals: [name] Park will continue walking 30 minutes 2-3 times per week.  Behavioral modification strategies: increasing lean protein intake, meal planning and cooking strategies, holiday eating  strategies , planning for success and keeping a strict food journal.  [name] Park has agreed to follow-up with our clinic in 2-3 weeks. She was informed of the importance of frequent follow-up visits to maximize her success with intensive lifestyle modifications for her multiple health conditions.   [name] Park was informed we would discuss her lab results at her next visit unless there is a critical issue that needs to be addressed sooner. [name] Park agreed to keep her next visit at the agreed upon time to discuss these results.  Objective:   Blood pressure 102/68, pulse (!) 102, temperature (!) 97.4 F (36.3 C), height 5\' 2"  (1.575 m), weight 161 lb (73 kg), SpO2 98 %. Body mass index is 29.45 kg/m.  General: Cooperative, alert, well developed, in no acute distress. HEENT: Conjunctivae and lids unremarkable. Cardiovascular: Regular rhythm.  Lungs: Normal work of breathing. Neurologic: No focal deficits.   Lab Results  Component Value Date   CREATININE 0.69 10/02/2019   BUN 13 10/02/2019   NA 136 10/02/2019   K 4.4 10/02/2019   CL 98 10/02/2019   CO2 23 10/02/2019   Lab Results  Component Value Date   ALT 29 10/02/2019   AST 40 10/02/2019   ALKPHOS 87 10/02/2019   BILITOT <0.2 10/02/2019   Lab Results  Component Value Date   HGBA1C 6.0 (H) 10/02/2019   Lab Results  Component Value Date   INSULIN 69.5 (H) 10/02/2019   Lab Results  Component Value Date   TSH 1.510 10/02/2019   Lab Results  Component Value Date   CHOL 193 10/02/2019   HDL 41 10/02/2019   LDLCALC 87 10/02/2019   TRIG 400 (H) 10/02/2019   Lab Results  Component Value Date   WBC 8.1 10/02/2019   HGB 13.3 10/02/2019   HCT 39.1 10/02/2019   MCV 89 10/02/2019   PLT 306 10/02/2019   No results found for: IRON, TIBC, FERRITIN  Attestation Statements:   Reviewed by clinician on day of visit: allergies, medications, problem list, medical history, surgical history, family history, social history, and previous  encounter notes.  I, 10/04/2019, am acting as [employer] for Energy manager, NP-C   I have reviewed the above documentation for accuracy and completeness, and I agree with the above. -  Fauna Neuner d. Kinzleigh Kandler, NP-C

## 2020-01-15 DIAGNOSIS — J320 Chronic maxillary sinusitis: Secondary | ICD-10-CM | POA: Diagnosis not present

## 2020-01-15 LAB — COMPREHENSIVE METABOLIC PANEL
ALT: 13 IU/L (ref 0–32)
AST: 17 IU/L (ref 0–40)
Albumin/Globulin Ratio: 1.8 (ref 1.2–2.2)
Albumin: 4.2 g/dL (ref 3.8–4.8)
Alkaline Phosphatase: 87 IU/L (ref 44–121)
BUN/Creatinine Ratio: 10 (ref 9–23)
BUN: 7 mg/dL (ref 6–24)
Bilirubin Total: <0.2 mg/dL (ref 0.0–1.2)
CO2: 21 mmol/L (ref 20–29)
Calcium: 9.5 mg/dL (ref 8.7–10.2)
Chloride: 103 mmol/L (ref 96–106)
Creatinine, Ser: 0.68 mg/dL (ref 0.57–1.00)
GFR calc Af Amer: 125 mL/min/{1.73_m2} (ref >59)
GFR calc non Af Amer: 108 mL/min/{1.73_m2} (ref >59)
Globulin, Total: 2.4 g/dL (ref 1.5–4.5)
Glucose: 91 mg/dL (ref 65–99)
Potassium: 4.7 mmol/L (ref 3.5–5.2)
Sodium: 140 mmol/L (ref 134–144)
Total Protein: 6.6 g/dL (ref 6.0–8.5)

## 2020-01-15 LAB — INSULIN, RANDOM: INSULIN: 28.3 u[IU]/mL — ABNORMAL HIGH (ref 2.6–24.9)

## 2020-01-15 LAB — VITAMIN D 25 HYDROXY (VIT D DEFICIENCY, FRACTURES): Vit D, 25-Hydroxy: 55.9 ng/mL (ref 30.0–100.0)

## 2020-01-15 LAB — LIPID PANEL
Chol/HDL Ratio: 4.6 ratio — ABNORMAL HIGH (ref 0.0–4.4)
Cholesterol, Total: 185 mg/dL (ref 100–199)
HDL: 40 mg/dL (ref >39)
LDL Chol Calc (NIH): 92 mg/dL (ref 0–99)
Triglycerides: 321 mg/dL — ABNORMAL HIGH (ref 0–149)
VLDL Cholesterol Cal: 53 mg/dL — ABNORMAL HIGH (ref 5–40)

## 2020-01-15 LAB — HEMOGLOBIN A1C
Est. average glucose Bld gHb Est-mCnc: 114 mg/dL
Hgb A1c MFr Bld: 5.6 % (ref 4.8–5.6)

## 2020-01-21 DIAGNOSIS — F411 Generalized anxiety disorder: Secondary | ICD-10-CM | POA: Diagnosis not present

## 2020-01-22 DIAGNOSIS — F1020 Alcohol dependence, uncomplicated: Secondary | ICD-10-CM | POA: Diagnosis not present

## 2020-01-22 DIAGNOSIS — F3189 Other bipolar disorder: Secondary | ICD-10-CM | POA: Diagnosis not present

## 2020-01-27 DIAGNOSIS — M542 Cervicalgia: Secondary | ICD-10-CM | POA: Diagnosis not present

## 2020-01-27 DIAGNOSIS — J3089 Other allergic rhinitis: Secondary | ICD-10-CM | POA: Diagnosis not present

## 2020-01-27 DIAGNOSIS — G43719 Chronic migraine without aura, intractable, without status migrainosus: Secondary | ICD-10-CM | POA: Diagnosis not present

## 2020-01-27 DIAGNOSIS — J3081 Allergic rhinitis due to animal (cat) (dog) hair and dander: Secondary | ICD-10-CM | POA: Diagnosis not present

## 2020-01-27 DIAGNOSIS — J301 Allergic rhinitis due to pollen: Secondary | ICD-10-CM | POA: Diagnosis not present

## 2020-02-03 ENCOUNTER — Ambulatory Visit (INDEPENDENT_AMBULATORY_CARE_PROVIDER_SITE_OTHER): Payer: BC Managed Care – PPO | Admitting: Adult Health

## 2020-02-03 ENCOUNTER — Encounter (INDEPENDENT_AMBULATORY_CARE_PROVIDER_SITE_OTHER): Payer: Self-pay | Admitting: Adult Health

## 2020-02-03 ENCOUNTER — Other Ambulatory Visit: Payer: Self-pay

## 2020-02-03 VITALS — BP 113/72 | HR 108 | Temp 98.0°F | Ht 62.0 in | Wt 159.0 lb

## 2020-02-03 DIAGNOSIS — Z9189 Other specified personal risk factors, not elsewhere classified: Secondary | ICD-10-CM | POA: Diagnosis not present

## 2020-02-03 DIAGNOSIS — Z6830 Body mass index (BMI) 30.0-30.9, adult: Secondary | ICD-10-CM

## 2020-02-03 DIAGNOSIS — J3081 Allergic rhinitis due to animal (cat) (dog) hair and dander: Secondary | ICD-10-CM | POA: Diagnosis not present

## 2020-02-03 DIAGNOSIS — J301 Allergic rhinitis due to pollen: Secondary | ICD-10-CM | POA: Diagnosis not present

## 2020-02-03 DIAGNOSIS — E781 Pure hyperglyceridemia: Secondary | ICD-10-CM

## 2020-02-03 DIAGNOSIS — E559 Vitamin D deficiency, unspecified: Secondary | ICD-10-CM | POA: Diagnosis not present

## 2020-02-03 DIAGNOSIS — E669 Obesity, unspecified: Secondary | ICD-10-CM | POA: Diagnosis not present

## 2020-02-03 DIAGNOSIS — R7303 Prediabetes: Secondary | ICD-10-CM | POA: Diagnosis not present

## 2020-02-03 DIAGNOSIS — J3089 Other allergic rhinitis: Secondary | ICD-10-CM | POA: Diagnosis not present

## 2020-02-03 MED ORDER — ICOSAPENT ETHYL 1 G PO CAPS: 2.0000 g | ORAL_CAPSULE | Freq: Two times a day (BID) | ORAL | 0 refills | Status: DC

## 2020-02-03 NOTE — Progress Notes (Signed)
Chief Complaint:   OBESITY [name] Park is here to discuss her progress with her obesity treatment plan along with follow-up of her obesity related diagnoses. 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 [name] Park states she is walking 15-25 minutes 2-3 times per week.  Today's visit was #: 8 Starting weight: 177 lbs Starting date: 10/02/2019 Today's weight: 159 lbs Today's date: 02/03/2020 Total lbs lost to date: 18 Total lbs lost since last in-office visit: 2  Interim History: [name] Park had Thanksgiving lunch at her parents' country club and then enjoyed a walk after the meal. She has been tracking intake 100% and estimates to hit her calorie and protein goals greater than 85% of the time. She requests recent lab results to be faxed to Dr. Osvaldo Shipper.  Subjective:   Vitamin D deficiency. Vitamin D level on 01/14/2020 was 55.9, at goal.  Lab results discussed with pt today.   Ref. Range 01/14/2020 08:22  Vitamin D, 25-Hydroxy Latest Ref Range: 30.0 - 100.0 ng/mL 55.9   Hypertriglyceridemia. Lipid panel on 01/14/2020 showed total and LDL cholesterol levels at goal. Triglycerides continue to be quite elevated, however, improved from last check - 321, down from 400 on 10/02/2019. She has been taking OTC [employer] supplements. Lab results discussed with pt today.   Ref. Range 01/14/2020 08:22  Triglycerides Latest Ref Range: 0 - 149 mg/dL 734 (H)   Prediabetes. [name] Park has a diagnosis of prediabetes based on her elevated HgA1c and was informed this puts her at greater risk of developing diabetes. She continues to work on diet and exercise to decrease her risk of diabetes. She denies nausea or hypoglycemia. 01/14/2020 blood glucose 91 with A1c 5.6, reduced from 6.0 on 10/02/2019. She had a great reduction in her insulin level at 28.3,  down from 69.5 on 10/02/2019. [name] Park is on metformin 500 mg 4 tablets daily spaced out with meals and is managed by Dr. Talmage Nap of Endocrinology. Lab results discussed with pt today.  Lab Results  Component Value Date   HGBA1C 5.6 01/14/2020   Lab Results  Component Value Date   INSULIN 28.3 (H) 01/14/2020   INSULIN 69.5 (H) 10/02/2019   At risk for heart disease. [name] Park is at a higher than average risk for cardiovascular disease due to hypertriglyceridemia and obesity.   Assessment/Plan:   Vitamin D deficiency. Low Vitamin D level contributes to fatigue and are associated with obesity, breast, and colon cancer. She will convert to OTC Vitamin D3 5,000 IU once daily and will follow-up for routine testing of Vitamin D, at least 2-3 times per year to avoid over-replacement.  Hypertriglyceridemia. [name] Park will stop OTC [place] and start Vascepa 2 mg BID with meals, #60 with 0 refills.  Prediabetes. [name] Park will continue to work on weight loss, exercise, and decreasing simple carbohydrates to help decrease the risk of diabetes. She will continue metformin as directed and will continue healthy eating and regular walking.  At risk for heart disease. [name] Park was given approximately 15 minutes of coronary artery disease prevention counseling today. She is 44 y.o. female and has risk factors for heart disease including obesity. We discussed intensive lifestyle modifications today with an emphasis on specific weight loss instructions and strategies.   Repetitive spaced learning was employed today to elicit superior memory formation and behavioral change.  Class 1 obesity  with serious comorbidity and body mass index (BMI) of 30.0 to 30.9 in adult, unspecified obesity type - BMI greater than 30 at start of program. [name] Park will continue liraglutide 1.2 mg daily (no medication refill today).  [name] Park is currently in the action stage of change. As such, her goal is to continue with weight loss efforts. She  has agreed to keeping a food journal and adhering to recommended goals of 1300 calories and 75 grams of protein daily.   Exercise goals: [name] Park will continue walking 15-25 minutes 2-3 times per week.  Behavioral modification strategies: increasing lean protein intake, no skipping meals, meal planning and cooking strategies, planning for success and keeping a strict food journal.  [name] Park has agreed to follow-up with our clinic in 2-3 weeks. She was informed of the importance of frequent follow-up visits to maximize her success with intensive lifestyle modifications for her multiple health conditions.   Objective:   Blood pressure 113/72, pulse (!) 108, temperature 98 F (36.7 C), height 5\' 2"  (1.575 m), weight 159 lb (72.1 kg), SpO2 97 %. Body mass index is 29.08 kg/m.  General: Cooperative, alert, well developed, in no acute distress. HEENT: Conjunctivae and lids unremarkable. Cardiovascular: Regular rhythm.  Lungs: Normal work of breathing. Neurologic: No focal deficits.   Lab Results  Component Value Date   CREATININE 0.68 01/14/2020   BUN 7 01/14/2020   NA 140 01/14/2020   K 4.7 01/14/2020   CL 103 01/14/2020   CO2 21 01/14/2020   Lab Results  Component Value Date   ALT 13 01/14/2020   AST 17 01/14/2020   ALKPHOS 87 01/14/2020   BILITOT <0.2 01/14/2020   Lab Results  Component Value Date   HGBA1C 5.6 01/14/2020   HGBA1C 6.0 (H) 10/02/2019   Lab Results  Component Value Date   INSULIN 28.3 (H) 01/14/2020   INSULIN 69.5 (H) 10/02/2019   Lab Results  Component Value Date   TSH 1.510 10/02/2019   Lab Results  Component Value Date   CHOL 185 01/14/2020   HDL 40 01/14/2020   LDLCALC 92 01/14/2020   TRIG 321 (H) 01/14/2020   CHOLHDL 4.6 (H) 01/14/2020   Lab Results  Component Value Date   WBC 8.1 10/02/2019   HGB 13.3 10/02/2019   HCT 39.1 10/02/2019   MCV 89 10/02/2019   PLT 306 10/02/2019   No results found for: IRON, TIBC, FERRITIN  Attestation  Statements:   Reviewed by clinician on day of visit: allergies, medications, problem list, medical history, surgical history, family history, social history, and previous encounter notes.  I, 10/04/2019, am acting as [name] Park for Energy manager, NP-C   I have reviewed the above documentation for accuracy and completeness, and I agree with the above. -  Sarahjane Matherly d. Anelia Carriveau, NP-C

## 2020-02-05 ENCOUNTER — Encounter (INDEPENDENT_AMBULATORY_CARE_PROVIDER_SITE_OTHER): Payer: Self-pay

## 2020-02-06 DIAGNOSIS — J301 Allergic rhinitis due to pollen: Secondary | ICD-10-CM | POA: Diagnosis not present

## 2020-02-06 DIAGNOSIS — J3089 Other allergic rhinitis: Secondary | ICD-10-CM | POA: Diagnosis not present

## 2020-02-06 DIAGNOSIS — J3081 Allergic rhinitis due to animal (cat) (dog) hair and dander: Secondary | ICD-10-CM | POA: Diagnosis not present

## 2020-02-10 DIAGNOSIS — J3089 Other allergic rhinitis: Secondary | ICD-10-CM | POA: Diagnosis not present

## 2020-02-10 DIAGNOSIS — J3081 Allergic rhinitis due to animal (cat) (dog) hair and dander: Secondary | ICD-10-CM | POA: Diagnosis not present

## 2020-02-10 DIAGNOSIS — J301 Allergic rhinitis due to pollen: Secondary | ICD-10-CM | POA: Diagnosis not present

## 2020-02-12 DIAGNOSIS — F411 Generalized anxiety disorder: Secondary | ICD-10-CM | POA: Diagnosis not present

## 2020-02-17 DIAGNOSIS — R7301 Impaired fasting glucose: Secondary | ICD-10-CM | POA: Diagnosis not present

## 2020-02-17 DIAGNOSIS — E039 Hypothyroidism, unspecified: Secondary | ICD-10-CM | POA: Diagnosis not present

## 2020-02-24 DIAGNOSIS — E039 Hypothyroidism, unspecified: Secondary | ICD-10-CM | POA: Diagnosis not present

## 2020-02-24 DIAGNOSIS — R7301 Impaired fasting glucose: Secondary | ICD-10-CM | POA: Diagnosis not present

## 2020-02-24 DIAGNOSIS — E669 Obesity, unspecified: Secondary | ICD-10-CM | POA: Diagnosis not present

## 2020-02-24 DIAGNOSIS — E282 Polycystic ovarian syndrome: Secondary | ICD-10-CM | POA: Diagnosis not present

## 2020-02-25 ENCOUNTER — Encounter (INDEPENDENT_AMBULATORY_CARE_PROVIDER_SITE_OTHER): Payer: Self-pay | Admitting: Adult Health

## 2020-02-25 ENCOUNTER — Ambulatory Visit (INDEPENDENT_AMBULATORY_CARE_PROVIDER_SITE_OTHER): Payer: BC Managed Care – PPO | Admitting: Adult Health

## 2020-02-25 ENCOUNTER — Other Ambulatory Visit: Payer: Self-pay

## 2020-02-25 VITALS — BP 105/69 | HR 106 | Temp 97.7°F | Ht 62.0 in | Wt 156.0 lb

## 2020-02-25 DIAGNOSIS — Z9189 Other specified personal risk factors, not elsewhere classified: Secondary | ICD-10-CM

## 2020-02-25 DIAGNOSIS — E669 Obesity, unspecified: Secondary | ICD-10-CM | POA: Diagnosis not present

## 2020-02-25 DIAGNOSIS — E781 Pure hyperglyceridemia: Secondary | ICD-10-CM | POA: Diagnosis not present

## 2020-02-25 DIAGNOSIS — Z6830 Body mass index (BMI) 30.0-30.9, adult: Secondary | ICD-10-CM | POA: Diagnosis not present

## 2020-02-25 DIAGNOSIS — R7303 Prediabetes: Secondary | ICD-10-CM | POA: Diagnosis not present

## 2020-02-25 MED ORDER — SAXENDA 18 MG/3ML ~~LOC~~ SOPN
1.2000 mg | PEN_INJECTOR | Freq: Every day | SUBCUTANEOUS | 0 refills | Status: DC
Start: 2020-02-25 — End: 2020-06-03

## 2020-02-25 MED ORDER — ICOSAPENT ETHYL 1 G PO CAPS
2.0000 g | ORAL_CAPSULE | Freq: Two times a day (BID) | ORAL | 0 refills | Status: DC
Start: 2020-02-25 — End: 2020-03-18

## 2020-02-25 NOTE — Progress Notes (Signed)
Chief Complaint:   [name] Park is here to discuss her progress with her [name] treatment plan along with follow-up of her [name] related diagnoses. [name] Park is on the Category 2 Plan and journaling and states she is following her eating plan approximately 70-80% of the time. [name] Park states she is walking 15-20 minutes 2-3 times per week.  Today's visit was #: 9 Starting weight: 177 lbs Starting date: 10/02/2019 Today's weight: 156 lbs Today's date: 02/25/2020 Total lbs lost to date: 21 Total lbs lost since last in-office visit: 3  Interim History: [name] Park is using the Category 2 plan "as a guideline" and tracking greater than 80% of the time. Her insurance sent her a letter stating Saxenda/Wegovy will not be covered in the New Year. She is on Saxenda 1.2 mg daily and denies mass in neck, dysphagia, dyspepsia, or persistent hoarseness.  Subjective:   Prediabetes. [name] Park has a diagnosis of prediabetes based on her elevated HgA1c and was informed this puts her at greater risk of developing diabetes. She continues to work on diet and exercise to decrease her risk of diabetes. She denies nausea or hypoglycemia. A1c is improving - 5.6 on 01/14/2020. [name] Park is on metformin - 4 tabs of metformin 500 mg daily spaced out with meals. She is managed by Dr. Balan/Endocrinology  Lab Results  Component Value Date   HGBA1C 5.6 01/14/2020   Lab Results  Component Value Date   INSULIN 28.3 (H) 01/14/2020   INSULIN 69.5 (H) 10/02/2019   Hypertriglyceridemia. [name] Park started Vascepa 1 gram 2 caps BID, which she is tolerating well.  At risk for heart disease. [name] Park is at a higher than average risk for cardiovascular disease due to hypertriglyceridemia and [name].   Assessment/Plan:   Prediabetes. [name] Park will continue to work on weight loss, exercise, and decreasing simple carbohydrates to help decrease the risk of diabetes. She will continue metformin as directed and continue to  follow the journaling plan.  Hypertriglyceridemia. Refill was given for icosapent Ethyl (VASCEPA) 1 g capsule 2 caps BID, dispense 120 caps with 0 refills.  At risk for heart disease. [name] Park was given approximately 15 minutes of coronary artery disease prevention counseling today. She is 44 y.o. female and has risk factors for heart disease including [name]. We discussed intensive lifestyle modifications today with an emphasis on specific weight loss instructions and strategies.   Repetitive spaced learning was employed today to elicit superior memory formation and behavioral change.  Class 1 [name] with serious comorbidity and body mass index (BMI) of 30.0 to 30.9 in adult, unspecified [name] type - BMI greater than 30 at start of program. Refill was given for Liraglutide -Weight Management (SAXENDA) 18 MG/3ML SOPN 1.2 mg daily, dispense 15 mL with 0 refills.  [name] Park is currently in the action stage of change. As such, her goal is to continue with weight loss efforts. She has agreed to keeping a food journal and adhering to recommended goals of 1300 calories and 75 grams of protein daily.   Handouts were provided on Holiday Strategies and Holiday Recipes.  Exercise goals: [name] Park will continue walking 15-20 minutes 2-3 times per week.  Behavioral modification strategies: increasing lean protein intake, increasing water intake, meal planning and cooking strategies, planning for success and keeping a strict food journal.  [name] Park has agreed to follow-up with our clinic in 3 weeks. She was informed of the importance of frequent follow-up visits to maximize her success with intensive lifestyle modifications for her multiple health conditions.  Objective:   Blood pressure 105/69, pulse (!) 106, temperature 97.7 F (36.5 C), height 5\' 2"  (1.575 m), weight 156 lb (70.8 kg), SpO2 98 %. Body mass index is 28.53 kg/m.  General: Cooperative, alert, well developed, in no acute distress. HEENT:  Conjunctivae and lids unremarkable. Cardiovascular: Regular rhythm.  Lungs: Normal work of breathing. Neurologic: No focal deficits.   Lab Results  Component Value Date   CREATININE 0.68 01/14/2020   BUN 7 01/14/2020   NA 140 01/14/2020   K 4.7 01/14/2020   CL 103 01/14/2020   CO2 21 01/14/2020   Lab Results  Component Value Date   ALT 13 01/14/2020   AST 17 01/14/2020   ALKPHOS 87 01/14/2020   BILITOT <0.2 01/14/2020   Lab Results  Component Value Date   HGBA1C 5.6 01/14/2020   HGBA1C 6.0 (H) 10/02/2019   Lab Results  Component Value Date   INSULIN 28.3 (H) 01/14/2020   INSULIN 69.5 (H) 10/02/2019   Lab Results  Component Value Date   TSH 1.510 10/02/2019   Lab Results  Component Value Date   CHOL 185 01/14/2020   HDL 40 01/14/2020   LDLCALC 92 01/14/2020   TRIG 321 (H) 01/14/2020   CHOLHDL 4.6 (H) 01/14/2020   Lab Results  Component Value Date   WBC 8.1 10/02/2019   HGB 13.3 10/02/2019   HCT 39.1 10/02/2019   MCV 89 10/02/2019   PLT 306 10/02/2019   No results found for: IRON, TIBC, FERRITIN  Attestation Statements:   Reviewed by clinician on day of visit: allergies, medications, problem list, medical history, surgical history, family history, social history, and previous encounter notes.  I, 10/04/2019, am acting as Marianna Payment for Energy manager, NP-C   I have reviewed the above documentation for accuracy and completeness, and I agree with the above. -  Nicolaus Andel d. Aayan Haskew, NP-C

## 2020-03-02 DIAGNOSIS — J3081 Allergic rhinitis due to animal (cat) (dog) hair and dander: Secondary | ICD-10-CM | POA: Diagnosis not present

## 2020-03-02 DIAGNOSIS — J3089 Other allergic rhinitis: Secondary | ICD-10-CM | POA: Diagnosis not present

## 2020-03-02 DIAGNOSIS — J301 Allergic rhinitis due to pollen: Secondary | ICD-10-CM | POA: Diagnosis not present

## 2020-03-16 DIAGNOSIS — M542 Cervicalgia: Secondary | ICD-10-CM | POA: Diagnosis not present

## 2020-03-16 DIAGNOSIS — G43719 Chronic migraine without aura, intractable, without status migrainosus: Secondary | ICD-10-CM | POA: Diagnosis not present

## 2020-03-17 DIAGNOSIS — F411 Generalized anxiety disorder: Secondary | ICD-10-CM | POA: Diagnosis not present

## 2020-03-18 ENCOUNTER — Encounter (INDEPENDENT_AMBULATORY_CARE_PROVIDER_SITE_OTHER): Payer: Self-pay | Admitting: Family Medicine

## 2020-03-18 ENCOUNTER — Other Ambulatory Visit: Payer: Self-pay

## 2020-03-18 ENCOUNTER — Telehealth (INDEPENDENT_AMBULATORY_CARE_PROVIDER_SITE_OTHER): Payer: BC Managed Care – PPO | Admitting: Family Medicine

## 2020-03-18 DIAGNOSIS — E669 Obesity, unspecified: Secondary | ICD-10-CM | POA: Diagnosis not present

## 2020-03-18 DIAGNOSIS — L659 Nonscarring hair loss, unspecified: Secondary | ICD-10-CM

## 2020-03-18 DIAGNOSIS — R7303 Prediabetes: Secondary | ICD-10-CM

## 2020-03-18 DIAGNOSIS — E781 Pure hyperglyceridemia: Secondary | ICD-10-CM | POA: Diagnosis not present

## 2020-03-18 DIAGNOSIS — Z6830 Body mass index (BMI) 30.0-30.9, adult: Secondary | ICD-10-CM

## 2020-03-18 MED ORDER — ICOSAPENT ETHYL 1 G PO CAPS: 2.0000 g | ORAL_CAPSULE | Freq: Two times a day (BID) | ORAL | 0 refills | Status: DC

## 2020-03-22 NOTE — Progress Notes (Signed)
TeleHealth Visit:  Due to the COVID-19 pandemic, this visit was completed with telemedicine (audio/video) technology to reduce patient and provider exposure as well as to preserve personal protective equipment.   [name] Park has verbally consented to this TeleHealth visit. The patient is located at home, the provider is located at the [facility] office. The participants in this visit include the listed provider and patient. The visit was conducted today via MyChart video.   Chief Complaint: OBESITY [name] Park is here to discuss her progress with her obesity treatment plan along with follow-up of her obesity related diagnoses. [name] Park is on keeping a food journal and adhering to recommended goals of 1300 calories and 75 grams of protein daily and states she is following her eating plan approximately 75-85% of the time. [name] Park states she is walking for 15-20 minutes 2-3 times per week.  Today's visit was #: 10 Starting weight: 177 lbs Starting date: 10/02/2019  Interim History: [name] Park thinks she has lost 2 lbs, but no weight is reported today. Her goal weight is 140-150 lbs. She exceeds 1300 calories most days. She notes more cravings for sweets. She is on Saxenda 1.2 mg daily. She meets her protein goals 80-95%.  Subjective:   1. Hypertriglyceridemia [name] Park was started on Vascepa at her last office visit. She denies blood in her stool or other bleeding. She denies peripheral edema. Last triglycerides was 321 on 01/14/2020, which is down from 400 on 10/02/2019. She was on krill oil prior to Vascepa. Likely familial, her father had high triglycerides.  Lab Results  Component Value Date   ALT 13 01/14/2020   AST 17 01/14/2020   ALKPHOS 87 01/14/2020   BILITOT <0.2 01/14/2020   Lab Results  Component Value Date   CHOL 185 01/14/2020   HDL 40 01/14/2020   LDLCALC 92 01/14/2020   TRIG 321 (H) 01/14/2020   CHOLHDL 4.6 (H) 01/14/2020   The 10-year ASCVD risk score Denman George DC Jr.,  et al., 2013) is: 0.7%   Values used to calculate the score:     Age: 9 years     Sex: Female     Is Non-Hispanic African American: No     Diabetic: No     Tobacco smoker: No     Systolic Blood Pressure: 105 mmHg     Is BP treated: No     HDL Cholesterol: 40 mg/dL     Total Cholesterol: 185 mg/dL  2. Pre-diabetes Ia's last A1c was 5.6 on 01/14/2020, down from 6.0 on 10/02/2019. She is on Saxenda, and her appetite is well controlled with Saxenda and metformin.   Lab Results  Component Value Date   HGBA1C 5.6 01/14/2020   Lab Results  Component Value Date   INSULIN 28.3 (H) 01/14/2020   INSULIN 69.5 (H) 10/02/2019   3. Hair loss [name] Park notes hair loss recently and she wonders what the cause is. Her last TSH was within normal limits in July 2021. She is not anemia.  Assessment/Plan:   1. Hypertriglyceridemia  We will refill Vascepa for 1 month.  - icosapent Ethyl (VASCEPA) 1 g capsule; Take 2 capsules (2 g total) by mouth 2 (two) times daily.  Dispense: 120 capsule; Refill: 0  2. Pre-diabetes [name] Park will continue Saxenda and metformin,.  3. Hair loss [name] was advised that this is likely from weight loss. She was encouraged to get at least 75 grams of protein daily and start hair, skin, and nails vitamins (biotin) daily. She was advised  that her will grow back if it is from weight loss.   4. Class 1 obesity with serious comorbidity and body mass index (BMI) of 30.0 to 30.9 in adult, unspecified obesity type [name] Park is currently in the action stage of change. As such, her goal is to continue with weight loss efforts. She has agreed to the Category 2 Plan or keeping a food journal and adhering to recommended goals of 1300 calories and 75 grams of protein daily.   Exercise goals: Increase to 150 minutes of cardio per week.  Behavioral modification strategies: increasing lean protein intake and planning for success.  [name] Park has agreed to follow-up with our clinic in 3  weeks.   Objective:   VITALS: Per patient if applicable, see vitals. GENERAL: Alert and in no acute distress. CARDIOPULMONARY: No increased WOB. Speaking in clear sentences.  PSYCH: Pleasant and cooperative. Speech normal rate and rhythm. Affect is appropriate. Insight and judgement are appropriate. Attention is focused, linear, and appropriate.  NEURO: Oriented as arrived to appointment on time with no prompting.   Lab Results  Component Value Date   CREATININE 0.68 01/14/2020   BUN 7 01/14/2020   NA 140 01/14/2020   K 4.7 01/14/2020   CL 103 01/14/2020   CO2 21 01/14/2020   Lab Results  Component Value Date   ALT 13 01/14/2020   AST 17 01/14/2020   ALKPHOS 87 01/14/2020   BILITOT <0.2 01/14/2020   Lab Results  Component Value Date   HGBA1C 5.6 01/14/2020   HGBA1C 6.0 (H) 10/02/2019   Lab Results  Component Value Date   INSULIN 28.3 (H) 01/14/2020   INSULIN 69.5 (H) 10/02/2019   Lab Results  Component Value Date   TSH 1.510 10/02/2019   Lab Results  Component Value Date   CHOL 185 01/14/2020   HDL 40 01/14/2020   LDLCALC 92 01/14/2020   TRIG 321 (H) 01/14/2020   CHOLHDL 4.6 (H) 01/14/2020   Lab Results  Component Value Date   WBC 8.1 10/02/2019   HGB 13.3 10/02/2019   HCT 39.1 10/02/2019   MCV 89 10/02/2019   PLT 306 10/02/2019   No results found for: IRON, TIBC, FERRITIN  Attestation Statements:   Reviewed by clinician on day of visit: allergies, medications, problem list, medical history, surgical history, family history, social history, and previous encounter notes.   Trude Mcburney, am acting as Energy manager for [place], FNP-C.  I have reviewed the above documentation for accuracy and completeness, and I agree with the above. -Jesse Sans, FNP

## 2020-03-23 ENCOUNTER — Encounter (INDEPENDENT_AMBULATORY_CARE_PROVIDER_SITE_OTHER): Payer: Self-pay | Admitting: Family Medicine

## 2020-03-30 DIAGNOSIS — J40 Bronchitis, not specified as acute or chronic: Secondary | ICD-10-CM | POA: Diagnosis not present

## 2020-03-30 DIAGNOSIS — B9689 Other specified bacterial agents as the cause of diseases classified elsewhere: Secondary | ICD-10-CM | POA: Diagnosis not present

## 2020-03-30 DIAGNOSIS — J0100 Acute maxillary sinusitis, unspecified: Secondary | ICD-10-CM | POA: Diagnosis not present

## 2020-04-05 DIAGNOSIS — J3089 Other allergic rhinitis: Secondary | ICD-10-CM | POA: Diagnosis not present

## 2020-04-05 DIAGNOSIS — J3081 Allergic rhinitis due to animal (cat) (dog) hair and dander: Secondary | ICD-10-CM | POA: Diagnosis not present

## 2020-04-05 DIAGNOSIS — J301 Allergic rhinitis due to pollen: Secondary | ICD-10-CM | POA: Diagnosis not present

## 2020-04-07 ENCOUNTER — Other Ambulatory Visit: Payer: Self-pay

## 2020-04-07 ENCOUNTER — Encounter (INDEPENDENT_AMBULATORY_CARE_PROVIDER_SITE_OTHER): Payer: Self-pay | Admitting: Adult Health

## 2020-04-07 ENCOUNTER — Ambulatory Visit (INDEPENDENT_AMBULATORY_CARE_PROVIDER_SITE_OTHER): Payer: BC Managed Care – PPO | Admitting: Adult Health

## 2020-04-07 VITALS — BP 114/78 | HR 107 | Temp 97.5°F | Ht 62.0 in | Wt 149.0 lb

## 2020-04-07 DIAGNOSIS — E669 Obesity, unspecified: Secondary | ICD-10-CM | POA: Diagnosis not present

## 2020-04-07 DIAGNOSIS — R7303 Prediabetes: Secondary | ICD-10-CM | POA: Diagnosis not present

## 2020-04-07 DIAGNOSIS — E781 Pure hyperglyceridemia: Secondary | ICD-10-CM

## 2020-04-07 DIAGNOSIS — E559 Vitamin D deficiency, unspecified: Secondary | ICD-10-CM | POA: Diagnosis not present

## 2020-04-07 DIAGNOSIS — Z6830 Body mass index (BMI) 30.0-30.9, adult: Secondary | ICD-10-CM

## 2020-04-07 NOTE — Progress Notes (Signed)
Chief Complaint:   OBESITY [name] Park is here to discuss her progress with her obesity treatment plan along with follow-up of her obesity related diagnoses. [name] is on the Category 2 Plan and keeping a food journal and adhering to recommended goals of 1300 calories and 75 g protein and states she is following her eating plan approximately 75-85% of the time. [name] states she is doing 0 minutes 0 times per week.  Today's visit was #: 11 Starting weight: 177 lbs Starting date: 10/02/2019 Today's weight: 149 lbs Today's date: 04/07/2020 Total lbs lost to date: 28 lbs Total lbs lost since last in-office visit: 7 lbs  Interim History: The last 2 weeks pt experienced acute sinusitis/bronchitis. She denies acute symptoms at present. She feels that she hits journaling goals on Saturday and Sunday. She will often exceed calorie limit M-F by 100-300 calorie per day. However, she will hit protein goals 5 or 6 days a week. She is on Saxenda 1.8 mg, increased from 1.2 mg at last OV due to increased carb cravings. She reports decreased nighttime cravings and polyphagia with increase in GLP-1. Ultimate goal for weight is 145 lbs with BMI 26.5.  Subjective:   1. Hypertriglyceridemia Pt was started on Vascepa 1 g to take 2 capsules BID. She began prescription November 2021. Triglycerides were elevated at last 2 lipid panel checks with normal total and LDL levels. Her father has hypertriglyceridemia.   Lab Results  Component Value Date   CHOL 185 01/14/2020   HDL 40 01/14/2020   LDLCALC 92 01/14/2020   TRIG 321 (H) 01/14/2020   CHOLHDL 4.6 (H) 01/14/2020    2. Vitamin D deficiency [name]'s Vitamin D level was 55.9 on 01/14/2020. She was converted from prescription Vit D to OTC Vit D3 5,000 units daily on 01/14/2020.    Ref. Range 01/14/2020 08:22  Vitamin D, 25-Hydroxy Latest Ref Range: 30.0 - 100.0 ng/mL 55.9   3. Pre-diabetes Pt is on Saxenda 1.8 mg daily. It was increased at last OV due to  polyphagia. She is on Metformin 500 mg, taking 4 tabs daily, spread out with meals. Pt denies any GI upset.  Lab Results  Component Value Date   HGBA1C 5.6 01/14/2020   Lab Results  Component Value Date   INSULIN 28.3 (H) 01/14/2020   INSULIN 69.5 (H) 10/02/2019    Assessment/Plan:   1. Hypertriglyceridemia Continue Vascepa as directed. Check labs at next OV.  2. Vitamin D deficiency Low Vitamin D level contributes to fatigue and are associated with obesity, breast, and colon cancer. She agrees to continue to take OTC Vit D3 supplement daily and will follow-up for routine testing of Vitamin D, at least 2-3 times per year to avoid over-replacement. Check labs at next OV.  3. Pre-diabetes [name] will continue to work on weight loss, exercise, and decreasing simple carbohydrates to help decrease the risk of diabetes. Check labs at next OV.  4. Class 1 obesity with serious comorbidity and body mass index (BMI) of 30.0 to 30.9 in adult, unspecified obesity type [name] is currently in the action stage of change. As such, her goal is to continue with weight loss efforts. She has agreed to keeping a food journal and adhering to recommended goals of 1300 calories and 75 g protein.   Waist measurement at next OV.  Exercise goals: 15-20 minutes of exercise 3 days a week  Behavioral modification strategies: increasing lean protein intake, keeping healthy foods in the home, planning for success and  keeping a strict food journal.  Kenidee has agreed to follow-up with our clinic in 2-3 weeks. She was informed of the importance of frequent follow-up visits to maximize her success with intensive lifestyle modifications for her multiple health conditions.   Objective:   Blood pressure 114/78, pulse (!) 107, temperature (!) 97.5 F (36.4 C), height 5\' 2"  (1.575 m), weight 149 lb (67.6 kg), SpO2 97 %. Body mass index is 27.25 kg/m.  General: Cooperative, alert, well developed, in no acute  distress. HEENT: Conjunctivae and lids unremarkable. Cardiovascular: Regular rhythm.  Lungs: Normal work of breathing. Neurologic: No focal deficits.   Lab Results  Component Value Date   CREATININE 0.68 01/14/2020   BUN 7 01/14/2020   NA 140 01/14/2020   K 4.7 01/14/2020   CL 103 01/14/2020   CO2 21 01/14/2020   Lab Results  Component Value Date   ALT 13 01/14/2020   AST 17 01/14/2020   ALKPHOS 87 01/14/2020   BILITOT <0.2 01/14/2020   Lab Results  Component Value Date   HGBA1C 5.6 01/14/2020   HGBA1C 6.0 (H) 10/02/2019   Lab Results  Component Value Date   INSULIN 28.3 (H) 01/14/2020   INSULIN 69.5 (H) 10/02/2019   Lab Results  Component Value Date   TSH 1.510 10/02/2019   Lab Results  Component Value Date   CHOL 185 01/14/2020   HDL 40 01/14/2020   LDLCALC 92 01/14/2020   TRIG 321 (H) 01/14/2020   CHOLHDL 4.6 (H) 01/14/2020   Lab Results  Component Value Date   WBC 8.1 10/02/2019   HGB 13.3 10/02/2019   HCT 39.1 10/02/2019   MCV 89 10/02/2019   PLT 306 10/02/2019     Attestation Statements:   Reviewed by clinician on day of visit: allergies, medications, problem list, medical history, surgical history, family history, social history, and previous encounter notes.  Time spent on visit including pre-visit chart review and post-visit care and charting was 34 minutes.   10/04/2019, am acting as Edmund Hilda for Energy manager, NP.  I have reviewed the above documentation for accuracy and completeness, and I agree with the above. -  Tesla Keeler d. Mahagony Grieb, NP-C

## 2020-04-08 DIAGNOSIS — F411 Generalized anxiety disorder: Secondary | ICD-10-CM | POA: Diagnosis not present

## 2020-04-13 ENCOUNTER — Encounter (INDEPENDENT_AMBULATORY_CARE_PROVIDER_SITE_OTHER): Payer: Self-pay

## 2020-04-13 DIAGNOSIS — E039 Hypothyroidism, unspecified: Secondary | ICD-10-CM | POA: Insufficient documentation

## 2020-04-20 DIAGNOSIS — J3089 Other allergic rhinitis: Secondary | ICD-10-CM | POA: Diagnosis not present

## 2020-04-20 DIAGNOSIS — J3081 Allergic rhinitis due to animal (cat) (dog) hair and dander: Secondary | ICD-10-CM | POA: Diagnosis not present

## 2020-04-20 DIAGNOSIS — J301 Allergic rhinitis due to pollen: Secondary | ICD-10-CM | POA: Diagnosis not present

## 2020-04-21 ENCOUNTER — Other Ambulatory Visit: Payer: Self-pay

## 2020-04-21 ENCOUNTER — Encounter (INDEPENDENT_AMBULATORY_CARE_PROVIDER_SITE_OTHER): Payer: Self-pay | Admitting: Adult Health

## 2020-04-21 ENCOUNTER — Ambulatory Visit (INDEPENDENT_AMBULATORY_CARE_PROVIDER_SITE_OTHER): Payer: BC Managed Care – PPO | Admitting: Adult Health

## 2020-04-21 VITALS — BP 102/67 | HR 105 | Temp 97.5°F | Ht 62.0 in | Wt 149.0 lb

## 2020-04-21 DIAGNOSIS — R7303 Prediabetes: Secondary | ICD-10-CM

## 2020-04-21 DIAGNOSIS — Z9189 Other specified personal risk factors, not elsewhere classified: Secondary | ICD-10-CM | POA: Diagnosis not present

## 2020-04-21 DIAGNOSIS — E781 Pure hyperglyceridemia: Secondary | ICD-10-CM

## 2020-04-21 DIAGNOSIS — E559 Vitamin D deficiency, unspecified: Secondary | ICD-10-CM | POA: Diagnosis not present

## 2020-04-21 DIAGNOSIS — Z6830 Body mass index (BMI) 30.0-30.9, adult: Secondary | ICD-10-CM

## 2020-04-21 DIAGNOSIS — E663 Overweight: Secondary | ICD-10-CM

## 2020-04-21 DIAGNOSIS — E669 Obesity, unspecified: Secondary | ICD-10-CM

## 2020-04-21 MED ORDER — ICOSAPENT ETHYL 1 G PO CAPS
2.0000 g | ORAL_CAPSULE | Freq: Two times a day (BID) | ORAL | 0 refills | Status: DC
Start: 2020-04-21 — End: 2020-05-13

## 2020-04-22 DIAGNOSIS — F3189 Other bipolar disorder: Secondary | ICD-10-CM | POA: Diagnosis not present

## 2020-04-22 DIAGNOSIS — F1020 Alcohol dependence, uncomplicated: Secondary | ICD-10-CM | POA: Diagnosis not present

## 2020-04-22 LAB — COMPREHENSIVE METABOLIC PANEL
ALT: 11 IU/L (ref 0–32)
AST: 14 IU/L (ref 0–40)
Albumin/Globulin Ratio: 1.5 (ref 1.2–2.2)
Albumin: 4 g/dL (ref 3.8–4.8)
Alkaline Phosphatase: 77 IU/L (ref 44–121)
BUN/Creatinine Ratio: 13 (ref 9–23)
BUN: 11 mg/dL (ref 6–24)
Bilirubin Total: 0.2 mg/dL (ref 0.0–1.2)
CO2: 21 mmol/L (ref 20–29)
Calcium: 9.4 mg/dL (ref 8.7–10.2)
Chloride: 100 mmol/L (ref 96–106)
Creatinine, Ser: 0.82 mg/dL (ref 0.57–1.00)
GFR calc Af Amer: 102 mL/min/{1.73_m2} (ref >59)
GFR calc non Af Amer: 89 mL/min/{1.73_m2} (ref >59)
Globulin, Total: 2.7 g/dL (ref 1.5–4.5)
Glucose: 87 mg/dL (ref 65–99)
Potassium: 4.6 mmol/L (ref 3.5–5.2)
Sodium: 137 mmol/L (ref 134–144)
Total Protein: 6.7 g/dL (ref 6.0–8.5)

## 2020-04-22 LAB — LIPID PANEL
Chol/HDL Ratio: 4.1 ratio (ref 0.0–4.4)
Cholesterol, Total: 181 mg/dL (ref 100–199)
HDL: 44 mg/dL (ref >39)
LDL Chol Calc (NIH): 91 mg/dL (ref 0–99)
Triglycerides: 273 mg/dL — ABNORMAL HIGH (ref 0–149)
VLDL Cholesterol Cal: 46 mg/dL — ABNORMAL HIGH (ref 5–40)

## 2020-04-22 LAB — VITAMIN D 25 HYDROXY (VIT D DEFICIENCY, FRACTURES): Vit D, 25-Hydroxy: 73.4 ng/mL (ref 30.0–100.0)

## 2020-04-22 LAB — INSULIN, RANDOM: INSULIN: 18.7 u[IU]/mL (ref 2.6–24.9)

## 2020-04-22 LAB — HEMOGLOBIN A1C
Est. average glucose Bld gHb Est-mCnc: 108 mg/dL
Hgb A1c MFr Bld: 5.4 % (ref 4.8–5.6)

## 2020-04-22 NOTE — Progress Notes (Signed)
Chief Complaint:   OBESITY [name] Park is here to discuss her progress with her obesity treatment plan along with follow-up of her obesity related diagnoses. [name] Park is on keeping a food journal and adhering to recommended goals of 1300 calories and 75 g protein and states she is following her eating plan approximately 100% of the time. [name] Park states she is walking 15-30 minutes 2-3 times per week.  Today's visit was #: 12 Starting weight: 177 lbs Starting date: 10/02/2019 Today's weight: 149 lbs Today's date: 04/21/2020 Total lbs lost to date: 28 lbs Total lbs lost since last in-office visit: 0  Interim History: [name] Park estimates her calorie intake 1300-1600 per day and hitting 75 g protein per day more than 75% of the time. She is on Saxenda 1.8 mg daily, which was increased from 1.2 mg at last OV due to polyphagie.  [name] Park is not formulary in 2022 for [employer].  Subjective:   1. Hypertriglyceridemia Pt was started on Vascepa 1 g 2 capsules BID on 02/03/2020 due to elevated triglycerides despite healthy eating and OTC Krill oil QD.  Ref. Range 01/14/2020 08:22  Triglycerides Latest Ref Range: 0 - 149 mg/dL 409 (H)    2. Pre-diabetes Pt is on Metformin 500 mg 4 tabs daily. She is managed by Dr. Talmage Nap at endocrinology.  Saxenda was increased from 1.2 mg to 1.8 mg daily at virtual visit 03/18/2020 due to polyphagia. She reports early satiety with dose increase, however, her cravings have decreased.  GLP-1 is managed by this clinic.  Per pt -  [name] Park is not formulary in 2022 for [employer].  3. Vitamin D deficiency [name] Park's Vitamin D level was 55.9 on 01/14/2020. She is currently taking OTC vitamin D 5,000 units each day. She denies nausea, vomiting or muscle weakness.   Ref. Range 01/14/2020 08:22  Vitamin D, 25-Hydroxy Latest Ref Range: 30.0 - 100.0 ng/mL 55.9   4. At risk for complication associated with hypotension The patient is at a higher than average risk of  hypotension due to weight loss and steady weight loss.  Assessment/Plan:   1. Hypertriglyceridemia Cardiovascular risk and specific lipid/LDL goals reviewed.  We discussed several lifestyle modifications today and [name] Park will continue to work on diet, exercise and weight loss efforts. Orders and follow up as documented in patient record. Refill Vascepa and check labs today.  Counseling Intensive lifestyle modifications are the first line treatment for this issue. . Dietary changes: Increase soluble fiber. Decrease simple carbohydrates. . Exercise changes: Moderate to vigorous-intensity aerobic activity 150 minutes per week if tolerated. . Lipid-lowering medications: see documented in medical record.  - icosapent Ethyl (VASCEPA) 1 g capsule; Take 2 capsules (2 g total) by mouth 2 (two) times daily.  Dispense: 120 capsule; Refill: 0 - Comprehensive metabolic panel - Lipid panel  2. Pre-diabetes [name] Park will continue to work on weight loss, exercise, and decreasing simple carbohydrates to help decrease the risk of diabetes. Continue Saxenda 1.8 mg until prescription runs out. Call insurance and inquire which GLP-1 is on formulary.  - Hemoglobin A1c - Insulin, random  3. Vitamin D deficiency Low Vitamin D level contributes to fatigue and are associated with obesity, breast, and colon cancer. She agrees to continue to take continue Vitamin D @5 ,000 IU everyday and will follow-up for routine testing of Vitamin D, at least 2-3 times per year to avoid over-replacement. Check labs today.  - VITAMIN D 25 Hydroxy (Vit-D Deficiency, Fractures)  4. At risk for complication associated with  hypotension [name] Park was given approximately 15 minutes of education and counseling today to help avoid hypotension. We discussed risks of hypotension with weight loss and signs of hypotension such as feeling lightheaded or unsteady. Monitor for symptoms of hypotension.  Repetitive spaced learning was employed today  to elicit superior memory formation and behavioral change.  5. Class 1 obesity with serious comorbidity and body mass index (BMI) of 30.0 to 30.9 in adult, unspecified obesity type [name] Park is currently in the action stage of change. As such, her goal is to continue with weight loss efforts. She has agreed to keeping a food journal and adhering to recommended goals of 1300 calories and 75 g protein.   Check waist circumference at next OV.  Exercise goals: As is  Behavioral modification strategies: increasing lean protein intake, meal planning and cooking strategies, better snacking choices, planning for success and keeping a strict food journal.  [name] Park has agreed to follow-up with our clinic in 2 weeks. She was informed of the importance of frequent follow-up visits to maximize her success with intensive lifestyle modifications for her multiple health conditions.   [name] Park was informed we would discuss her lab results at her next visit unless there is a critical issue that needs to be addressed sooner. [name] Park agreed to keep her next visit at the agreed upon time to discuss these results.  Objective:   Blood pressure 102/67, pulse (!) 105, temperature (!) 97.5 F (36.4 C), height 5\' 2"  (1.575 m), weight 149 lb (67.6 kg), SpO2 98 %. Body mass index is 27.25 kg/m.  General: Cooperative, alert, well developed, in no acute distress. HEENT: Conjunctivae and lids unremarkable. Cardiovascular: Regular rhythm.  Lungs: Normal work of breathing. Neurologic: No focal deficits.   Lab Results  Component Value Date   CREATININE 0.82 04/21/2020   BUN 11 04/21/2020   NA 137 04/21/2020   K 4.6 04/21/2020   CL 100 04/21/2020   CO2 21 04/21/2020   Lab Results  Component Value Date   ALT 11 04/21/2020   AST 14 04/21/2020   ALKPHOS 77 04/21/2020   BILITOT 0.2 04/21/2020   Lab Results  Component Value Date   HGBA1C 5.4 04/21/2020   HGBA1C 5.6 01/14/2020   HGBA1C 6.0 (H) 10/02/2019   Lab  Results  Component Value Date   INSULIN 18.7 04/21/2020   INSULIN 28.3 (H) 01/14/2020   INSULIN 69.5 (H) 10/02/2019   Lab Results  Component Value Date   TSH 1.510 10/02/2019   Lab Results  Component Value Date   CHOL 181 04/21/2020   HDL 44 04/21/2020   LDLCALC 91 04/21/2020   TRIG 273 (H) 04/21/2020   CHOLHDL 4.1 04/21/2020   Lab Results  Component Value Date   WBC 8.1 10/02/2019   HGB 13.3 10/02/2019   HCT 39.1 10/02/2019   MCV 89 10/02/2019   PLT 306 10/02/2019     Attestation Statements:   Reviewed by clinician on day of visit: allergies, medications, problem list, medical history, surgical history, family history, social history, and previous encounter notes.  10/04/2019, am acting as Edmund Hilda for Energy manager, NP.  I have reviewed the above documentation for accuracy and completeness, and I agree with the above. -  Anjelica Gorniak d. Burns Timson, NP-C

## 2020-04-28 DIAGNOSIS — F411 Generalized anxiety disorder: Secondary | ICD-10-CM | POA: Diagnosis not present

## 2020-04-30 DIAGNOSIS — J3081 Allergic rhinitis due to animal (cat) (dog) hair and dander: Secondary | ICD-10-CM | POA: Diagnosis not present

## 2020-04-30 DIAGNOSIS — J3089 Other allergic rhinitis: Secondary | ICD-10-CM | POA: Diagnosis not present

## 2020-04-30 DIAGNOSIS — J301 Allergic rhinitis due to pollen: Secondary | ICD-10-CM | POA: Diagnosis not present

## 2020-04-30 DIAGNOSIS — J4520 Mild intermittent asthma, uncomplicated: Secondary | ICD-10-CM | POA: Diagnosis not present

## 2020-05-04 DIAGNOSIS — M542 Cervicalgia: Secondary | ICD-10-CM | POA: Diagnosis not present

## 2020-05-04 DIAGNOSIS — G43719 Chronic migraine without aura, intractable, without status migrainosus: Secondary | ICD-10-CM | POA: Diagnosis not present

## 2020-05-05 DIAGNOSIS — J3081 Allergic rhinitis due to animal (cat) (dog) hair and dander: Secondary | ICD-10-CM | POA: Diagnosis not present

## 2020-05-05 DIAGNOSIS — J301 Allergic rhinitis due to pollen: Secondary | ICD-10-CM | POA: Diagnosis not present

## 2020-05-06 DIAGNOSIS — J3089 Other allergic rhinitis: Secondary | ICD-10-CM | POA: Diagnosis not present

## 2020-05-13 ENCOUNTER — Other Ambulatory Visit: Payer: Self-pay

## 2020-05-13 ENCOUNTER — Ambulatory Visit (INDEPENDENT_AMBULATORY_CARE_PROVIDER_SITE_OTHER): Payer: BC Managed Care – PPO | Admitting: Adult Health

## 2020-05-13 ENCOUNTER — Encounter (INDEPENDENT_AMBULATORY_CARE_PROVIDER_SITE_OTHER): Payer: Self-pay | Admitting: Adult Health

## 2020-05-13 VITALS — BP 95/64 | HR 103 | Temp 98.0°F | Ht 62.0 in | Wt 147.0 lb

## 2020-05-13 DIAGNOSIS — J3089 Other allergic rhinitis: Secondary | ICD-10-CM | POA: Diagnosis not present

## 2020-05-13 DIAGNOSIS — E781 Pure hyperglyceridemia: Secondary | ICD-10-CM

## 2020-05-13 DIAGNOSIS — E559 Vitamin D deficiency, unspecified: Secondary | ICD-10-CM | POA: Diagnosis not present

## 2020-05-13 DIAGNOSIS — Z9189 Other specified personal risk factors, not elsewhere classified: Secondary | ICD-10-CM | POA: Diagnosis not present

## 2020-05-13 DIAGNOSIS — E669 Obesity, unspecified: Secondary | ICD-10-CM | POA: Diagnosis not present

## 2020-05-13 DIAGNOSIS — Z6832 Body mass index (BMI) 32.0-32.9, adult: Secondary | ICD-10-CM

## 2020-05-13 DIAGNOSIS — R7303 Prediabetes: Secondary | ICD-10-CM

## 2020-05-13 DIAGNOSIS — J301 Allergic rhinitis due to pollen: Secondary | ICD-10-CM | POA: Diagnosis not present

## 2020-05-13 DIAGNOSIS — J3081 Allergic rhinitis due to animal (cat) (dog) hair and dander: Secondary | ICD-10-CM | POA: Diagnosis not present

## 2020-05-13 MED ORDER — ICOSAPENT ETHYL 1 G PO CAPS
2.0000 g | ORAL_CAPSULE | Freq: Two times a day (BID) | ORAL | 0 refills | Status: DC
Start: 2020-05-13 — End: 2020-06-16

## 2020-05-14 DIAGNOSIS — F411 Generalized anxiety disorder: Secondary | ICD-10-CM | POA: Diagnosis not present

## 2020-05-17 NOTE — Progress Notes (Signed)
Chief Complaint:   OBESITY [name] Park is here to discuss her progress with her obesity treatment plan along with follow-up of her obesity related diagnoses. [name] Park is on the Category 2 Plan and keeping a food journal and adhering to recommended goals of 1300 calories and 75 g protein and states she is following her eating plan approximately 75% of the time. [name] Park states she is walking 15-30 minutes 2-3 times per week.  Today's visit was #: 13 Starting weight: 177 lbs Starting date: 10/02/2019 Today's weight: 147 lbs Today's date: 05/13/2020 Total lbs lost to date: 30 lbs Total lbs lost since last in-office visit: 2 lbs  Interim History: [name] Park is tracking intake 100% of the time with hitting goals 75% of the time. Her BP is a little soft today. She is on spironolactone 50 mg. Her OBGYN has her on this for PCOS.  [name] Park's ultimate goal is to lose down to 140 lbs. She is currently 147.8 lbs/BMI 27.  Subjective:   1. Hypertriglyceridemia Discussed labs with patient today. 04/21/2020 Lipid panel resulted total, LDL, and HDL at goal. Her triglycerides are decreasing with Vascepa. Her triglycerides are 273.   Lab Results  Component Value Date   ALT 11 04/21/2020   AST 14 04/21/2020   ALKPHOS 77 04/21/2020   BILITOT 0.2 04/21/2020   Lab Results  Component Value Date   CHOL 181 04/21/2020   HDL 44 04/21/2020   LDLCALC 91 04/21/2020   TRIG 273 (H) 04/21/2020   CHOLHDL 4.1 04/21/2020    2. Vitamin D deficiency [name] Park's Vitamin D level was 73.4 on 04/21/2020. She is currently taking OTC vitamin D 5,000 each day. She denies nausea, vomiting or muscle weakness.   Ref. Range 04/21/2020 09:15  Vitamin D, 25-Hydroxy Latest Ref Range: 30.0 - 100.0 ng/mL 73.4   3. Pre-diabetes 04/21/2020 blood glucose is within normal limits and A1c is at goal 5.4. Her insulin level is dramatically reduced to 18.7. She will complete Saxenda 1.8 mg daily, then remain on Metformin 500 mg.   Lab Results   Component Value Date   HGBA1C 5.4 04/21/2020   Lab Results  Component Value Date   INSULIN 18.7 04/21/2020   INSULIN 28.3 (H) 01/14/2020   INSULIN 69.5 (H) 10/02/2019    4. At risk for complication associated with hypotension The patient is at a higher than average risk of hypotension due to consistent weight loss.   Assessment/Plan:   1. Hypertriglyceridemia Cardiovascular risk and specific lipid/LDL goals reviewed.  We discussed several lifestyle modifications today and [name] Park will continue to work on diet, exercise and weight loss efforts. Orders and follow up as documented in patient record.   Counseling Intensive lifestyle modifications are the first line treatment for this issue. . Dietary changes: Increase soluble fiber. Decrease simple carbohydrates. . Exercise changes: Moderate to vigorous-intensity aerobic activity 150 minutes per week if tolerated. . Lipid-lowering medications: see documented in medical record.  - icosapent Ethyl (VASCEPA) 1 g capsule; Take 2 capsules (2 g total) by mouth 2 (two) times daily.  Dispense: 120 capsule; Refill: 0  2. Vitamin D deficiency Finish OTC Vit D3 5,000 IU QD bottle, then decrease to Vit D3 2,000 IU QD.  3. Pre-diabetes [name] Park will continue to work on weight loss, exercise, and decreasing simple carbohydrates to help decrease the risk of diabetes.   4. At risk for complication associated with hypotension [name] Park was given approximately 15 minutes of education and counseling today to help avoid hypotension. We discussed  risks of hypotension with weight loss and signs of hypotension such as feeling lightheaded or unsteady.  Repetitive spaced learning was employed today to elicit superior memory formation and behavioral change.  5. Class 1 obesity with serious comorbidity and body mass index (BMI) of 32.0 to 32.9 in adult, unspecified obesity type [name] Park is currently in the action stage of change. As such, her goal is to continue  with weight loss efforts. She has agreed to keeping a food journal and adhering to recommended goals of 1300 calories and 75 g protein.   Exercise goals: As is  Behavioral modification strategies: increasing lean protein intake, increasing water intake, meal planning and cooking strategies and planning for success.  [name] Park has agreed to follow-up with our clinic in 3 weeks. She was informed of the importance of frequent follow-up visits to maximize her success with intensive lifestyle modifications for her multiple health conditions.   Objective:   Blood pressure 95/64, pulse (!) 103, temperature 98 F (36.7 C), height 5\' 2"  (1.575 m), weight 147 lb (66.7 kg), SpO2 97 %. Body mass index is 26.89 kg/m.  General: Cooperative, alert, well developed, in no acute distress. HEENT: Conjunctivae and lids unremarkable. Cardiovascular: Regular rhythm.  Lungs: Normal work of breathing. Neurologic: No focal deficits.   Lab Results  Component Value Date   CREATININE 0.82 04/21/2020   BUN 11 04/21/2020   NA 137 04/21/2020   K 4.6 04/21/2020   CL 100 04/21/2020   CO2 21 04/21/2020   Lab Results  Component Value Date   ALT 11 04/21/2020   AST 14 04/21/2020   ALKPHOS 77 04/21/2020   BILITOT 0.2 04/21/2020   Lab Results  Component Value Date   HGBA1C 5.4 04/21/2020   HGBA1C 5.6 01/14/2020   HGBA1C 6.0 (H) 10/02/2019   Lab Results  Component Value Date   INSULIN 18.7 04/21/2020   INSULIN 28.3 (H) 01/14/2020   INSULIN 69.5 (H) 10/02/2019   Lab Results  Component Value Date   TSH 1.510 10/02/2019   Lab Results  Component Value Date   CHOL 181 04/21/2020   HDL 44 04/21/2020   LDLCALC 91 04/21/2020   TRIG 273 (H) 04/21/2020   CHOLHDL 4.1 04/21/2020   Lab Results  Component Value Date   WBC 8.1 10/02/2019   HGB 13.3 10/02/2019   HCT 39.1 10/02/2019   MCV 89 10/02/2019   PLT 306 10/02/2019     Attestation Statements:   Reviewed by clinician on day of visit: allergies,  medications, problem list, medical history, surgical history, family history, social history, and previous encounter notes.  10/04/2019, am acting as Edmund Hilda for Energy manager, NP.  I have reviewed the above documentation for accuracy and completeness, and I agree with the above. -  Denyce Harr d. Brighten Orndoff, NP-C

## 2020-05-19 DIAGNOSIS — J3081 Allergic rhinitis due to animal (cat) (dog) hair and dander: Secondary | ICD-10-CM | POA: Diagnosis not present

## 2020-05-19 DIAGNOSIS — J3089 Other allergic rhinitis: Secondary | ICD-10-CM | POA: Diagnosis not present

## 2020-05-19 DIAGNOSIS — J301 Allergic rhinitis due to pollen: Secondary | ICD-10-CM | POA: Diagnosis not present

## 2020-05-26 DIAGNOSIS — J3089 Other allergic rhinitis: Secondary | ICD-10-CM | POA: Diagnosis not present

## 2020-05-26 DIAGNOSIS — J301 Allergic rhinitis due to pollen: Secondary | ICD-10-CM | POA: Diagnosis not present

## 2020-05-26 DIAGNOSIS — J3081 Allergic rhinitis due to animal (cat) (dog) hair and dander: Secondary | ICD-10-CM | POA: Diagnosis not present

## 2020-05-28 DIAGNOSIS — F411 Generalized anxiety disorder: Secondary | ICD-10-CM | POA: Diagnosis not present

## 2020-06-02 DIAGNOSIS — J301 Allergic rhinitis due to pollen: Secondary | ICD-10-CM | POA: Diagnosis not present

## 2020-06-02 DIAGNOSIS — J3081 Allergic rhinitis due to animal (cat) (dog) hair and dander: Secondary | ICD-10-CM | POA: Diagnosis not present

## 2020-06-02 DIAGNOSIS — J3089 Other allergic rhinitis: Secondary | ICD-10-CM | POA: Diagnosis not present

## 2020-06-03 ENCOUNTER — Other Ambulatory Visit: Payer: Self-pay

## 2020-06-03 ENCOUNTER — Encounter (INDEPENDENT_AMBULATORY_CARE_PROVIDER_SITE_OTHER): Payer: Self-pay | Admitting: Adult Health

## 2020-06-03 ENCOUNTER — Ambulatory Visit (INDEPENDENT_AMBULATORY_CARE_PROVIDER_SITE_OTHER): Payer: BC Managed Care – PPO | Admitting: Adult Health

## 2020-06-03 VITALS — BP 116/72 | HR 97 | Temp 97.5°F | Ht 62.0 in | Wt 148.0 lb

## 2020-06-03 DIAGNOSIS — Z6831 Body mass index (BMI) 31.0-31.9, adult: Secondary | ICD-10-CM | POA: Diagnosis not present

## 2020-06-03 DIAGNOSIS — L659 Nonscarring hair loss, unspecified: Secondary | ICD-10-CM

## 2020-06-03 DIAGNOSIS — E669 Obesity, unspecified: Secondary | ICD-10-CM

## 2020-06-03 DIAGNOSIS — Z9189 Other specified personal risk factors, not elsewhere classified: Secondary | ICD-10-CM | POA: Diagnosis not present

## 2020-06-03 DIAGNOSIS — R7303 Prediabetes: Secondary | ICD-10-CM

## 2020-06-04 LAB — T3: T3, Total: 107 ng/dL (ref 71–180)

## 2020-06-04 LAB — TSH+FREE T4
Free T4: 0.96 ng/dL (ref 0.82–1.77)
TSH: 1.85 u[IU]/mL (ref 0.450–4.500)

## 2020-06-07 DIAGNOSIS — L659 Nonscarring hair loss, unspecified: Secondary | ICD-10-CM | POA: Insufficient documentation

## 2020-06-07 NOTE — Progress Notes (Signed)
Chief Complaint:   OBESITY [name] Park is here to discuss her progress with her obesity treatment plan along with follow-up of her obesity related diagnoses. [name] Park is on the Category 2 Plan and keeping a food journal and adhering to recommended goals of 1300 calories and 75 g protein and states she is following her eating plan approximately 75% of the time. [name] Park states she is walking 15-30 minutes 2-3 times per week.  Today's visit was #: 14 Starting weight: 177 lbs Starting date: 10/02/2019 Today's weight: 148 lbs Today's date: 06/03/2020 Total lbs lost to date: 29 lbs Total lbs lost since last in-office visit: 0  Interim History: [name] Park is concerned with hair loss that has been noticeable since December 2021. She has a history of alopecia (2017). She was previously treated by dermatology.  She estimates to track intake and hit cal/protein goals approximately 75% of the time.  Subjective:   1. Hair loss Since December 2021, [name] Park has noticed more hair in [place]. She denies increase in hair loss in shower when cleaning hair. She was diagnosed with alopecia in 2017, and was treated with topical cream and injection therapy. She reports an increased in stress levels at work.  2. Pre-diabetes [name] Park is on Metformin 500 mg (2 tabs BID) and tolerating it well. Unable to get GLP-1 covered by her insurance.  Lab Results  Component Value Date   HGBA1C 5.4 04/21/2020   Lab Results  Component Value Date   INSULIN 18.7 04/21/2020   INSULIN 28.3 (H) 01/14/2020   INSULIN 69.5 (H) 10/02/2019    3. At risk for diabetes mellitus [name] Park is at higher than average risk for developing diabetes due to obesity and pre-diabetes.  Assessment/Plan:   1. Hair loss Check thyroid panel today. If hair loss continue, follow up with dermatology.  - TSH + free T4 - T3  2. Pre-diabetes [name] Park will continue to work on weight loss, exercise, and decreasing simple carbohydrates to help decrease the  risk of diabetes. Continue Metformin and regular exercise.  3. At risk for diabetes mellitus [name] Park was given approximately 15 minutes of diabetes education and counseling today. We discussed intensive lifestyle modifications today with an emphasis on weight loss as well as increasing exercise and decreasing simple carbohydrates in her diet. We also reviewed medication options with an emphasis on risk versus benefit of those discussed.   Repetitive spaced learning was employed today to elicit superior memory formation and behavioral change.  4. Current BMI 27.1 [name] Park is currently in the action stage of change. As such, her goal is to continue with weight loss efforts. She has agreed to keeping a food journal and adhering to recommended goals of 1300 calories and 75 g protein.   Exercise goals: As is  Behavioral modification strategies: increasing lean protein intake, decreasing simple carbohydrates, meal planning and cooking strategies, planning for success and keeping a strict food journal.  [name] Park has agreed to follow-up with our clinic in 4 weeks. She was informed of the importance of frequent follow-up visits to maximize her success with intensive lifestyle modifications for her multiple health conditions.   [name] Park was informed we would discuss her lab results at her next visit unless there is a critical issue that needs to be addressed sooner. [name] Park agreed to keep her next visit at the agreed upon time to discuss these results.  Objective:   Blood pressure 116/72, pulse 97, temperature (!) 97.5 F (36.4 C), height 5\' 2"  (1.575 m), weight 148 lb (  67.1 kg), SpO2 97 %. Body mass index is 27.07 kg/m.  General: Cooperative, alert, well developed, in no acute distress. HEENT: Conjunctivae and lids unremarkable. Cardiovascular: Regular rhythm.  Lungs: Normal work of breathing. Neurologic: No focal deficits.   Lab Results  Component Value Date   CREATININE 0.82 04/21/2020   BUN 11  04/21/2020   NA 137 04/21/2020   K 4.6 04/21/2020   CL 100 04/21/2020   CO2 21 04/21/2020   Lab Results  Component Value Date   ALT 11 04/21/2020   AST 14 04/21/2020   ALKPHOS 77 04/21/2020   BILITOT 0.2 04/21/2020   Lab Results  Component Value Date   HGBA1C 5.4 04/21/2020   HGBA1C 5.6 01/14/2020   HGBA1C 6.0 (H) 10/02/2019   Lab Results  Component Value Date   INSULIN 18.7 04/21/2020   INSULIN 28.3 (H) 01/14/2020   INSULIN 69.5 (H) 10/02/2019   Lab Results  Component Value Date   TSH 1.850 06/03/2020   Lab Results  Component Value Date   CHOL 181 04/21/2020   HDL 44 04/21/2020   LDLCALC 91 04/21/2020   TRIG 273 (H) 04/21/2020   CHOLHDL 4.1 04/21/2020   Lab Results  Component Value Date   WBC 8.1 10/02/2019   HGB 13.3 10/02/2019   HCT 39.1 10/02/2019   MCV 89 10/02/2019   PLT 306 10/02/2019    Attestation Statements:   Reviewed by clinician on day of visit: allergies, medications, problem list, medical history, surgical history, family history, social history, and previous encounter notes.  [name] Park, am acting as Energy manager for William Hamburger, NP.  I have reviewed the above documentation for accuracy and completeness, and I agree with the above. -  Aaronjames Kelsay d. Alexiah Koroma, NP-C

## 2020-06-09 DIAGNOSIS — J301 Allergic rhinitis due to pollen: Secondary | ICD-10-CM | POA: Diagnosis not present

## 2020-06-09 DIAGNOSIS — J3089 Other allergic rhinitis: Secondary | ICD-10-CM | POA: Diagnosis not present

## 2020-06-09 DIAGNOSIS — J3081 Allergic rhinitis due to animal (cat) (dog) hair and dander: Secondary | ICD-10-CM | POA: Diagnosis not present

## 2020-06-15 DIAGNOSIS — J3089 Other allergic rhinitis: Secondary | ICD-10-CM | POA: Diagnosis not present

## 2020-06-15 DIAGNOSIS — J3081 Allergic rhinitis due to animal (cat) (dog) hair and dander: Secondary | ICD-10-CM | POA: Diagnosis not present

## 2020-06-15 DIAGNOSIS — J301 Allergic rhinitis due to pollen: Secondary | ICD-10-CM | POA: Diagnosis not present

## 2020-06-16 ENCOUNTER — Other Ambulatory Visit (INDEPENDENT_AMBULATORY_CARE_PROVIDER_SITE_OTHER): Payer: Self-pay | Admitting: Adult Health

## 2020-06-16 DIAGNOSIS — F411 Generalized anxiety disorder: Secondary | ICD-10-CM | POA: Diagnosis not present

## 2020-06-16 DIAGNOSIS — E781 Pure hyperglyceridemia: Secondary | ICD-10-CM

## 2020-06-16 DIAGNOSIS — G4733 Obstructive sleep apnea (adult) (pediatric): Secondary | ICD-10-CM | POA: Diagnosis not present

## 2020-06-16 NOTE — Telephone Encounter (Signed)
Refill request

## 2020-06-16 NOTE — Telephone Encounter (Signed)
Pt last seen by Katy Danford, FNP.  

## 2020-06-17 DIAGNOSIS — J3089 Other allergic rhinitis: Secondary | ICD-10-CM | POA: Diagnosis not present

## 2020-06-17 DIAGNOSIS — G43719 Chronic migraine without aura, intractable, without status migrainosus: Secondary | ICD-10-CM | POA: Diagnosis not present

## 2020-06-17 DIAGNOSIS — J301 Allergic rhinitis due to pollen: Secondary | ICD-10-CM | POA: Diagnosis not present

## 2020-06-17 DIAGNOSIS — M542 Cervicalgia: Secondary | ICD-10-CM | POA: Diagnosis not present

## 2020-06-17 DIAGNOSIS — J3081 Allergic rhinitis due to animal (cat) (dog) hair and dander: Secondary | ICD-10-CM | POA: Diagnosis not present

## 2020-06-24 DIAGNOSIS — J301 Allergic rhinitis due to pollen: Secondary | ICD-10-CM | POA: Diagnosis not present

## 2020-06-24 DIAGNOSIS — J3081 Allergic rhinitis due to animal (cat) (dog) hair and dander: Secondary | ICD-10-CM | POA: Diagnosis not present

## 2020-06-24 DIAGNOSIS — J3089 Other allergic rhinitis: Secondary | ICD-10-CM | POA: Diagnosis not present

## 2020-06-29 DIAGNOSIS — J3081 Allergic rhinitis due to animal (cat) (dog) hair and dander: Secondary | ICD-10-CM | POA: Diagnosis not present

## 2020-06-29 DIAGNOSIS — J301 Allergic rhinitis due to pollen: Secondary | ICD-10-CM | POA: Diagnosis not present

## 2020-06-29 DIAGNOSIS — J3089 Other allergic rhinitis: Secondary | ICD-10-CM | POA: Diagnosis not present

## 2020-06-30 DIAGNOSIS — F3189 Other bipolar disorder: Secondary | ICD-10-CM | POA: Diagnosis not present

## 2020-06-30 DIAGNOSIS — F1020 Alcohol dependence, uncomplicated: Secondary | ICD-10-CM | POA: Diagnosis not present

## 2020-07-01 DIAGNOSIS — J301 Allergic rhinitis due to pollen: Secondary | ICD-10-CM | POA: Diagnosis not present

## 2020-07-01 DIAGNOSIS — J3089 Other allergic rhinitis: Secondary | ICD-10-CM | POA: Diagnosis not present

## 2020-07-01 DIAGNOSIS — J3081 Allergic rhinitis due to animal (cat) (dog) hair and dander: Secondary | ICD-10-CM | POA: Diagnosis not present

## 2020-07-06 ENCOUNTER — Ambulatory Visit (INDEPENDENT_AMBULATORY_CARE_PROVIDER_SITE_OTHER): Payer: BC Managed Care – PPO | Admitting: Adult Health

## 2020-07-06 ENCOUNTER — Encounter (INDEPENDENT_AMBULATORY_CARE_PROVIDER_SITE_OTHER): Payer: Self-pay | Admitting: Adult Health

## 2020-07-06 ENCOUNTER — Other Ambulatory Visit: Payer: Self-pay

## 2020-07-06 VITALS — BP 106/69 | HR 101 | Temp 98.0°F | Ht 62.0 in | Wt 149.0 lb

## 2020-07-06 DIAGNOSIS — L659 Nonscarring hair loss, unspecified: Secondary | ICD-10-CM

## 2020-07-06 DIAGNOSIS — Z6830 Body mass index (BMI) 30.0-30.9, adult: Secondary | ICD-10-CM | POA: Diagnosis not present

## 2020-07-06 DIAGNOSIS — J3081 Allergic rhinitis due to animal (cat) (dog) hair and dander: Secondary | ICD-10-CM | POA: Diagnosis not present

## 2020-07-06 DIAGNOSIS — E039 Hypothyroidism, unspecified: Secondary | ICD-10-CM | POA: Diagnosis not present

## 2020-07-06 DIAGNOSIS — E669 Obesity, unspecified: Secondary | ICD-10-CM | POA: Diagnosis not present

## 2020-07-06 DIAGNOSIS — J3089 Other allergic rhinitis: Secondary | ICD-10-CM | POA: Diagnosis not present

## 2020-07-06 DIAGNOSIS — J301 Allergic rhinitis due to pollen: Secondary | ICD-10-CM | POA: Diagnosis not present

## 2020-07-07 NOTE — Progress Notes (Signed)
Chief Complaint:   OBESITY [name] Park is here to discuss her progress with her obesity treatment plan along with follow-up of her obesity related diagnoses. [name] Park is on the Category 2 Plan and keeping a food journal and adhering to recommended goals of 1300 calories and 75 protein and states she is following her eating plan approximately 50-75% of the time. [name] Park states she is walking 15-30 minutes 2-3 times per week.  Today's visit was #: 15 Starting weight: 177 lbs Starting date: 10/02/2019 Today's weight: 149 lbs Today's date: 07/06/2020 Total lbs lost to date: 28 Total lbs lost since last in-office visit: 0  Interim History: Ane tracks intake 100% of the time. She estimates to hit cal/protein goal approximately 50-75%. She will often consume >1300 calories and 60-65 grams of protein per day. She will be traveling to [place], [place] at the end of the month and to [place] in June.  Subjective:   1. Hypothyroidism, unspecified type Reviewed 06/03/2020 thyroid panel. Results within normal limits. [name] Park is on Synthroid 50 mcg QD and has been on this dose for over 2 years. She denies excessive fatigue and is still experiencing hair loss. She is followed by Dr. Talmage Nap at endocrinology.  2. Hair loss Reviewed 06/03/2020 thyroid panel. Results within normal limits. She is still experiencing hair loss.  Assessment/Plan:   1. Hypothyroidism, unspecified type Patient with long-standing hypothyroidism, on levothyroxine therapy. She appears euthyroid. Orders and follow up as documented in patient record. Continue Synthroid and regular follow ups with Dr. Talmage Nap.  Counseling . Good thyroid control is important for overall health. Supratherapeutic thyroid levels are dangerous and will not improve weight loss results. . The correct way to take levothyroxine is fasting, with water, separated by at least 30 minutes from breakfast, and separated by more than 4 hours from calcium, iron,  multivitamins, acid reflux medications (PPIs).   2. Hair loss Follow up with dermatology as needed.  3. Obesity with current BMI 27.3  [name] Park is currently in the action stage of change. As such, her goal is to continue with weight loss efforts. She has agreed to keeping a food journal and adhering to recommended goals of 1300 calories and 75 g protein.   Keep sugar/sweet intake to <150-200 cal per day. Check fasting labs at next OV.  Exercise goals: As is  Behavioral modification strategies: increasing lean protein intake, decreasing simple carbohydrates, meal planning and cooking strategies, travel eating strategies, planning for success and keeping a strict food journal.  [name] Park has agreed to follow-up with our clinic fasting in 2 weeks. She was informed of the importance of frequent follow-up visits to maximize her success with intensive lifestyle modifications for her multiple health conditions.   Objective:   Blood pressure 106/69, pulse (!) 101, temperature 98 F (36.7 C), height 5\' 2"  (1.575 m), weight 149 lb (67.6 kg), SpO2 97 %. Body mass index is 27.25 kg/m.  General: Cooperative, alert, well developed, in no acute distress. HEENT: Conjunctivae and lids unremarkable. Cardiovascular: Regular rhythm.  Lungs: Normal work of breathing. Neurologic: No focal deficits.   Lab Results  Component Value Date   CREATININE 0.82 04/21/2020   BUN 11 04/21/2020   NA 137 04/21/2020   K 4.6 04/21/2020   CL 100 04/21/2020   CO2 21 04/21/2020   Lab Results  Component Value Date   ALT 11 04/21/2020   AST 14 04/21/2020   ALKPHOS 77 04/21/2020   BILITOT 0.2 04/21/2020   Lab Results  Component Value Date   HGBA1C 5.4 04/21/2020   HGBA1C 5.6 01/14/2020   HGBA1C 6.0 (H) 10/02/2019   Lab Results  Component Value Date   INSULIN 18.7 04/21/2020   INSULIN 28.3 (H) 01/14/2020   INSULIN 69.5 (H) 10/02/2019   Lab Results  Component Value Date   TSH 1.850 06/03/2020   Lab  Results  Component Value Date   CHOL 181 04/21/2020   HDL 44 04/21/2020   LDLCALC 91 04/21/2020   TRIG 273 (H) 04/21/2020   CHOLHDL 4.1 04/21/2020   Lab Results  Component Value Date   WBC 8.1 10/02/2019   HGB 13.3 10/02/2019   HCT 39.1 10/02/2019   MCV 89 10/02/2019   PLT 306 10/02/2019    Attestation Statements:   Reviewed by clinician on day of visit: allergies, medications, problem list, medical history, surgical history, family history, social history, and previous encounter notes.  Time spent on visit including pre-visit chart review and post-visit care and charting was 30 minutes.   Edmund Hilda, am acting as Energy manager for William Hamburger, NP.  I have reviewed the above documentation for accuracy and completeness, and I agree with the above. -  Maika Mcelveen d. Katey Barrie, NP-C

## 2020-07-08 DIAGNOSIS — F411 Generalized anxiety disorder: Secondary | ICD-10-CM | POA: Diagnosis not present

## 2020-07-13 DIAGNOSIS — J3081 Allergic rhinitis due to animal (cat) (dog) hair and dander: Secondary | ICD-10-CM | POA: Diagnosis not present

## 2020-07-13 DIAGNOSIS — J301 Allergic rhinitis due to pollen: Secondary | ICD-10-CM | POA: Diagnosis not present

## 2020-07-13 DIAGNOSIS — J3089 Other allergic rhinitis: Secondary | ICD-10-CM | POA: Diagnosis not present

## 2020-07-15 DIAGNOSIS — J3089 Other allergic rhinitis: Secondary | ICD-10-CM | POA: Diagnosis not present

## 2020-07-15 DIAGNOSIS — J301 Allergic rhinitis due to pollen: Secondary | ICD-10-CM | POA: Diagnosis not present

## 2020-07-15 DIAGNOSIS — J3081 Allergic rhinitis due to animal (cat) (dog) hair and dander: Secondary | ICD-10-CM | POA: Diagnosis not present

## 2020-07-20 ENCOUNTER — Other Ambulatory Visit (INDEPENDENT_AMBULATORY_CARE_PROVIDER_SITE_OTHER): Payer: Self-pay | Admitting: Adult Health

## 2020-07-20 DIAGNOSIS — J301 Allergic rhinitis due to pollen: Secondary | ICD-10-CM | POA: Diagnosis not present

## 2020-07-20 DIAGNOSIS — E781 Pure hyperglyceridemia: Secondary | ICD-10-CM

## 2020-07-20 DIAGNOSIS — J3081 Allergic rhinitis due to animal (cat) (dog) hair and dander: Secondary | ICD-10-CM | POA: Diagnosis not present

## 2020-07-20 DIAGNOSIS — J3089 Other allergic rhinitis: Secondary | ICD-10-CM | POA: Diagnosis not present

## 2020-07-20 NOTE — Telephone Encounter (Signed)
Refill request

## 2020-07-22 ENCOUNTER — Other Ambulatory Visit: Payer: Self-pay

## 2020-07-22 ENCOUNTER — Ambulatory Visit (INDEPENDENT_AMBULATORY_CARE_PROVIDER_SITE_OTHER): Payer: BC Managed Care – PPO | Admitting: Physician Assistant

## 2020-07-22 ENCOUNTER — Encounter (INDEPENDENT_AMBULATORY_CARE_PROVIDER_SITE_OTHER): Payer: Self-pay | Admitting: Physician Assistant

## 2020-07-22 VITALS — BP 106/71 | HR 90 | Temp 97.5°F | Ht 62.0 in | Wt 145.0 lb

## 2020-07-22 DIAGNOSIS — Z9189 Other specified personal risk factors, not elsewhere classified: Secondary | ICD-10-CM | POA: Diagnosis not present

## 2020-07-22 DIAGNOSIS — E559 Vitamin D deficiency, unspecified: Secondary | ICD-10-CM | POA: Diagnosis not present

## 2020-07-22 DIAGNOSIS — J3089 Other allergic rhinitis: Secondary | ICD-10-CM | POA: Diagnosis not present

## 2020-07-22 DIAGNOSIS — Z6832 Body mass index (BMI) 32.0-32.9, adult: Secondary | ICD-10-CM

## 2020-07-22 DIAGNOSIS — E7849 Other hyperlipidemia: Secondary | ICD-10-CM | POA: Diagnosis not present

## 2020-07-22 DIAGNOSIS — E669 Obesity, unspecified: Secondary | ICD-10-CM | POA: Diagnosis not present

## 2020-07-22 DIAGNOSIS — R7303 Prediabetes: Secondary | ICD-10-CM | POA: Diagnosis not present

## 2020-07-22 DIAGNOSIS — J301 Allergic rhinitis due to pollen: Secondary | ICD-10-CM | POA: Diagnosis not present

## 2020-07-22 DIAGNOSIS — J3081 Allergic rhinitis due to animal (cat) (dog) hair and dander: Secondary | ICD-10-CM | POA: Diagnosis not present

## 2020-07-22 NOTE — Progress Notes (Signed)
Chief Complaint:   OBESITY [name] Park is here to discuss her progress with her obesity treatment plan along with follow-up of her obesity related diagnoses. [name] Park is on keeping a food journal and adhering to recommended goals of 1300 calories and 75 grams of protein daily and states she is following her eating plan approximately 75-80% of the time. [name] Park states she is walking for 15-30 minutes 1-3 times per week.  Today's visit was #: 16 Starting weight: 177 lbs Starting date: 10/02/2019 Today's weight: 145 lbs Today's date: 07/22/2020 Total lbs lost to date: 32 Total lbs lost since last in-office visit: 4  Interim History: [name] Park did a good job with weight loss. She is averaging 1500 calories, and 60-75 grams of protein daily. She is walking but she does not have a desire to perform resistance training. Her goal is to get to 140 lbs.  Subjective:   1. Pre-diabetes [name] Park is on metformin, and she is tolerating it well. Last A1c was 5.4.  2. Other hyperlipidemia [name] Park is on Vascepa, and she denies chest pain. She is walking during the week.  3. Vitamin D deficiency [name] Park is on 2,000 units of OTC Vit D daily. Last Vit D level was not at goal.  4. At risk for diabetes mellitus [name] Park is at higher than average risk for developing diabetes due to obesity.   Assessment/Plan:   1. Pre-diabetes [name] Park will continue metformin, and will continue to work on weight loss, exercise, and decreasing simple carbohydrates to help decrease the risk of diabetes. We will check labs today.   - Comprehensive metabolic panel - Hemoglobin A1c - Insulin, random  2. Other hyperlipidemia Cardiovascular risk and specific lipid/LDL goals reviewed. We discussed several lifestyle modifications today. We will check labs today. [name] Park will continue Vascepa, and will continue to work on diet, exercise and weight loss efforts. Orders and follow up as documented in patient record.   Counseling Intensive  lifestyle modifications are the first line treatment for this issue. . Dietary changes: Increase soluble fiber. Decrease simple carbohydrates. . Exercise changes: Moderate to vigorous-intensity aerobic activity 150 minutes per week if tolerated. . Lipid-lowering medications: see documented in medical record.  - Lipid panel  3. Vitamin D deficiency Low Vitamin D level contributes to fatigue and are associated with obesity, breast, and colon cancer. We will check labs today. [name] Park will continue taking OTC Vitamin D daily and will follow-up for routine testing of Vitamin D, at least 2-3 times per year to avoid over-replacement.  - VITAMIN D 25 Hydroxy (Vit-D Deficiency, Fractures)  4. At risk for diabetes mellitus [name] Park was given approximately 15 minutes of diabetes education and counseling today. We discussed intensive lifestyle modifications today with an emphasis on weight loss as well as increasing exercise and decreasing simple carbohydrates in her diet. We also reviewed medication options with an emphasis on risk versus benefit of those discussed.   Repetitive spaced learning was employed today to elicit superior memory formation and behavioral change.  5. Class 1 obesity without serious comorbidity with body mass index (BMI) of 32.0 to 32.9 in adult, unspecified obesity type, current bmi 26.5 [name] Park is currently in the action stage of change. As such, her goal is to continue with weight loss efforts. She has agreed to keeping a food journal and adhering to recommended goals of 1300 calories and 75 grams of protein daily.   Exercise goals: As is.  Behavioral modification strategies: increasing lean protein intake and meal planning and cooking  strategies.  [name] Park has agreed to follow-up with our clinic in 2 weeks. She was informed of the importance of frequent follow-up visits to maximize her success with intensive lifestyle modifications for her multiple health conditions.   [name] Park was  informed we would discuss her lab results at her next visit unless there is a critical issue that needs to be addressed sooner. [name] Park agreed to keep her next visit at the agreed upon time to discuss these results.  Objective:   Blood pressure 106/71, pulse 90, temperature (!) 97.5 F (36.4 C), temperature source Oral, height 5\' 2"  (1.575 m), weight 145 lb (65.8 kg), SpO2 98 %. Body mass index is 26.52 kg/m.  General: Cooperative, alert, well developed, in no acute distress. HEENT: Conjunctivae and lids unremarkable. Cardiovascular: Regular rhythm.  Lungs: Normal work of breathing. Neurologic: No focal deficits.   Lab Results  Component Value Date   CREATININE 0.82 04/21/2020   BUN 11 04/21/2020   NA 137 04/21/2020   K 4.6 04/21/2020   CL 100 04/21/2020   CO2 21 04/21/2020   Lab Results  Component Value Date   ALT 11 04/21/2020   AST 14 04/21/2020   ALKPHOS 77 04/21/2020   BILITOT 0.2 04/21/2020   Lab Results  Component Value Date   HGBA1C 5.4 04/21/2020   HGBA1C 5.6 01/14/2020   HGBA1C 6.0 (H) 10/02/2019   Lab Results  Component Value Date   INSULIN 18.7 04/21/2020   INSULIN 28.3 (H) 01/14/2020   INSULIN 69.5 (H) 10/02/2019   Lab Results  Component Value Date   TSH 1.850 06/03/2020   Lab Results  Component Value Date   CHOL 181 04/21/2020   HDL 44 04/21/2020   LDLCALC 91 04/21/2020   TRIG 273 (H) 04/21/2020   CHOLHDL 4.1 04/21/2020   Lab Results  Component Value Date   WBC 8.1 10/02/2019   HGB 13.3 10/02/2019   HCT 39.1 10/02/2019   MCV 89 10/02/2019   PLT 306 10/02/2019   No results found for: IRON, TIBC, FERRITIN  Attestation Statements:   Reviewed by clinician on day of visit: allergies, medications, problem list, medical history, surgical history, family history, social history, and previous encounter notes.   10/04/2019, am acting as transcriptionist for Trude Mcburney, PA-C.  I have reviewed the above documentation for accuracy and  completeness, and I agree with the above. [employer], PA-C

## 2020-07-23 LAB — COMPREHENSIVE METABOLIC PANEL
ALT: 9 IU/L (ref 0–32)
AST: 13 IU/L (ref 0–40)
Albumin/Globulin Ratio: 2 (ref 1.2–2.2)
Albumin: 4.3 g/dL (ref 3.8–4.8)
Alkaline Phosphatase: 73 IU/L (ref 44–121)
BUN/Creatinine Ratio: 17 (ref 9–23)
BUN: 14 mg/dL (ref 6–24)
Bilirubin Total: 0.3 mg/dL (ref 0.0–1.2)
CO2: 23 mmol/L (ref 20–29)
Calcium: 9.7 mg/dL (ref 8.7–10.2)
Chloride: 100 mmol/L (ref 96–106)
Creatinine, Ser: 0.82 mg/dL (ref 0.57–1.00)
Globulin, Total: 2.2 g/dL (ref 1.5–4.5)
Glucose: 84 mg/dL (ref 65–99)
Potassium: 4.6 mmol/L (ref 3.5–5.2)
Sodium: 140 mmol/L (ref 134–144)
Total Protein: 6.5 g/dL (ref 6.0–8.5)
eGFR: 91 mL/min/{1.73_m2} (ref >59)

## 2020-07-23 LAB — VITAMIN D 25 HYDROXY (VIT D DEFICIENCY, FRACTURES): Vit D, 25-Hydroxy: 59.7 ng/mL (ref 30.0–100.0)

## 2020-07-23 LAB — INSULIN, RANDOM: INSULIN: 16.3 u[IU]/mL (ref 2.6–24.9)

## 2020-07-23 LAB — LIPID PANEL
Chol/HDL Ratio: 3.8 ratio (ref 0.0–4.4)
Cholesterol, Total: 203 mg/dL — ABNORMAL HIGH (ref 100–199)
HDL: 54 mg/dL (ref >39)
LDL Chol Calc (NIH): 109 mg/dL — ABNORMAL HIGH (ref 0–99)
Triglycerides: 235 mg/dL — ABNORMAL HIGH (ref 0–149)
VLDL Cholesterol Cal: 40 mg/dL (ref 5–40)

## 2020-07-23 LAB — HEMOGLOBIN A1C
Est. average glucose Bld gHb Est-mCnc: 111 mg/dL
Hgb A1c MFr Bld: 5.5 % (ref 4.8–5.6)

## 2020-08-04 DIAGNOSIS — J3081 Allergic rhinitis due to animal (cat) (dog) hair and dander: Secondary | ICD-10-CM | POA: Diagnosis not present

## 2020-08-04 DIAGNOSIS — J3089 Other allergic rhinitis: Secondary | ICD-10-CM | POA: Diagnosis not present

## 2020-08-04 DIAGNOSIS — J301 Allergic rhinitis due to pollen: Secondary | ICD-10-CM | POA: Diagnosis not present

## 2020-08-05 DIAGNOSIS — F411 Generalized anxiety disorder: Secondary | ICD-10-CM | POA: Diagnosis not present

## 2020-08-06 DIAGNOSIS — G43719 Chronic migraine without aura, intractable, without status migrainosus: Secondary | ICD-10-CM | POA: Diagnosis not present

## 2020-08-06 DIAGNOSIS — M542 Cervicalgia: Secondary | ICD-10-CM | POA: Diagnosis not present

## 2020-08-06 DIAGNOSIS — J301 Allergic rhinitis due to pollen: Secondary | ICD-10-CM | POA: Diagnosis not present

## 2020-08-06 DIAGNOSIS — J3089 Other allergic rhinitis: Secondary | ICD-10-CM | POA: Diagnosis not present

## 2020-08-06 DIAGNOSIS — J3081 Allergic rhinitis due to animal (cat) (dog) hair and dander: Secondary | ICD-10-CM | POA: Diagnosis not present

## 2020-08-11 DIAGNOSIS — J301 Allergic rhinitis due to pollen: Secondary | ICD-10-CM | POA: Diagnosis not present

## 2020-08-11 DIAGNOSIS — J3081 Allergic rhinitis due to animal (cat) (dog) hair and dander: Secondary | ICD-10-CM | POA: Diagnosis not present

## 2020-08-11 DIAGNOSIS — J3089 Other allergic rhinitis: Secondary | ICD-10-CM | POA: Diagnosis not present

## 2020-08-11 DIAGNOSIS — E785 Hyperlipidemia, unspecified: Secondary | ICD-10-CM | POA: Diagnosis not present

## 2020-08-11 DIAGNOSIS — E039 Hypothyroidism, unspecified: Secondary | ICD-10-CM | POA: Diagnosis not present

## 2020-08-11 DIAGNOSIS — R7301 Impaired fasting glucose: Secondary | ICD-10-CM | POA: Diagnosis not present

## 2020-08-24 DIAGNOSIS — E669 Obesity, unspecified: Secondary | ICD-10-CM | POA: Diagnosis not present

## 2020-08-24 DIAGNOSIS — E039 Hypothyroidism, unspecified: Secondary | ICD-10-CM | POA: Diagnosis not present

## 2020-08-24 DIAGNOSIS — R7301 Impaired fasting glucose: Secondary | ICD-10-CM | POA: Diagnosis not present

## 2020-08-24 DIAGNOSIS — J301 Allergic rhinitis due to pollen: Secondary | ICD-10-CM | POA: Diagnosis not present

## 2020-08-24 DIAGNOSIS — J3081 Allergic rhinitis due to animal (cat) (dog) hair and dander: Secondary | ICD-10-CM | POA: Diagnosis not present

## 2020-08-24 DIAGNOSIS — J3089 Other allergic rhinitis: Secondary | ICD-10-CM | POA: Diagnosis not present

## 2020-08-24 DIAGNOSIS — E282 Polycystic ovarian syndrome: Secondary | ICD-10-CM | POA: Diagnosis not present

## 2020-08-25 ENCOUNTER — Other Ambulatory Visit: Payer: Self-pay

## 2020-08-25 ENCOUNTER — Ambulatory Visit (INDEPENDENT_AMBULATORY_CARE_PROVIDER_SITE_OTHER): Payer: BC Managed Care – PPO | Admitting: Adult Health

## 2020-08-25 ENCOUNTER — Encounter (INDEPENDENT_AMBULATORY_CARE_PROVIDER_SITE_OTHER): Payer: Self-pay | Admitting: Adult Health

## 2020-08-25 VITALS — BP 112/73 | HR 106 | Temp 97.8°F | Ht 62.0 in | Wt 150.0 lb

## 2020-08-25 DIAGNOSIS — E781 Pure hyperglyceridemia: Secondary | ICD-10-CM | POA: Diagnosis not present

## 2020-08-25 DIAGNOSIS — Z6830 Body mass index (BMI) 30.0-30.9, adult: Secondary | ICD-10-CM

## 2020-08-25 DIAGNOSIS — E559 Vitamin D deficiency, unspecified: Secondary | ICD-10-CM | POA: Diagnosis not present

## 2020-08-25 DIAGNOSIS — Z9189 Other specified personal risk factors, not elsewhere classified: Secondary | ICD-10-CM | POA: Diagnosis not present

## 2020-08-25 DIAGNOSIS — E669 Obesity, unspecified: Secondary | ICD-10-CM | POA: Diagnosis not present

## 2020-08-25 DIAGNOSIS — R7303 Prediabetes: Secondary | ICD-10-CM | POA: Diagnosis not present

## 2020-08-25 MED ORDER — VASCEPA 1 G PO CAPS: ORAL_CAPSULE | ORAL | 2 refills | Status: DC

## 2020-08-25 NOTE — Progress Notes (Signed)
Chief Complaint:   OBESITY [name] Park is here to discuss her progress with her obesity treatment plan along with follow-up of her obesity related diagnoses. [name] Park is on keeping a food journal and adhering to recommended goals of 1300 calories and 75 g protein and states she is following her eating plan approximately 100% of the time. [name] Park states she is walking 15-30 minutes 2-3 times per week.  Today's visit was #: 17 Starting weight: 177 lbs Starting date: 10/02/2019 Today's weight: 150 lbs Today's date: 08/25/2020 Total lbs lost to date: 27 Total lbs lost since last in-office visit: 0  Interim History: [name] Park recently traveled to [facility] for 1 week.  She also traveled to [place] for 2 days [facility]). She continued to track intake 100%- average daily intake of 1500-1800 cal and 65-70 g protein.  Subjective:   1. Hypertriglyceridemia Discussed labs with patient today. 07/22/2020 lipid panel revealed slight increase in total and LDL, improved triglycerides, and excellent HDL. ASCVD risk score 0.6%. [name] Park is on Vascepa 1 g- 2 caps BID.  Lab Results  Component Value Date   CHOL 203 (H) 07/22/2020   HDL 54 07/22/2020   LDLCALC 109 (H) 07/22/2020   TRIG 235 (H) 07/22/2020   CHOLHDL 3.8 07/22/2020   Lab Results  Component Value Date   ALT 9 07/22/2020   AST 13 07/22/2020   ALKPHOS 73 07/22/2020   BILITOT 0.3 07/22/2020   The 10-year ASCVD risk score Denman George DC Jr., et al., 2013) is: 0.6%   Values used to calculate the score:     Age: 45 years     Sex: Female     Is Non-Hispanic African American: No     Diabetic: No     Tobacco smoker: No     Systolic Blood Pressure: 112 mmHg     Is BP treated: No     HDL Cholesterol: 54 mg/dL     Total Cholesterol: 203 mg/dL  2. Vitamin D deficiency Discussed labs with patient today. [name] Park's Vitamin D level was 59.7 on 07/22/2020- at goal. She is currently taking OTC vitamin D 2,000 IU each day. She denies nausea, vomiting or muscle  weakness.  3. Pre-diabetes Discussed labs with patient today. 07/22/2020 BG 84, A1c 5.5, and insulin level 16.3- all improved. [name] Park's insurance would not cover GLP-1. She converted to Metformin 500 mg- 2 tabs BID.  Lab Results  Component Value Date   HGBA1C 5.5 07/22/2020   Lab Results  Component Value Date   INSULIN 16.3 07/22/2020   INSULIN 18.7 04/21/2020   INSULIN 28.3 (H) 01/14/2020   INSULIN 69.5 (H) 10/02/2019   4. At risk for diabetes mellitus [name] Park is at higher than average risk for developing diabetes due to obesity and pre-diabetes.   Assessment/Plan:   1. Hypertriglyceridemia Cardiovascular risk and specific lipid/LDL goals reviewed.  We discussed several lifestyle modifications today and Nickayla will continue to work on diet, exercise and weight loss efforts. Orders and follow up as documented in patient record.   Counseling Intensive lifestyle modifications are the first line treatment for this issue. Dietary changes: Increase soluble fiber. Decrease simple carbohydrates. Exercise changes: Moderate to vigorous-intensity aerobic activity 150 minutes per week if tolerated. Lipid-lowering medications: see documented in medical record.  - VASCEPA 1 g capsule; TAKE TWO CAPSULES TWICE DAILY  Dispense: 120 capsule; Refill: 2  2. Vitamin D deficiency Low Vitamin D level contributes to fatigue and are associated with obesity, breast, and colon cancer. She agrees to  continue to take OTC  Vitamin D @2 ,000 IU QD and will follow-up for routine testing of Vitamin D, at least 2-3 times per year to avoid over-replacement.  3. Pre-diabetes [name] Park will continue to work on weight loss, exercise, and decreasing simple carbohydrates to help decrease the risk of diabetes.  Continue Metformin and regular exercise.  4. At risk for diabetes mellitus [name] Park was given approximately 15 minutes of diabetes education and counseling today. We discussed intensive lifestyle modifications  today with an emphasis on weight loss as well as increasing exercise and decreasing simple carbohydrates in her diet. We also reviewed medication options with an emphasis on risk versus benefit of those discussed.   Repetitive spaced learning was employed today to elicit superior memory formation and behavioral change.  5. Obesity with current BMI 27.6  [name] Park is currently in the action stage of change. As such, her goal is to continue with weight loss efforts. She has agreed to keeping a food journal and adhering to recommended goals of 1300 calories and 75 g protein.   Check IC at next OV- pt is aware to arrive 30 minutes prior to OV.  Exercise goals:  As is  Behavioral modification strategies: increasing lean protein intake, decreasing simple carbohydrates, meal planning and cooking strategies, keeping healthy foods in the home, planning for success, and keeping a strict food journal.  [name] Park has agreed to follow-up with our clinic in 4 weeks. She was informed of the importance of frequent follow-up visits to maximize her success with intensive lifestyle modifications for her multiple health conditions.   Objective:   Blood pressure 112/73, pulse (!) 106, temperature 97.8 F (36.6 C), height 5\' 2"  (1.575 m), weight 150 lb (68 kg), SpO2 99 %. Body mass index is 27.44 kg/m.  General: Cooperative, alert, well developed, in no acute distress. HEENT: Conjunctivae and lids unremarkable. Cardiovascular: Regular rhythm.  Lungs: Normal work of breathing. Neurologic: No focal deficits.   Lab Results  Component Value Date   CREATININE 0.82 07/22/2020   BUN 14 07/22/2020   NA 140 07/22/2020   K 4.6 07/22/2020   CL 100 07/22/2020   CO2 23 07/22/2020   Lab Results  Component Value Date   ALT 9 07/22/2020   AST 13 07/22/2020   ALKPHOS 73 07/22/2020   BILITOT 0.3 07/22/2020   Lab Results  Component Value Date   HGBA1C 5.5 07/22/2020   HGBA1C 5.4 04/21/2020   HGBA1C 5.6 01/14/2020    HGBA1C 6.0 (H) 10/02/2019   Lab Results  Component Value Date   INSULIN 16.3 07/22/2020   INSULIN 18.7 04/21/2020   INSULIN 28.3 (H) 01/14/2020   INSULIN 69.5 (H) 10/02/2019   Lab Results  Component Value Date   TSH 1.850 06/03/2020   Lab Results  Component Value Date   CHOL 203 (H) 07/22/2020   HDL 54 07/22/2020   LDLCALC 109 (H) 07/22/2020   TRIG 235 (H) 07/22/2020   CHOLHDL 3.8 07/22/2020   Lab Results  Component Value Date   WBC 8.1 10/02/2019   HGB 13.3 10/02/2019   HCT 39.1 10/02/2019   MCV 89 10/02/2019   PLT 306 10/02/2019   No results found for: IRON, TIBC, FERRITIN  Attestation Statements:   Reviewed by clinician on day of visit: allergies, medications, problem list, medical history, surgical history, family history, social history, and previous encounter notes.  10/04/2019, CMA, am acting as transcriptionist for 10/04/2019, NP.  I have reviewed the above documentation for accuracy and  completeness, and I agree with the above. -  Elpidio Thielen d. Gerard Cantara, NP-C

## 2020-09-03 DIAGNOSIS — F411 Generalized anxiety disorder: Secondary | ICD-10-CM | POA: Diagnosis not present

## 2020-09-08 DIAGNOSIS — J3089 Other allergic rhinitis: Secondary | ICD-10-CM | POA: Diagnosis not present

## 2020-09-08 DIAGNOSIS — J301 Allergic rhinitis due to pollen: Secondary | ICD-10-CM | POA: Diagnosis not present

## 2020-09-08 DIAGNOSIS — J3081 Allergic rhinitis due to animal (cat) (dog) hair and dander: Secondary | ICD-10-CM | POA: Diagnosis not present

## 2020-09-17 DIAGNOSIS — G43719 Chronic migraine without aura, intractable, without status migrainosus: Secondary | ICD-10-CM | POA: Diagnosis not present

## 2020-09-17 DIAGNOSIS — M542 Cervicalgia: Secondary | ICD-10-CM | POA: Diagnosis not present

## 2020-09-20 ENCOUNTER — Other Ambulatory Visit: Payer: Self-pay

## 2020-09-20 ENCOUNTER — Encounter (INDEPENDENT_AMBULATORY_CARE_PROVIDER_SITE_OTHER): Payer: Self-pay | Admitting: Physician Assistant

## 2020-09-20 ENCOUNTER — Ambulatory Visit (INDEPENDENT_AMBULATORY_CARE_PROVIDER_SITE_OTHER): Payer: BC Managed Care – PPO | Admitting: Physician Assistant

## 2020-09-20 VITALS — BP 95/65 | HR 112 | Temp 97.7°F | Ht 62.0 in | Wt 149.0 lb

## 2020-09-20 DIAGNOSIS — E669 Obesity, unspecified: Secondary | ICD-10-CM | POA: Diagnosis not present

## 2020-09-20 DIAGNOSIS — Z6831 Body mass index (BMI) 31.0-31.9, adult: Secondary | ICD-10-CM | POA: Diagnosis not present

## 2020-09-20 DIAGNOSIS — R0602 Shortness of breath: Secondary | ICD-10-CM | POA: Diagnosis not present

## 2020-09-20 DIAGNOSIS — Z9189 Other specified personal risk factors, not elsewhere classified: Secondary | ICD-10-CM

## 2020-09-20 DIAGNOSIS — E7849 Other hyperlipidemia: Secondary | ICD-10-CM

## 2020-09-20 DIAGNOSIS — E781 Pure hyperglyceridemia: Secondary | ICD-10-CM

## 2020-09-20 MED ORDER — VASCEPA 1 G PO CAPS: ORAL_CAPSULE | ORAL | 2 refills | Status: DC

## 2020-09-28 DIAGNOSIS — J3089 Other allergic rhinitis: Secondary | ICD-10-CM | POA: Diagnosis not present

## 2020-09-28 DIAGNOSIS — E282 Polycystic ovarian syndrome: Secondary | ICD-10-CM | POA: Diagnosis not present

## 2020-09-28 DIAGNOSIS — J3081 Allergic rhinitis due to animal (cat) (dog) hair and dander: Secondary | ICD-10-CM | POA: Diagnosis not present

## 2020-09-28 DIAGNOSIS — Z1231 Encounter for screening mammogram for malignant neoplasm of breast: Secondary | ICD-10-CM | POA: Diagnosis not present

## 2020-09-28 DIAGNOSIS — Z01419 Encounter for gynecological examination (general) (routine) without abnormal findings: Secondary | ICD-10-CM | POA: Diagnosis not present

## 2020-09-28 DIAGNOSIS — J301 Allergic rhinitis due to pollen: Secondary | ICD-10-CM | POA: Diagnosis not present

## 2020-09-28 DIAGNOSIS — Z6828 Body mass index (BMI) 28.0-28.9, adult: Secondary | ICD-10-CM | POA: Diagnosis not present

## 2020-09-28 NOTE — Progress Notes (Signed)
Chief Complaint:   OBESITY [name] Park is here to discuss her progress with her obesity treatment plan along with follow-up of her obesity related diagnoses. [name] Park is on keeping a food journal and adhering to recommended goals of 1300 calories and 75 grams of protein daily and states she is following her eating plan approximately 85-90 % of the time. [name] Park states she is walking for 15-30 minutes 3 times per week.  Today's visit was #: 18 Starting weight: 177 lbs Starting date: 10/02/2019 Today's weight: 149 lbs Today's date: 09/20/2020 Total lbs lost to date: 28 Total lbs lost since last in-office visit: 1  Interim History: [name] Park has been back from vacation for 1 month and she has had trouble getting fully back on track with her plan. She is getting up to 1500-1600 calories and averaging 65-75 grams of protein daily.  Subjective:   1. Other hyperlipidemia [name] Park is on Vasepa. Her last triglycerides have improved but is not at goal.  2. SOB (shortness of breath) on exertion [name] Park notes shortness of breath with exertion. She denies dizziness or lightheadedness.  3. At risk for heart disease [name] Park is at a higher than average risk for cardiovascular disease due to obesity.   Assessment/Plan:   1. Other hyperlipidemia Cardiovascular risk and specific lipid/LDL goals reviewed. We discussed several lifestyle modifications today. We will refill Vasepa for 2 months with 2 refills. [name] Park will continue to work on diet, exercise and weight loss efforts. Orders and follow up as documented in patient record.   Counseling Intensive lifestyle modifications are the first line treatment for this issue. Dietary changes: Increase soluble fiber. Decrease simple carbohydrates. Exercise changes: Moderate to vigorous-intensity aerobic activity 150 minutes per week if tolerated. Lipid-lowering medications: see documented in medical record.  - VASCEPA 1 g capsule; TAKE TWO CAPSULES TWICE DAILY   Dispense: 120 capsule; Refill: 2  2. SOB (shortness of breath) on exertion IC was done today. [name] Park has agreed to work on weight loss and gradually increase exercise to treat her exercise induced shortness of breath. Will continue to monitor closely.  3. At risk for heart disease [name] Park was given approximately 15 minutes of coronary artery disease prevention counseling today. She is 45 y.o. female and has risk factors for heart disease including obesity. We discussed intensive lifestyle modifications today with an emphasis on specific weight loss instructions and strategies.   Repetitive spaced learning was employed today to elicit superior memory formation and behavioral change.  4. Current BMI 27.25 [name] Park is currently in the action stage of change. As such, her goal is to continue with weight loss efforts. She has agreed to keeping a food journal and adhering to recommended goals of 1300-1400 calories and 85 grams of protein daily.   Exercise goals: As is.  Behavioral modification strategies: meal planning and cooking strategies and keeping healthy foods in the home.  [name] Park has agreed to follow-up with our clinic in 2 to 3 weeks. She was informed of the importance of frequent follow-up visits to maximize her success with intensive lifestyle modifications for her multiple health conditions.   Objective:   Blood pressure 95/65, pulse (!) 112, temperature 97.7 F (36.5 C), height 5\' 2"  (1.575 m), weight 149 lb (67.6 kg), SpO2 97 %. Body mass index is 27.25 kg/m.  General: Cooperative, alert, well developed, in no acute distress. HEENT: Conjunctivae and lids unremarkable. Cardiovascular: Regular rhythm.  Lungs: Normal work of breathing. Neurologic: No focal deficits.   Lab Results  Component  Value Date   CREATININE 0.82 07/22/2020   BUN 14 07/22/2020   NA 140 07/22/2020   K 4.6 07/22/2020   CL 100 07/22/2020   CO2 23 07/22/2020   Lab Results  Component Value Date   ALT 9  07/22/2020   AST 13 07/22/2020   ALKPHOS 73 07/22/2020   BILITOT 0.3 07/22/2020   Lab Results  Component Value Date   HGBA1C 5.5 07/22/2020   HGBA1C 5.4 04/21/2020   HGBA1C 5.6 01/14/2020   HGBA1C 6.0 (H) 10/02/2019   Lab Results  Component Value Date   INSULIN 16.3 07/22/2020   INSULIN 18.7 04/21/2020   INSULIN 28.3 (H) 01/14/2020   INSULIN 69.5 (H) 10/02/2019   Lab Results  Component Value Date   TSH 1.850 06/03/2020   Lab Results  Component Value Date   CHOL 203 (H) 07/22/2020   HDL 54 07/22/2020   LDLCALC 109 (H) 07/22/2020   TRIG 235 (H) 07/22/2020   CHOLHDL 3.8 07/22/2020   Lab Results  Component Value Date   VD25OH 59.7 07/22/2020   VD25OH 73.4 04/21/2020   VD25OH 55.9 01/14/2020   Lab Results  Component Value Date   WBC 8.1 10/02/2019   HGB 13.3 10/02/2019   HCT 39.1 10/02/2019   MCV 89 10/02/2019   PLT 306 10/02/2019   No results found for: IRON, TIBC, FERRITIN  Attestation Statements:   Reviewed by clinician on day of visit: allergies, medications, problem list, medical history, surgical history, family history, social history, and previous encounter notes.   Trude Mcburney, am acting as transcriptionist for [employer], PA-C.  I have reviewed the above documentation for accuracy and completeness, and I agree with the above. Alois Cliche, PA-C

## 2020-09-29 DIAGNOSIS — F3189 Other bipolar disorder: Secondary | ICD-10-CM | POA: Diagnosis not present

## 2020-09-29 DIAGNOSIS — F1020 Alcohol dependence, uncomplicated: Secondary | ICD-10-CM | POA: Diagnosis not present

## 2020-09-30 DIAGNOSIS — F411 Generalized anxiety disorder: Secondary | ICD-10-CM | POA: Diagnosis not present

## 2020-10-05 DIAGNOSIS — J3089 Other allergic rhinitis: Secondary | ICD-10-CM | POA: Diagnosis not present

## 2020-10-05 DIAGNOSIS — J301 Allergic rhinitis due to pollen: Secondary | ICD-10-CM | POA: Diagnosis not present

## 2020-10-05 DIAGNOSIS — J3081 Allergic rhinitis due to animal (cat) (dog) hair and dander: Secondary | ICD-10-CM | POA: Diagnosis not present

## 2020-10-07 DIAGNOSIS — J3081 Allergic rhinitis due to animal (cat) (dog) hair and dander: Secondary | ICD-10-CM | POA: Diagnosis not present

## 2020-10-07 DIAGNOSIS — J301 Allergic rhinitis due to pollen: Secondary | ICD-10-CM | POA: Diagnosis not present

## 2020-10-07 DIAGNOSIS — J3089 Other allergic rhinitis: Secondary | ICD-10-CM | POA: Diagnosis not present

## 2020-10-12 ENCOUNTER — Ambulatory Visit (INDEPENDENT_AMBULATORY_CARE_PROVIDER_SITE_OTHER): Payer: BC Managed Care – PPO | Admitting: Adult Health

## 2020-10-12 ENCOUNTER — Other Ambulatory Visit: Payer: Self-pay

## 2020-10-12 ENCOUNTER — Encounter (INDEPENDENT_AMBULATORY_CARE_PROVIDER_SITE_OTHER): Payer: Self-pay | Admitting: Adult Health

## 2020-10-12 VITALS — BP 111/72 | HR 104 | Temp 98.0°F | Ht 62.0 in | Wt 150.0 lb

## 2020-10-12 DIAGNOSIS — E559 Vitamin D deficiency, unspecified: Secondary | ICD-10-CM

## 2020-10-12 DIAGNOSIS — E669 Obesity, unspecified: Secondary | ICD-10-CM | POA: Diagnosis not present

## 2020-10-12 DIAGNOSIS — L659 Nonscarring hair loss, unspecified: Secondary | ICD-10-CM

## 2020-10-12 DIAGNOSIS — J3081 Allergic rhinitis due to animal (cat) (dog) hair and dander: Secondary | ICD-10-CM | POA: Diagnosis not present

## 2020-10-12 DIAGNOSIS — J3089 Other allergic rhinitis: Secondary | ICD-10-CM | POA: Diagnosis not present

## 2020-10-12 DIAGNOSIS — Z6830 Body mass index (BMI) 30.0-30.9, adult: Secondary | ICD-10-CM

## 2020-10-12 DIAGNOSIS — J301 Allergic rhinitis due to pollen: Secondary | ICD-10-CM | POA: Diagnosis not present

## 2020-10-13 NOTE — Progress Notes (Signed)
Chief Complaint:   OBESITY [name] Park is here to discuss her progress with her obesity treatment plan along with follow-up of her obesity related diagnoses. [name] Park is on keeping a food journal and adhering to recommended goals of 1300-1400 calories and 85 grams of protein and states she is following her eating plan approximately 50% of the time. [name] Park states she is walking for 15-30 minutes 2-3 times per week.  Today's visit was #: 19 Starting weight: 177 lbs Starting date: 10/02/2019 Today's weight: 150 lbs Today's date: 10/12/2020 Total lbs lost to date: 27 lbs Total lbs lost since last in-office visit: 0  Interim History:  [name] Park has been consuming high protein oatmeal for breakfast and 5 ounces of protein at lunch.   Current weight is 150 pounds, goal weight 145 pounds.  Current BMI 27.6 Reviewed body composition information.    Subjective:   1. Hair loss Followed by Dr. Balan/Endocrinology - last OV June 2022 - recommended to take Hari/Skin/Nails, Biotin.   She feels that her hair loss has slowed.  2. Vitamin D deficiency Vitamin D level on 07/22/2020 - 59.7 - at goal.  She is currently taking OTC vitamin D 2,000 IU each day. She denies nausea, vomiting or muscle weakness.  Lab Results  Component Value Date   VD25OH 59.7 07/22/2020   VD25OH 73.4 04/21/2020   VD25OH 55.9 01/14/2020   Assessment/Plan:   1. Hair loss Continue daily supplement.   2. Vitamin D deficiency Continue OTC supplement - vitamin D 2,000 IU daily.   3. Obesity with current BMI 27.6  [name] Park is currently in the action stage of change. As such, her goal is to continue with weight loss efforts. She has agreed to keeping a food journal and adhering to recommended goals of 1300-1400 calories and 85 grams of protein.   Exercise goals:  As is.  Behavioral modification strategies: increasing lean protein intake, decreasing simple carbohydrates, meal planning and cooking strategies, keeping healthy foods  in the home, planning for success, and keeping a strict food journal.  At least 5 ounces of protein at lunch. Check fasting labs at next office visit.  [name] Park has agreed to follow-up with our clinic in 3 weeks, fasting. She was informed of the importance of frequent follow-up visits to maximize her success with intensive lifestyle modifications for her multiple health conditions.   Objective:   Blood pressure 111/72, pulse (!) 104, temperature 98 F (36.7 C), height 5\' 2"  (1.575 m), weight 150 lb (68 kg), SpO2 99 %. Body mass index is 27.44 kg/m.  General: Cooperative, alert, well developed, in no acute distress. HEENT: Conjunctivae and lids unremarkable. Cardiovascular: Regular rhythm.  Lungs: Normal work of breathing. Neurologic: No focal deficits.   Lab Results  Component Value Date   CREATININE 0.82 07/22/2020   BUN 14 07/22/2020   NA 140 07/22/2020   K 4.6 07/22/2020   CL 100 07/22/2020   CO2 23 07/22/2020   Lab Results  Component Value Date   ALT 9 07/22/2020   AST 13 07/22/2020   ALKPHOS 73 07/22/2020   BILITOT 0.3 07/22/2020   Lab Results  Component Value Date   HGBA1C 5.5 07/22/2020   HGBA1C 5.4 04/21/2020   HGBA1C 5.6 01/14/2020   HGBA1C 6.0 (H) 10/02/2019   Lab Results  Component Value Date   INSULIN 16.3 07/22/2020   INSULIN 18.7 04/21/2020   INSULIN 28.3 (H) 01/14/2020   INSULIN 69.5 (H) 10/02/2019   Lab Results  Component Value Date  TSH 1.850 06/03/2020   Lab Results  Component Value Date   CHOL 203 (H) 07/22/2020   HDL 54 07/22/2020   LDLCALC 109 (H) 07/22/2020   TRIG 235 (H) 07/22/2020   CHOLHDL 3.8 07/22/2020   Lab Results  Component Value Date   VD25OH 59.7 07/22/2020   VD25OH 73.4 04/21/2020   VD25OH 55.9 01/14/2020   Lab Results  Component Value Date   WBC 8.1 10/02/2019   HGB 13.3 10/02/2019   HCT 39.1 10/02/2019   MCV 89 10/02/2019   PLT 306 10/02/2019   Attestation Statements:   Reviewed by clinician on day of  visit: allergies, medications, problem list, medical history, surgical history, family history, social history, and previous encounter notes.  Time spent on visit including pre-visit chart review and post-visit care and charting was 30 minutes.   I, Insurance claims handler, CMA, am acting as Energy manager for William Hamburger, NP.  I have reviewed the above documentation for accuracy and completeness, and I agree with the above. -  Ayelen Sciortino d. Azuree Minish, NP-C

## 2020-10-14 DIAGNOSIS — J3089 Other allergic rhinitis: Secondary | ICD-10-CM | POA: Diagnosis not present

## 2020-10-14 DIAGNOSIS — J301 Allergic rhinitis due to pollen: Secondary | ICD-10-CM | POA: Diagnosis not present

## 2020-10-14 DIAGNOSIS — J3081 Allergic rhinitis due to animal (cat) (dog) hair and dander: Secondary | ICD-10-CM | POA: Diagnosis not present

## 2020-10-27 DIAGNOSIS — R059 Cough, unspecified: Secondary | ICD-10-CM | POA: Diagnosis not present

## 2020-10-27 DIAGNOSIS — J029 Acute pharyngitis, unspecified: Secondary | ICD-10-CM | POA: Diagnosis not present

## 2020-10-27 DIAGNOSIS — U071 COVID-19: Secondary | ICD-10-CM | POA: Diagnosis not present

## 2020-10-28 DIAGNOSIS — F411 Generalized anxiety disorder: Secondary | ICD-10-CM | POA: Diagnosis not present

## 2020-11-10 ENCOUNTER — Other Ambulatory Visit: Payer: Self-pay

## 2020-11-10 ENCOUNTER — Ambulatory Visit (INDEPENDENT_AMBULATORY_CARE_PROVIDER_SITE_OTHER): Payer: BC Managed Care – PPO | Admitting: Adult Health

## 2020-11-10 ENCOUNTER — Encounter (INDEPENDENT_AMBULATORY_CARE_PROVIDER_SITE_OTHER): Payer: Self-pay | Admitting: Adult Health

## 2020-11-10 VITALS — BP 105/72 | HR 98 | Temp 97.9°F | Ht 62.0 in | Wt 149.0 lb

## 2020-11-10 DIAGNOSIS — E669 Obesity, unspecified: Secondary | ICD-10-CM | POA: Diagnosis not present

## 2020-11-10 DIAGNOSIS — J0140 Acute pansinusitis, unspecified: Secondary | ICD-10-CM | POA: Diagnosis not present

## 2020-11-10 DIAGNOSIS — J3081 Allergic rhinitis due to animal (cat) (dog) hair and dander: Secondary | ICD-10-CM | POA: Diagnosis not present

## 2020-11-10 DIAGNOSIS — E559 Vitamin D deficiency, unspecified: Secondary | ICD-10-CM

## 2020-11-10 DIAGNOSIS — Z6830 Body mass index (BMI) 30.0-30.9, adult: Secondary | ICD-10-CM

## 2020-11-10 DIAGNOSIS — J301 Allergic rhinitis due to pollen: Secondary | ICD-10-CM | POA: Diagnosis not present

## 2020-11-10 DIAGNOSIS — J3089 Other allergic rhinitis: Secondary | ICD-10-CM | POA: Diagnosis not present

## 2020-11-10 DIAGNOSIS — Z9189 Other specified personal risk factors, not elsewhere classified: Secondary | ICD-10-CM | POA: Diagnosis not present

## 2020-11-10 DIAGNOSIS — E781 Pure hyperglyceridemia: Secondary | ICD-10-CM | POA: Diagnosis not present

## 2020-11-10 DIAGNOSIS — J309 Allergic rhinitis, unspecified: Secondary | ICD-10-CM | POA: Insufficient documentation

## 2020-11-10 DIAGNOSIS — J4520 Mild intermittent asthma, uncomplicated: Secondary | ICD-10-CM | POA: Insufficient documentation

## 2020-11-10 DIAGNOSIS — R7303 Prediabetes: Secondary | ICD-10-CM | POA: Diagnosis not present

## 2020-11-10 DIAGNOSIS — K219 Gastro-esophageal reflux disease without esophagitis: Secondary | ICD-10-CM | POA: Insufficient documentation

## 2020-11-10 NOTE — Progress Notes (Signed)
Chief Complaint:   OBESITY [name] Park is here to discuss her progress with her obesity treatment plan along with follow-up of her obesity related diagnoses. [name] Park is on keeping a food journal and adhering to recommended goals of 1300-1400 calories and 85 grams of protein and states she is following her eating plan approximately 75-80% of the time. [name] Park states she is walking for 15-25 minutes 1-2 times per week.  Today's visit was #: 20 Starting weight: 177 lbs Starting date: 10/02/2019 Today's weight: 149 lbs Today's date: 11/10/2020 Total lbs lost to date: 28 lbs Total lbs lost since last in-office visit: 1 lb  Interim History: [name] feels that she is tracking/hitting goals 75-80 of the time%.   To increase daily protein, she has incorporated high protein oatmeal for breakfast and at lunch, 5 ounces of protein. Reviewed body composition with patient.  Subjective:   1. Vitamin D deficiency She is on OTC vitamin D3 2,000 IU daily.  Lab Results  Component Value Date   VD25OH 59.7 07/22/2020   VD25OH 73.4 04/21/2020   VD25OH 55.9 01/14/2020   2. Pre-diabetes She is on metformin 500 mg - 2 tablets with breakfast and 2 tablets with dinner. She denies GI upset.  She is unsure of family history of diabetes.  Lab Results  Component Value Date   HGBA1C 5.5 07/22/2020   Lab Results  Component Value Date   INSULIN 16.3 07/22/2020   INSULIN 18.7 04/21/2020   INSULIN 28.3 (H) 01/14/2020   INSULIN 69.5 (H) 10/02/2019   3. Hypertriglyceridemia She is on Vascepa 1g - 2 caps BID. She denies tobacco/vape use.  Lab Results  Component Value Date   ALT 9 07/22/2020   AST 13 07/22/2020   ALKPHOS 73 07/22/2020   BILITOT 0.3 07/22/2020   Lab Results  Component Value Date   CHOL 203 (H) 07/22/2020   HDL 54 07/22/2020   LDLCALC 109 (H) 07/22/2020   TRIG 235 (H) 07/22/2020   CHOLHDL 3.8 07/22/2020   4. At risk for heart disease [name] Park is at a higher than average risk for  cardiovascular disease due to hyperlipidemia, prediabetes, and obesity.    Assessment/Plan:   1. Vitamin D deficiency Check labs.  - VITAMIN D 25 Hydroxy (Vit-D Deficiency, Fractures)  2. Pre-diabetes Check labs today.  - Comprehensive metabolic panel - Hemoglobin A1c - Insulin, random  3. Hypertriglyceridemia Will check labs today.  - Comprehensive metabolic panel - Lipid panel  4. At risk for heart disease [name] Park was given approximately 15 minutes of coronary artery disease prevention counseling today. She is 45 y.o. female and has risk factors for heart disease including obesity. We discussed intensive lifestyle modifications today with an emphasis on specific weight loss instructions and strategies.   Repetitive spaced learning was employed today to elicit superior memory formation and behavioral change.   5. Obesity with current BMI 27.6  Randy is currently in the action stage of change. As such, her goal is to continue with weight loss efforts. She has agreed to keeping a food journal and adhering to recommended goals of 1300-1400 calories and 85 grams of protein.   Exercise goals:  As is.  Behavioral modification strategies: increasing lean protein intake, decreasing simple carbohydrates, meal planning and cooking strategies, keeping healthy foods in the home, and planning for success.  Handout:  High Protein/Low Carbohydrate List.  Protein Content List.  [name] Park has agreed to follow-up with our clinic in 3-4 weeks. She was informed of the importance  of frequent follow-up visits to maximize her success with intensive lifestyle modifications for her multiple health conditions.   [name] Park was informed we would discuss her lab results at her next visit unless there is a critical issue that needs to be addressed sooner. [name] Park agreed to keep her next visit at the agreed upon time to discuss these results.  Objective:   Blood pressure 105/72, pulse 98, temperature 97.9 F  (36.6 C), height 5\' 2"  (1.575 m), weight 149 lb (67.6 kg), SpO2 99 %. Body mass index is 27.25 kg/m.  General: Cooperative, alert, well developed, in no acute distress. HEENT: Conjunctivae and lids unremarkable. Cardiovascular: Regular rhythm.  Lungs: Normal work of breathing. Neurologic: No focal deficits.   Lab Results  Component Value Date   CREATININE 0.82 07/22/2020   BUN 14 07/22/2020   NA 140 07/22/2020   K 4.6 07/22/2020   CL 100 07/22/2020   CO2 23 07/22/2020   Lab Results  Component Value Date   ALT 9 07/22/2020   AST 13 07/22/2020   ALKPHOS 73 07/22/2020   BILITOT 0.3 07/22/2020   Lab Results  Component Value Date   HGBA1C 5.5 07/22/2020   HGBA1C 5.4 04/21/2020   HGBA1C 5.6 01/14/2020   HGBA1C 6.0 (H) 10/02/2019   Lab Results  Component Value Date   INSULIN 16.3 07/22/2020   INSULIN 18.7 04/21/2020   INSULIN 28.3 (H) 01/14/2020   INSULIN 69.5 (H) 10/02/2019   Lab Results  Component Value Date   TSH 1.850 06/03/2020   Lab Results  Component Value Date   CHOL 203 (H) 07/22/2020   HDL 54 07/22/2020   LDLCALC 109 (H) 07/22/2020   TRIG 235 (H) 07/22/2020   CHOLHDL 3.8 07/22/2020   Lab Results  Component Value Date   VD25OH 59.7 07/22/2020   VD25OH 73.4 04/21/2020   VD25OH 55.9 01/14/2020   Lab Results  Component Value Date   WBC 8.1 10/02/2019   HGB 13.3 10/02/2019   HCT 39.1 10/02/2019   MCV 89 10/02/2019   PLT 306 10/02/2019   Attestation Statements:   Reviewed by clinician on day of visit: allergies, medications, problem list, medical history, surgical history, family history, social history, and previous encounter notes.  I, 10/04/2019, CMA, am acting as Insurance claims handler for Energy manager, NP.  I have reviewed the above documentation for accuracy and completeness, and I agree with the above. -  Florean Hoobler d. Meili Kleckley, NP-C

## 2020-11-11 LAB — COMPREHENSIVE METABOLIC PANEL
ALT: 14 IU/L (ref 0–32)
AST: 13 IU/L (ref 0–40)
Albumin/Globulin Ratio: 1.9 (ref 1.2–2.2)
Albumin: 4.2 g/dL (ref 3.8–4.8)
Alkaline Phosphatase: 67 IU/L (ref 44–121)
BUN/Creatinine Ratio: 18 (ref 9–23)
BUN: 13 mg/dL (ref 6–24)
Bilirubin Total: 0.3 mg/dL (ref 0.0–1.2)
CO2: 22 mmol/L (ref 20–29)
Calcium: 9.7 mg/dL (ref 8.7–10.2)
Chloride: 100 mmol/L (ref 96–106)
Creatinine, Ser: 0.74 mg/dL (ref 0.57–1.00)
Globulin, Total: 2.2 g/dL (ref 1.5–4.5)
Glucose: 87 mg/dL (ref 65–99)
Potassium: 4.4 mmol/L (ref 3.5–5.2)
Sodium: 138 mmol/L (ref 134–144)
Total Protein: 6.4 g/dL (ref 6.0–8.5)
eGFR: 103 mL/min/{1.73_m2} (ref >59)

## 2020-11-11 LAB — HEMOGLOBIN A1C
Est. average glucose Bld gHb Est-mCnc: 117 mg/dL
Hgb A1c MFr Bld: 5.7 % — ABNORMAL HIGH (ref 4.8–5.6)

## 2020-11-11 LAB — LIPID PANEL
Chol/HDL Ratio: 4 ratio (ref 0.0–4.4)
Cholesterol, Total: 196 mg/dL (ref 100–199)
HDL: 49 mg/dL (ref >39)
LDL Chol Calc (NIH): 110 mg/dL — ABNORMAL HIGH (ref 0–99)
Triglycerides: 215 mg/dL — ABNORMAL HIGH (ref 0–149)
VLDL Cholesterol Cal: 37 mg/dL (ref 5–40)

## 2020-11-11 LAB — VITAMIN D 25 HYDROXY (VIT D DEFICIENCY, FRACTURES): Vit D, 25-Hydroxy: 70.9 ng/mL (ref 30.0–100.0)

## 2020-11-11 LAB — INSULIN, RANDOM: INSULIN: 15.8 u[IU]/mL (ref 2.6–24.9)

## 2020-11-16 DIAGNOSIS — F411 Generalized anxiety disorder: Secondary | ICD-10-CM | POA: Diagnosis not present

## 2020-11-17 DIAGNOSIS — M542 Cervicalgia: Secondary | ICD-10-CM | POA: Diagnosis not present

## 2020-11-17 DIAGNOSIS — G43719 Chronic migraine without aura, intractable, without status migrainosus: Secondary | ICD-10-CM | POA: Diagnosis not present

## 2020-11-24 DIAGNOSIS — E039 Hypothyroidism, unspecified: Secondary | ICD-10-CM | POA: Diagnosis not present

## 2020-11-30 DIAGNOSIS — J301 Allergic rhinitis due to pollen: Secondary | ICD-10-CM | POA: Diagnosis not present

## 2020-11-30 DIAGNOSIS — J3089 Other allergic rhinitis: Secondary | ICD-10-CM | POA: Diagnosis not present

## 2020-11-30 DIAGNOSIS — J3081 Allergic rhinitis due to animal (cat) (dog) hair and dander: Secondary | ICD-10-CM | POA: Diagnosis not present

## 2020-12-01 DIAGNOSIS — F411 Generalized anxiety disorder: Secondary | ICD-10-CM | POA: Diagnosis not present

## 2020-12-09 ENCOUNTER — Ambulatory Visit (INDEPENDENT_AMBULATORY_CARE_PROVIDER_SITE_OTHER): Payer: BC Managed Care – PPO | Admitting: Adult Health

## 2020-12-09 ENCOUNTER — Other Ambulatory Visit: Payer: Self-pay

## 2020-12-09 ENCOUNTER — Encounter (INDEPENDENT_AMBULATORY_CARE_PROVIDER_SITE_OTHER): Payer: Self-pay | Admitting: Adult Health

## 2020-12-09 VITALS — BP 114/73 | HR 101 | Temp 98.0°F | Ht 62.0 in | Wt 154.0 lb

## 2020-12-09 DIAGNOSIS — Z6830 Body mass index (BMI) 30.0-30.9, adult: Secondary | ICD-10-CM

## 2020-12-09 DIAGNOSIS — E781 Pure hyperglyceridemia: Secondary | ICD-10-CM

## 2020-12-09 DIAGNOSIS — Z9189 Other specified personal risk factors, not elsewhere classified: Secondary | ICD-10-CM

## 2020-12-09 DIAGNOSIS — E559 Vitamin D deficiency, unspecified: Secondary | ICD-10-CM | POA: Diagnosis not present

## 2020-12-09 DIAGNOSIS — R7303 Prediabetes: Secondary | ICD-10-CM | POA: Diagnosis not present

## 2020-12-09 DIAGNOSIS — E669 Obesity, unspecified: Secondary | ICD-10-CM | POA: Diagnosis not present

## 2020-12-09 MED ORDER — VASCEPA 1 G PO CAPS: ORAL_CAPSULE | ORAL | 2 refills | Status: DC

## 2020-12-09 MED ORDER — OZEMPIC (0.25 OR 0.5 MG/DOSE) 2 MG/1.5ML ~~LOC~~ SOPN: 0.2500 mg | PEN_INJECTOR | SUBCUTANEOUS | 0 refills | Status: DC

## 2020-12-09 NOTE — Progress Notes (Signed)
Chief Complaint:   OBESITY [name] Park is here to discuss her progress with her obesity treatment plan along with follow-up of her obesity related diagnoses. [name] Park is on keeping a food journal and adhering to recommended goals of 1300-1400 calories and 85 grams of protein and states she is following her eating plan approximately 100% of the time. [name] Park states she is not exercising regularly.  Today's visit was #: 21 Starting weight: 177 lbs Starting date: 10/02/2019 Today's weight: 154 lbs Today's date: 12/09/2020 Total lbs lost to date: 23 lbs Total lbs lost since last in-office visit: 0  Interim History: On 10/27/2020, positive COVID-19 test.   [name] Park has been unable to exercise due to lingering body aches related to recent COVID-19 infection. She reports appetite has returned to normal. She tracks intake 100%.  She estimates 75-85 grams of protein per day and 1350-1550 calories per day.  Subjective:   1. Hypertriglyceridemia Discussed labs with patient today.  On 11/10/2020, lipid panel - Tot, HDL at goal.  TGs and LDL both elevated. She reports family history of hypertriglyceridemia. ASCVD risk stratification 0.7%. She is on Vascepa 1g- 2 caps BID. She denies tobacco/vape use.  2. Pre-diabetes Worsening.  Discussed labs with patient today.  On 11/10/2020, BG 87, A1c 5.7, insulin level 15.8. Worsening A1c. She has been on metformin for years - currently on metformin 500 mg - 2 tablets BID - managed by Endo/Dr. Talmage Nap. She denies family history of MTC or personal history of pancreatitis.  She was previously on Saxenda- tolerated well.  3. Vitamin D deficiency Discussed labs with patient today.  On 11/10/2020, vitamin D level - 70.9 - at goal. She is on OTC vitamin D3 2,000 IU daily.  4. At risk for nausea Koraline is at risk for nausea due to starting GLP-1 for worsening prediabetes.  Assessment/Plan:   1. Hypertriglyceridemia Refill Vascepa 1 g - 2 capsules BID, as per  below.  - Refill VASCEPA 1 g capsule; TAKE TWO CAPSULES TWICE DAILY  Dispense: 120 capsule; Refill: 2  Limit saturated fat and increase activity as tolerated.  2. Pre-diabetes Continue metformin. Start Ozempic 0.25 mg once weekly, as per below.  - Start Semaglutide,0.25 or 0.5MG /DOS, (OZEMPIC, 0.25 OR 0.5 MG/DOSE,) 2 MG/1.5ML SOPN; Inject 0.25 mg into the skin once a week.  Dispense: 2 mL; Refill: 0  3. Vitamin D deficiency Continue OTC vitamin D3 2,000 IU daily.  4. At risk for nausea NAVPREET SZCZYGIEL was given approximately 15 minutes of nausea prevention counseling today. [name] Park is at risk for nausea due to her new or current medication. She was encouraged to titrate her medication slowly, make sure to stay hydrated, eat smaller portions throughout the day, and avoid high fat meals.   6. Obesity with current BMI 28.3  [name] Park is currently in the action stage of change. As such, her goal is to continue with weight loss efforts. She has agreed to keeping a food journal and adhering to recommended goals of 1300-1400 calories and 85 grams of protein.   Exercise goals:  Advance activity as tolerated.  Behavioral modification strategies: increasing lean protein intake, decreasing simple carbohydrates, meal planning and cooking strategies, keeping healthy foods in the home, and planning for success.  Loraine has agreed to follow-up with our clinic in 3-4 weeks. She was informed of the importance of frequent follow-up visits to maximize her success with intensive lifestyle modifications for her multiple health conditions.   Objective:   Blood pressure 114/73, pulse Marland Kitchen)  101, temperature 98 F (36.7 C), height 5\' 2"  (1.575 m), weight 154 lb (69.9 kg), SpO2 98 %. Body mass index is 28.17 kg/m.  General: Cooperative, alert, well developed, in no acute distress. HEENT: Conjunctivae and lids unremarkable. Cardiovascular: Regular rhythm.  Lungs: Normal work of breathing. Neurologic: No  focal deficits.   Lab Results  Component Value Date   CREATININE 0.74 11/10/2020   BUN 13 11/10/2020   NA 138 11/10/2020   K 4.4 11/10/2020   CL 100 11/10/2020   CO2 22 11/10/2020   Lab Results  Component Value Date   ALT 14 11/10/2020   AST 13 11/10/2020   ALKPHOS 67 11/10/2020   BILITOT 0.3 11/10/2020   Lab Results  Component Value Date   HGBA1C 5.7 (H) 11/10/2020   HGBA1C 5.5 07/22/2020   HGBA1C 5.4 04/21/2020   HGBA1C 5.6 01/14/2020   HGBA1C 6.0 (H) 10/02/2019   Lab Results  Component Value Date   INSULIN 15.8 11/10/2020   INSULIN 16.3 07/22/2020   INSULIN 18.7 04/21/2020   INSULIN 28.3 (H) 01/14/2020   INSULIN 69.5 (H) 10/02/2019   Lab Results  Component Value Date   TSH 1.850 06/03/2020   Lab Results  Component Value Date   CHOL 196 11/10/2020   HDL 49 11/10/2020   LDLCALC 110 (H) 11/10/2020   TRIG 215 (H) 11/10/2020   CHOLHDL 4.0 11/10/2020   Lab Results  Component Value Date   VD25OH 70.9 11/10/2020   VD25OH 59.7 07/22/2020   VD25OH 73.4 04/21/2020   Lab Results  Component Value Date   WBC 8.1 10/02/2019   HGB 13.3 10/02/2019   HCT 39.1 10/02/2019   MCV 89 10/02/2019   PLT 306 10/02/2019   Attestation Statements:   Reviewed by clinician on day of visit: allergies, medications, problem list, medical history, surgical history, family history, social history, and previous encounter notes.  I, 10/04/2019, CMA, am acting as Insurance claims handler for Energy manager, NP.  I have reviewed the above documentation for accuracy and completeness, and I agree with the above. -  Adaira Centola d. Symia Herdt, NP-C

## 2020-12-21 DIAGNOSIS — J3081 Allergic rhinitis due to animal (cat) (dog) hair and dander: Secondary | ICD-10-CM | POA: Diagnosis not present

## 2020-12-21 DIAGNOSIS — J3089 Other allergic rhinitis: Secondary | ICD-10-CM | POA: Diagnosis not present

## 2020-12-21 DIAGNOSIS — J301 Allergic rhinitis due to pollen: Secondary | ICD-10-CM | POA: Diagnosis not present

## 2020-12-28 DIAGNOSIS — M542 Cervicalgia: Secondary | ICD-10-CM | POA: Diagnosis not present

## 2020-12-28 DIAGNOSIS — G43719 Chronic migraine without aura, intractable, without status migrainosus: Secondary | ICD-10-CM | POA: Diagnosis not present

## 2020-12-29 DIAGNOSIS — F1020 Alcohol dependence, uncomplicated: Secondary | ICD-10-CM | POA: Diagnosis not present

## 2020-12-29 DIAGNOSIS — F3189 Other bipolar disorder: Secondary | ICD-10-CM | POA: Diagnosis not present

## 2021-01-04 DIAGNOSIS — F411 Generalized anxiety disorder: Secondary | ICD-10-CM | POA: Diagnosis not present

## 2021-01-04 DIAGNOSIS — J301 Allergic rhinitis due to pollen: Secondary | ICD-10-CM | POA: Diagnosis not present

## 2021-01-04 DIAGNOSIS — J3081 Allergic rhinitis due to animal (cat) (dog) hair and dander: Secondary | ICD-10-CM | POA: Diagnosis not present

## 2021-01-05 DIAGNOSIS — J3089 Other allergic rhinitis: Secondary | ICD-10-CM | POA: Diagnosis not present

## 2021-01-06 ENCOUNTER — Other Ambulatory Visit: Payer: Self-pay

## 2021-01-06 ENCOUNTER — Ambulatory Visit (INDEPENDENT_AMBULATORY_CARE_PROVIDER_SITE_OTHER): Payer: BC Managed Care – PPO | Admitting: Adult Health

## 2021-01-06 ENCOUNTER — Encounter (INDEPENDENT_AMBULATORY_CARE_PROVIDER_SITE_OTHER): Payer: Self-pay | Admitting: Adult Health

## 2021-01-06 VITALS — BP 103/66 | HR 104 | Temp 97.8°F | Ht 62.0 in | Wt 149.0 lb

## 2021-01-06 DIAGNOSIS — Z6830 Body mass index (BMI) 30.0-30.9, adult: Secondary | ICD-10-CM

## 2021-01-06 DIAGNOSIS — R7303 Prediabetes: Secondary | ICD-10-CM | POA: Diagnosis not present

## 2021-01-06 DIAGNOSIS — Z9189 Other specified personal risk factors, not elsewhere classified: Secondary | ICD-10-CM

## 2021-01-06 DIAGNOSIS — E669 Obesity, unspecified: Secondary | ICD-10-CM | POA: Diagnosis not present

## 2021-01-06 MED ORDER — OZEMPIC (0.25 OR 0.5 MG/DOSE) 2 MG/1.5ML ~~LOC~~ SOPN: 0.5000 mg | PEN_INJECTOR | SUBCUTANEOUS | 0 refills | Status: DC

## 2021-01-06 NOTE — Progress Notes (Signed)
Chief Complaint:   OBESITY [name] Park is here to discuss her progress with her obesity treatment plan along with follow-up of her obesity related diagnoses. [name] Park is on keeping a food journal and adhering to recommended goals of 1300-1400 calories and 85 grams of protein and states she is following her eating plan approximately 50-60% of the time. [name] Park states she is not exercising regularly.  Today's visit was #: 22 Starting weight: 177 lbs Starting date: 10/02/2019 Today's weight: 149 lbs Today's date: 01/06/2021 Total lbs lost to date: 5 lbs Total lbs lost since last in-office visit: 28 lbs  Interim History:  [name] Park was started on Ozempic 0.25 mg at last office visit - 12/09/2020.  She has had 3 doses at this strength. She reports decreased polyphagia.    Subjective:   1. Pre-diabetes [name] Park was started on Ozempic 0.25 mg at last office visit - 12/09/2020.  She has had 3 doses at this strength. She reports decreased polyphagia.   She denies mass in neck, dysphagia, dyspepsia, or persistent hoarseness.  2. At risk for constipation [name] is at increased risk for constipation due to increasing Ozempic to 0.5 mg for worsening prediabetes.  Assessment/Plan:   1. Pre-diabetes Take 4th dose of Ozempic 0.25 mg once weekly. Increase and refill Ozempic to 0.5 mg once weekly for 5th dose- hold at this dose.  - Increase and refill Semaglutide,0.25 or 0.5MG /DOS, (OZEMPIC, 0.25 OR 0.5 MG/DOSE,) 2 MG/1.5ML SOPN; Inject 0.5 mg into the skin once a week.  Dispense: 2 mL; Refill: 0  2. At risk for constipation [name] Park was given approximately 15 minutes of counseling today regarding prevention of constipation. She was encouraged to increase water and fiber intake.    3. Obesity with current BMI 27.3  [name] Park is currently in the action stage of change. As such, her goal is to continue with weight loss efforts. She has agreed to keeping a food journal and adhering to recommended goals of  1300-1400 calories and 85 grams of protein.   Exercise goals:  Increase daily walking 5 minutes per day.  Behavioral modification strategies: increasing lean protein intake, decreasing simple carbohydrates, meal planning and cooking strategies, keeping healthy foods in the home, and planning for success.  [name] Park has agreed to follow-up with our clinic in 4 weeks. She was informed of the importance of frequent follow-up visits to maximize her success with intensive lifestyle modifications for her multiple health conditions.   Objective:   Blood pressure 103/66, pulse (!) 104, temperature 97.8 F (36.6 C), height 5\' 2"  (1.575 m), weight 149 lb (67.6 kg), SpO2 99 %. Body mass index is 27.25 kg/m.  General: Cooperative, alert, well developed, in no acute distress. HEENT: Conjunctivae and lids unremarkable. Cardiovascular: Regular rhythm.  Lungs: Normal work of breathing. Neurologic: No focal deficits.   Lab Results  Component Value Date   CREATININE 0.74 11/10/2020   BUN 13 11/10/2020   NA 138 11/10/2020   K 4.4 11/10/2020   CL 100 11/10/2020   CO2 22 11/10/2020   Lab Results  Component Value Date   ALT 14 11/10/2020   AST 13 11/10/2020   ALKPHOS 67 11/10/2020   BILITOT 0.3 11/10/2020   Lab Results  Component Value Date   HGBA1C 5.7 (H) 11/10/2020   HGBA1C 5.5 07/22/2020   HGBA1C 5.4 04/21/2020   HGBA1C 5.6 01/14/2020   HGBA1C 6.0 (H) 10/02/2019   Lab Results  Component Value Date   INSULIN 15.8 11/10/2020   INSULIN 16.3 07/22/2020  INSULIN 18.7 04/21/2020   INSULIN 28.3 (H) 01/14/2020   INSULIN 69.5 (H) 10/02/2019   Lab Results  Component Value Date   TSH 1.850 06/03/2020   Lab Results  Component Value Date   CHOL 196 11/10/2020   HDL 49 11/10/2020   LDLCALC 110 (H) 11/10/2020   TRIG 215 (H) 11/10/2020   CHOLHDL 4.0 11/10/2020   Lab Results  Component Value Date   VD25OH 70.9 11/10/2020   VD25OH 59.7 07/22/2020   VD25OH 73.4 04/21/2020   Lab  Results  Component Value Date   WBC 8.1 10/02/2019   HGB 13.3 10/02/2019   HCT 39.1 10/02/2019   MCV 89 10/02/2019   PLT 306 10/02/2019   Attestation Statements:   Reviewed by clinician on day of visit: allergies, medications, problem list, medical history, surgical history, family history, social history, and previous encounter notes.  I, Insurance claims handler, CMA, am acting as Energy manager for [name] Hamburger, NP.  I have reviewed the above documentation for accuracy and completeness, and I agree with the above. -  Bradin Mcadory d. Nikita Surman, NP-C

## 2021-01-11 DIAGNOSIS — J3081 Allergic rhinitis due to animal (cat) (dog) hair and dander: Secondary | ICD-10-CM | POA: Diagnosis not present

## 2021-01-11 DIAGNOSIS — J301 Allergic rhinitis due to pollen: Secondary | ICD-10-CM | POA: Diagnosis not present

## 2021-01-11 DIAGNOSIS — J3089 Other allergic rhinitis: Secondary | ICD-10-CM | POA: Diagnosis not present

## 2021-01-11 DIAGNOSIS — J0100 Acute maxillary sinusitis, unspecified: Secondary | ICD-10-CM | POA: Diagnosis not present

## 2021-01-26 DIAGNOSIS — J3089 Other allergic rhinitis: Secondary | ICD-10-CM | POA: Diagnosis not present

## 2021-01-26 DIAGNOSIS — J3081 Allergic rhinitis due to animal (cat) (dog) hair and dander: Secondary | ICD-10-CM | POA: Diagnosis not present

## 2021-01-26 DIAGNOSIS — J301 Allergic rhinitis due to pollen: Secondary | ICD-10-CM | POA: Diagnosis not present

## 2021-01-26 DIAGNOSIS — J4520 Mild intermittent asthma, uncomplicated: Secondary | ICD-10-CM | POA: Diagnosis not present

## 2021-02-02 DIAGNOSIS — J301 Allergic rhinitis due to pollen: Secondary | ICD-10-CM | POA: Diagnosis not present

## 2021-02-02 DIAGNOSIS — J3081 Allergic rhinitis due to animal (cat) (dog) hair and dander: Secondary | ICD-10-CM | POA: Diagnosis not present

## 2021-02-02 DIAGNOSIS — J3089 Other allergic rhinitis: Secondary | ICD-10-CM | POA: Diagnosis not present

## 2021-02-03 DIAGNOSIS — G4733 Obstructive sleep apnea (adult) (pediatric): Secondary | ICD-10-CM | POA: Diagnosis not present

## 2021-02-08 ENCOUNTER — Other Ambulatory Visit: Payer: Self-pay

## 2021-02-08 ENCOUNTER — Ambulatory Visit (INDEPENDENT_AMBULATORY_CARE_PROVIDER_SITE_OTHER): Payer: BC Managed Care – PPO | Admitting: Adult Health

## 2021-02-08 ENCOUNTER — Encounter (INDEPENDENT_AMBULATORY_CARE_PROVIDER_SITE_OTHER): Payer: Self-pay | Admitting: Adult Health

## 2021-02-08 VITALS — BP 134/83 | HR 108 | Temp 97.5°F | Ht 62.0 in | Wt 144.0 lb

## 2021-02-08 DIAGNOSIS — F411 Generalized anxiety disorder: Secondary | ICD-10-CM | POA: Diagnosis not present

## 2021-02-08 DIAGNOSIS — Z9189 Other specified personal risk factors, not elsewhere classified: Secondary | ICD-10-CM

## 2021-02-08 DIAGNOSIS — R7303 Prediabetes: Secondary | ICD-10-CM

## 2021-02-08 DIAGNOSIS — F331 Major depressive disorder, recurrent, moderate: Secondary | ICD-10-CM | POA: Diagnosis not present

## 2021-02-08 DIAGNOSIS — Z6830 Body mass index (BMI) 30.0-30.9, adult: Secondary | ICD-10-CM

## 2021-02-08 DIAGNOSIS — E669 Obesity, unspecified: Secondary | ICD-10-CM | POA: Diagnosis not present

## 2021-02-08 MED ORDER — OZEMPIC (0.25 OR 0.5 MG/DOSE) 2 MG/1.5ML ~~LOC~~ SOPN: 0.2500 mg | PEN_INJECTOR | SUBCUTANEOUS | 0 refills | Status: DC

## 2021-02-08 NOTE — Progress Notes (Signed)
Chief Complaint:   OBESITY [name] Park is here to discuss her progress with her obesity treatment plan along with follow-up of her obesity related diagnoses. [name] Park is on keeping a food journal and adhering to recommended goals of 1300-1400 calories and 85 grams of protein and states she is following her eating plan approximately 80-85% of the time. [name] Park states she is walking for 20 minutes 2 times per week.  Today's visit was #: 23 Starting weight: 177 lbs Starting date: 10/02/2019 Today's weight: 144 lbs Today's date: 02/08/2021 Total lbs lost to date: 33 lbs Total lbs lost since last in-office visit: 5 lbs  Interim History:  [name] Park has been experiencing abdominal cramping with "slight nausea" with every meal.   Ozempic was increased to 0.5 mg on 01/06/2021 - has had 4 doses at this strength.   Injects Ozempic on Sunday.  Monday GI upset.  Reviewed bioimpedance with patient. Current weight 144 pounds, BMI 26, Visceral Adipose Rating- 5.   Goal weight 144-150.  Subjective:   1. Pre-diabetes On 11/10/2020, A1c was 5.7 - up from 5.5/5.4/5.6 - last 3 lab values. [name] Park has been experiencing abdominal cramping with "slight nausea" with every meal.   Ozempic was increased to 0.5 mg on 01/06/2021 - has had 4 doses at this strength.   Injects Ozempic on Sunday.  Monday GI upset.  2. At risk for deficient intake of food The patient is at a higher than average risk of deficient intake of food due to GI upset with GLP-1.   Assessment/Plan:   1. Pre-diabetes Decrease Ozempic to 0.25 mg once weekly. Check fasting labs at next office visit.  - Decrease Semaglutide,0.25 or 0.5MG /DOS, (OZEMPIC, 0.25 OR 0.5 MG/DOSE,) 2 MG/1.5ML SOPN; Inject 0.25 mg into the skin once a week.  Dispense: 2 mL; Refill: 0  2. At risk for deficient intake of food [name] Park was given approximately 15 minutes of deficit intake of food prevention counseling today. [name] Park is at risk for eating too few calories based on  current food recall. She was encouraged to focus on meeting caloric and protein goals according to her recommended meal plan.   3. Obesity with current BMI 26.4  [name] Park is currently in the action stage of change. As such, her goal is to continue with weight loss efforts. She has agreed to keeping a food journal and adhering to recommended goals of 1300-1400 calories and 85 grams of protein.   Exercise goals:  Increase walking to 20 minutes 3 times per week.  Behavioral modification strategies: increasing lean protein intake, decreasing simple carbohydrates, meal planning and cooking strategies, keeping healthy foods in the home, planning for success, and keeping a strict food journal.  [name] Park has agreed to follow-up with our clinic in 4 weeks, fasting/IC.  She is aware to arrive 30 minutes prior to office visit. She was informed of the importance of frequent follow-up visits to maximize her success with intensive lifestyle modifications for her multiple health conditions.   Objective:   Blood pressure 134/83, pulse (!) 108, temperature (!) 97.5 F (36.4 C), height 5\' 2"  (1.575 m), weight 144 lb (65.3 kg), SpO2 99 %. Body mass index is 26.34 kg/m.  General: Cooperative, alert, well developed, in no acute distress. HEENT: Conjunctivae and lids unremarkable. Cardiovascular: Regular rhythm.  Lungs: Normal work of breathing. Neurologic: No focal deficits.   Lab Results  Component Value Date   CREATININE 0.74 11/10/2020   BUN 13 11/10/2020   NA 138 11/10/2020   K  4.4 11/10/2020   CL 100 11/10/2020   CO2 22 11/10/2020   Lab Results  Component Value Date   ALT 14 11/10/2020   AST 13 11/10/2020   ALKPHOS 67 11/10/2020   BILITOT 0.3 11/10/2020   Lab Results  Component Value Date   HGBA1C 5.7 (H) 11/10/2020   HGBA1C 5.5 07/22/2020   HGBA1C 5.4 04/21/2020   HGBA1C 5.6 01/14/2020   HGBA1C 6.0 (H) 10/02/2019   Lab Results  Component Value Date   INSULIN 15.8 11/10/2020   INSULIN  16.3 07/22/2020   INSULIN 18.7 04/21/2020   INSULIN 28.3 (H) 01/14/2020   INSULIN 69.5 (H) 10/02/2019   Lab Results  Component Value Date   TSH 1.850 06/03/2020   Lab Results  Component Value Date   CHOL 196 11/10/2020   HDL 49 11/10/2020   LDLCALC 110 (H) 11/10/2020   TRIG 215 (H) 11/10/2020   CHOLHDL 4.0 11/10/2020   Lab Results  Component Value Date   VD25OH 70.9 11/10/2020   VD25OH 59.7 07/22/2020   VD25OH 73.4 04/21/2020   Lab Results  Component Value Date   WBC 8.1 10/02/2019   HGB 13.3 10/02/2019   HCT 39.1 10/02/2019   MCV 89 10/02/2019   PLT 306 10/02/2019   Attestation Statements:   Reviewed by clinician on day of visit: allergies, medications, problem list, medical history, surgical history, family history, social history, and previous encounter notes.  I, Insurance claims handler, CMA, am acting as Energy manager for William Hamburger, NP.  I have reviewed the above documentation for accuracy and completeness, and I agree with the above. -  Evelyne Makepeace d. Keenan Dimitrov, NP-C

## 2021-02-09 DIAGNOSIS — J3081 Allergic rhinitis due to animal (cat) (dog) hair and dander: Secondary | ICD-10-CM | POA: Diagnosis not present

## 2021-02-09 DIAGNOSIS — J3089 Other allergic rhinitis: Secondary | ICD-10-CM | POA: Diagnosis not present

## 2021-02-09 DIAGNOSIS — J301 Allergic rhinitis due to pollen: Secondary | ICD-10-CM | POA: Diagnosis not present

## 2021-02-15 DIAGNOSIS — G43719 Chronic migraine without aura, intractable, without status migrainosus: Secondary | ICD-10-CM | POA: Diagnosis not present

## 2021-02-15 DIAGNOSIS — M542 Cervicalgia: Secondary | ICD-10-CM | POA: Diagnosis not present

## 2021-02-16 DIAGNOSIS — J3089 Other allergic rhinitis: Secondary | ICD-10-CM | POA: Diagnosis not present

## 2021-02-16 DIAGNOSIS — J3081 Allergic rhinitis due to animal (cat) (dog) hair and dander: Secondary | ICD-10-CM | POA: Diagnosis not present

## 2021-02-16 DIAGNOSIS — J301 Allergic rhinitis due to pollen: Secondary | ICD-10-CM | POA: Diagnosis not present

## 2021-02-22 DIAGNOSIS — F411 Generalized anxiety disorder: Secondary | ICD-10-CM | POA: Diagnosis not present

## 2021-02-22 DIAGNOSIS — F331 Major depressive disorder, recurrent, moderate: Secondary | ICD-10-CM | POA: Diagnosis not present

## 2021-02-23 DIAGNOSIS — J3081 Allergic rhinitis due to animal (cat) (dog) hair and dander: Secondary | ICD-10-CM | POA: Diagnosis not present

## 2021-02-23 DIAGNOSIS — J301 Allergic rhinitis due to pollen: Secondary | ICD-10-CM | POA: Diagnosis not present

## 2021-02-23 DIAGNOSIS — J3089 Other allergic rhinitis: Secondary | ICD-10-CM | POA: Diagnosis not present

## 2021-03-02 DIAGNOSIS — J0101 Acute recurrent maxillary sinusitis: Secondary | ICD-10-CM | POA: Diagnosis not present

## 2021-03-02 DIAGNOSIS — R059 Cough, unspecified: Secondary | ICD-10-CM | POA: Diagnosis not present

## 2021-03-02 DIAGNOSIS — J029 Acute pharyngitis, unspecified: Secondary | ICD-10-CM | POA: Diagnosis not present

## 2021-03-02 DIAGNOSIS — Z20822 Contact with and (suspected) exposure to covid-19: Secondary | ICD-10-CM | POA: Diagnosis not present

## 2021-03-15 DIAGNOSIS — F331 Major depressive disorder, recurrent, moderate: Secondary | ICD-10-CM | POA: Diagnosis not present

## 2021-03-15 DIAGNOSIS — F411 Generalized anxiety disorder: Secondary | ICD-10-CM | POA: Diagnosis not present

## 2021-03-23 DIAGNOSIS — J3081 Allergic rhinitis due to animal (cat) (dog) hair and dander: Secondary | ICD-10-CM | POA: Diagnosis not present

## 2021-03-23 DIAGNOSIS — J3089 Other allergic rhinitis: Secondary | ICD-10-CM | POA: Diagnosis not present

## 2021-03-23 DIAGNOSIS — J301 Allergic rhinitis due to pollen: Secondary | ICD-10-CM | POA: Diagnosis not present

## 2021-03-28 ENCOUNTER — Other Ambulatory Visit: Payer: Self-pay

## 2021-03-28 ENCOUNTER — Ambulatory Visit (INDEPENDENT_AMBULATORY_CARE_PROVIDER_SITE_OTHER): Payer: BC Managed Care – PPO | Admitting: Adult Health

## 2021-03-28 ENCOUNTER — Encounter (INDEPENDENT_AMBULATORY_CARE_PROVIDER_SITE_OTHER): Payer: Self-pay | Admitting: Adult Health

## 2021-03-28 VITALS — BP 105/61 | HR 114 | Temp 97.9°F | Ht 62.0 in | Wt 144.0 lb

## 2021-03-28 DIAGNOSIS — E781 Pure hyperglyceridemia: Secondary | ICD-10-CM

## 2021-03-28 DIAGNOSIS — R0602 Shortness of breath: Secondary | ICD-10-CM | POA: Diagnosis not present

## 2021-03-28 DIAGNOSIS — E559 Vitamin D deficiency, unspecified: Secondary | ICD-10-CM

## 2021-03-28 DIAGNOSIS — R7303 Prediabetes: Secondary | ICD-10-CM | POA: Diagnosis not present

## 2021-03-28 DIAGNOSIS — E669 Obesity, unspecified: Secondary | ICD-10-CM

## 2021-03-28 DIAGNOSIS — Z6826 Body mass index (BMI) 26.0-26.9, adult: Secondary | ICD-10-CM

## 2021-03-28 DIAGNOSIS — Z9189 Other specified personal risk factors, not elsewhere classified: Secondary | ICD-10-CM

## 2021-03-28 NOTE — Progress Notes (Addendum)
Chief Complaint:   OBESITY [name] Park is here to discuss her progress with her obesity treatment plan along with follow-up of her obesity related diagnoses. [name] Park is on keeping a food journal and adhering to recommended goals of 1300-1400 calories and 85 grams of protein daily and states she is following her eating plan approximately 95-100% of the time. [name] Park states she walks for 20-25 minutes 2-3 times per week.  Today's visit was #: 24 Starting weight: 177 lbs Starting date: 10/02/2019 Today's weight: 144 lbs Today's date: 03/28/2021 Total lbs lost to date: 33 Total lbs lost since last in-office visit: 0  Interim History:  RMR on 10/02/2019 was 1433 RMR on 09/20/2020 was 1699 RMR today is 994.  Her metabolism has decreased by 705 calories.   Fat Mass 50 lbs, Muscle Mass 89 lbs.  MM > FM 89 lbs >50 lbs- composition at goal. Her visceral fat rating is 6, at goal.  She is at her goal weight- 144 Current BMI 26. She states "I feel great!".   Subjective:   1. SOB (shortness of breath) on exertion [name] Park reports shortness of breat with extreme exertion, and she denies chest pain.   2. Pre-diabetes [name] Park decreased Ozempic to 0.25 mg -she experienced abdominal cramping after each dose (2 total doses).  She denies nausea, vomiting, or constipation. She stopped Ozempic after 02/22/2021.  She remained on metformin 500 mg 2 tablets BID, managed by Endocrinology, Dr. Talmage Nap.   3. Hypertriglyceridemia [name] Park is on Vascepa 1 gram, 2 capsules BID.  Tolerating well.  4. Vitamin D deficiency [name] Park is on OTC Vit D3 2,000 IU daily.  5. At risk for diabetes mellitus [name] Park is at higher than average risk for developing diabetes due to obesity.   Assessment/Plan:   1. SOB (shortness of breath) on exertion IC was done today. [name] Park has agreed to work on weight loss and gradually increase exercise to treat her exercise induced shortness of breath. Will continue to monitor  closely.   2. Pre-diabetes We will check labs today. [name] Park will remain off Ozempic, and will continue metformin. She will continue to work on weight loss, exercise, and decreasing simple carbohydrates to help decrease the risk of diabetes.   - Comprehensive metabolic panel - Hemoglobin A1c - Insulin, random  3. Hypertriglyceridemia Cardiovascular risk and specific lipid/LDL goals reviewed. We discussed several lifestyle modifications today. We will check labs today. [name] Park will continue to work on diet, exercise and weight loss efforts. Orders and follow up as documented in patient record.   Counseling Intensive lifestyle modifications are the first line treatment for this issue. Dietary changes: Increase soluble fiber. Decrease simple carbohydrates. Exercise changes: Moderate to vigorous-intensity aerobic activity 150 minutes per week if tolerated. Lipid-lowering medications: see documented in medical record.  - Comprehensive metabolic panel - Lipid panel  4. Vitamin D deficiency We will check labs today. [name] Park will follow-up for routine testing of Vitamin D, at least 2-3 times per year to avoid over-replacement.  - VITAMIN D 25 Hydroxy (Vit-D Deficiency, Fractures)  5. At risk for diabetes mellitus [name] Park was given approximately 15 minutes of diabetes education and counseling today. We discussed intensive lifestyle modifications today with an emphasis on weight loss as well as increasing exercise and decreasing simple carbohydrates in her diet. We also reviewed medication options with an emphasis on risk versus benefit of those discussed.   Repetitive spaced learning was employed today to elicit superior memory formation and behavioral change.  6. Obesity with current  BMI 26.4 [name] Park is currently in the action stage of change. As such, her goal is to continue with weight loss efforts. She has agreed to keeping a food journal and adhering to recommended goals of 1250-1350 calories  and 85 grams of protein daily.  Will monitor hunger levels and weigh fluctuations- adjust eating plan at next OV accordingly.  Will recheck IC this quarter.   Exercise goals: As is.  Behavioral modification strategies: increasing lean protein intake, decreasing simple carbohydrates, meal planning and cooking strategies, keeping healthy foods in the home, and planning for success.  [name] Park has agreed to follow-up with our clinic in 6 weeks. She was informed of the importance of frequent follow-up visits to maximize her success with intensive lifestyle modifications for her multiple health conditions.   [name] Park was informed we would discuss her lab results at her next visit unless there is a critical issue that needs to be addressed sooner. [name] Park agreed to keep her next visit at the agreed upon time to discuss these results.  Objective:   Blood pressure 105/61, pulse (!) 114, temperature 97.9 F (36.6 C), height 5\' 2"  (1.575 m), weight 144 lb (65.3 kg), SpO2 97 %. Body mass index is 26.34 kg/m.  General: Cooperative, alert, well developed, in no acute distress. HEENT: Conjunctivae and lids unremarkable. Cardiovascular: Regular rhythm.  Lungs: Normal work of breathing. Neurologic: No focal deficits.   Lab Results  Component Value Date   CREATININE 0.74 11/10/2020   BUN 13 11/10/2020   NA 138 11/10/2020   K 4.4 11/10/2020   CL 100 11/10/2020   CO2 22 11/10/2020   Lab Results  Component Value Date   ALT 14 11/10/2020   AST 13 11/10/2020   ALKPHOS 67 11/10/2020   BILITOT 0.3 11/10/2020   Lab Results  Component Value Date   HGBA1C 5.7 (H) 11/10/2020   HGBA1C 5.5 07/22/2020   HGBA1C 5.4 04/21/2020   HGBA1C 5.6 01/14/2020   HGBA1C 6.0 (H) 10/02/2019   Lab Results  Component Value Date   INSULIN 15.8 11/10/2020   INSULIN 16.3 07/22/2020   INSULIN 18.7 04/21/2020   INSULIN 28.3 (H) 01/14/2020   INSULIN 69.5 (H) 10/02/2019   Lab Results  Component Value Date   TSH 1.850  06/03/2020   Lab Results  Component Value Date   CHOL 196 11/10/2020   HDL 49 11/10/2020   LDLCALC 110 (H) 11/10/2020   TRIG 215 (H) 11/10/2020   CHOLHDL 4.0 11/10/2020   Lab Results  Component Value Date   VD25OH 70.9 11/10/2020   VD25OH 59.7 07/22/2020   VD25OH 73.4 04/21/2020   Lab Results  Component Value Date   WBC 8.1 10/02/2019   HGB 13.3 10/02/2019   HCT 39.1 10/02/2019   MCV 89 10/02/2019   PLT 306 10/02/2019   No results found for: IRON, TIBC, FERRITIN  Attestation Statements:   Reviewed by clinician on day of visit: allergies, medications, problem list, medical history, surgical history, family history, social history, and previous encounter notes.   10/04/2019, am acting as transcriptionist for Trude Mcburney, NP.  I have reviewed the above documentation for accuracy and completeness, and I agree with the above. -  Eilene Voigt d. Fletcher Rathbun, NP-C

## 2021-03-30 DIAGNOSIS — F331 Major depressive disorder, recurrent, moderate: Secondary | ICD-10-CM | POA: Diagnosis not present

## 2021-03-30 DIAGNOSIS — F411 Generalized anxiety disorder: Secondary | ICD-10-CM | POA: Diagnosis not present

## 2021-03-30 LAB — COMPREHENSIVE METABOLIC PANEL
ALT: 10 IU/L (ref 0–32)
AST: 13 IU/L (ref 0–40)
Albumin/Globulin Ratio: 1.9 (ref 1.2–2.2)
Albumin: 4.3 g/dL (ref 3.8–4.8)
Alkaline Phosphatase: 88 IU/L (ref 44–121)
BUN/Creatinine Ratio: 12 (ref 9–23)
BUN: 8 mg/dL (ref 6–24)
Bilirubin Total: <0.2 mg/dL (ref 0.0–1.2)
CO2: 24 mmol/L (ref 20–29)
Calcium: 9.5 mg/dL (ref 8.7–10.2)
Chloride: 100 mmol/L (ref 96–106)
Creatinine, Ser: 0.67 mg/dL (ref 0.57–1.00)
Globulin, Total: 2.3 g/dL (ref 1.5–4.5)
Glucose: 104 mg/dL — ABNORMAL HIGH (ref 70–99)
Potassium: 4.3 mmol/L (ref 3.5–5.2)
Sodium: 138 mmol/L (ref 134–144)
Total Protein: 6.6 g/dL (ref 6.0–8.5)
eGFR: 111 mL/min/{1.73_m2} (ref >59)

## 2021-03-30 LAB — INSULIN, RANDOM: INSULIN: 21.6 u[IU]/mL (ref 2.6–24.9)

## 2021-03-30 LAB — LIPID PANEL
Chol/HDL Ratio: 3.5 ratio (ref 0.0–4.4)
Cholesterol, Total: 176 mg/dL (ref 100–199)
HDL: 51 mg/dL (ref >39)
LDL Chol Calc (NIH): 96 mg/dL (ref 0–99)
Triglycerides: 165 mg/dL — ABNORMAL HIGH (ref 0–149)
VLDL Cholesterol Cal: 29 mg/dL (ref 5–40)

## 2021-03-30 LAB — VITAMIN D 25 HYDROXY (VIT D DEFICIENCY, FRACTURES): Vit D, 25-Hydroxy: 67.6 ng/mL (ref 30.0–100.0)

## 2021-03-30 LAB — HEMOGLOBIN A1C
Est. average glucose Bld gHb Est-mCnc: 108 mg/dL
Hgb A1c MFr Bld: 5.4 % (ref 4.8–5.6)

## 2021-03-31 ENCOUNTER — Encounter (INDEPENDENT_AMBULATORY_CARE_PROVIDER_SITE_OTHER): Payer: Self-pay | Admitting: Adult Health

## 2021-03-31 DIAGNOSIS — F3189 Other bipolar disorder: Secondary | ICD-10-CM | POA: Diagnosis not present

## 2021-03-31 DIAGNOSIS — F1020 Alcohol dependence, uncomplicated: Secondary | ICD-10-CM | POA: Diagnosis not present

## 2021-03-31 NOTE — Telephone Encounter (Signed)
Pt last seen by Katy Danford, FNP.  

## 2021-04-05 DIAGNOSIS — M542 Cervicalgia: Secondary | ICD-10-CM | POA: Diagnosis not present

## 2021-04-05 DIAGNOSIS — G43719 Chronic migraine without aura, intractable, without status migrainosus: Secondary | ICD-10-CM | POA: Diagnosis not present

## 2021-04-19 DIAGNOSIS — F411 Generalized anxiety disorder: Secondary | ICD-10-CM | POA: Diagnosis not present

## 2021-04-19 DIAGNOSIS — F331 Major depressive disorder, recurrent, moderate: Secondary | ICD-10-CM | POA: Diagnosis not present

## 2021-04-20 DIAGNOSIS — J3089 Other allergic rhinitis: Secondary | ICD-10-CM | POA: Diagnosis not present

## 2021-04-20 DIAGNOSIS — J3081 Allergic rhinitis due to animal (cat) (dog) hair and dander: Secondary | ICD-10-CM | POA: Diagnosis not present

## 2021-04-20 DIAGNOSIS — J301 Allergic rhinitis due to pollen: Secondary | ICD-10-CM | POA: Diagnosis not present

## 2021-05-03 DIAGNOSIS — F411 Generalized anxiety disorder: Secondary | ICD-10-CM | POA: Diagnosis not present

## 2021-05-03 DIAGNOSIS — F331 Major depressive disorder, recurrent, moderate: Secondary | ICD-10-CM | POA: Diagnosis not present

## 2021-05-10 ENCOUNTER — Other Ambulatory Visit: Payer: Self-pay

## 2021-05-10 ENCOUNTER — Ambulatory Visit (INDEPENDENT_AMBULATORY_CARE_PROVIDER_SITE_OTHER): Payer: BC Managed Care – PPO | Admitting: Adult Health

## 2021-05-10 ENCOUNTER — Encounter (INDEPENDENT_AMBULATORY_CARE_PROVIDER_SITE_OTHER): Payer: Self-pay | Admitting: Adult Health

## 2021-05-10 VITALS — BP 110/68 | HR 98 | Temp 97.8°F | Ht 62.0 in | Wt 148.0 lb

## 2021-05-10 DIAGNOSIS — R7303 Prediabetes: Secondary | ICD-10-CM | POA: Diagnosis not present

## 2021-05-10 DIAGNOSIS — E559 Vitamin D deficiency, unspecified: Secondary | ICD-10-CM

## 2021-05-10 DIAGNOSIS — E669 Obesity, unspecified: Secondary | ICD-10-CM | POA: Diagnosis not present

## 2021-05-10 DIAGNOSIS — E781 Pure hyperglyceridemia: Secondary | ICD-10-CM

## 2021-05-10 DIAGNOSIS — Z6830 Body mass index (BMI) 30.0-30.9, adult: Secondary | ICD-10-CM

## 2021-05-10 DIAGNOSIS — Z6827 Body mass index (BMI) 27.0-27.9, adult: Secondary | ICD-10-CM

## 2021-05-11 DIAGNOSIS — J3089 Other allergic rhinitis: Secondary | ICD-10-CM | POA: Diagnosis not present

## 2021-05-11 DIAGNOSIS — J3081 Allergic rhinitis due to animal (cat) (dog) hair and dander: Secondary | ICD-10-CM | POA: Diagnosis not present

## 2021-05-11 DIAGNOSIS — J301 Allergic rhinitis due to pollen: Secondary | ICD-10-CM | POA: Diagnosis not present

## 2021-05-11 NOTE — Progress Notes (Signed)
? ? ? ?Chief Complaint:  ? ?OBESITY ?[name] Park is here to discuss her progress with her obesity treatment plan along with follow-up of her obesity related diagnoses. [name] Park is on keeping a food journal and adhering to recommended goals of 1250-1350 calories and 85 grams of protein and states she is following her eating plan approximately 85-80% of the time. [name] Park states she is walking for 20-25 minutes 2-3 times per week. ? ?Today's visit was #: 25 ?Starting weight: 177 lbs ?Starting date: 10/02/2019 ?Today's weight: 148 lbs ?Today's date: 05/10/2021 ?Total lbs lost to date: 29 lbs ?Total lbs lost since last in-office visit: 0 ? ?Interim History:  ?Schmoker goal is to stay in the 140s - current weight 148/BMI 27. ?Recent decrease in RMR when metabolism was checked at last OV. ?03/27/21 RMR-994 ?Previous RMR on 09/20/20-1699 ?She reports stable hunger levels. ? ?Subjective:  ? ?1. Hypertriglyceridemia ?Discussed labs with patient today.  ?Vascepa 1 gm - 2 caps BID. ?On 03/28/2021, total 176, HDL 51, TG 165!  LDL 96 - great improvement! ?ASCVD risk stratification 0.5% - excellent! ? ?2. Vitamin D deficiency ?Discussed labs with patient today.  ?On OTC vitamin D3 2000 IU daily. ?ON 03/28/2021, vitamin D level - 67.6 - stable. ? ?3. Pre-diabetes ?Discussed labs with patient today.  ?Dr. Talmage Nap provides metformin prescription - metformin 500 mg 2 tablets BID with meals. ?Unable to tolerate Ozempic 0.25 mg - caused abdominal cramping. ?On 03/28/2021, CMP - GFR 11, BG 104.  A1c 5.4, insulin level 21.6. ? ? ?Assessment/Plan:  ? ?1. Hypertriglyceridemia ?Continue Vascepa 1 gm - 2 caps BID. ? ?2. Vitamin D deficiency ?Continue OTC supplement. ? ?3. Pre-diabetes ?Continue metformin per Dr. Talmage Nap. ? ?4. Obesity with current BMI 27.1 ? ?[name] Park is currently in the action stage of change. As such, her goal is to continue with weight loss efforts. She has agreed to keeping a food journal and adhering to recommended goals of 1100-1200 calories  and 75-85 grams of protein.  ? ?Due to slight increase in weight and lack of polyphagia- cal and protein goals slightly decreased. ? ?Check IC at next office visit.  Aware to arrive 30 minutes prior to office visit. ? ?Exercise goals:  As is.  Increase walking to 3-4 times per week. ? ?Behavioral modification strategies: increasing lean protein intake, decreasing simple carbohydrates, meal planning and cooking strategies, keeping healthy foods in the home, and planning for success. ? ?[name] Park has agreed to follow-up with our clinic in 6 weeks. She was informed of the importance of frequent follow-up visits to maximize her success with intensive lifestyle modifications for her multiple health conditions.  ? ?Objective:  ? ?Blood pressure 110/68, pulse 98, temperature 97.8 ?F (36.6 ?C), height 5\' 2"  (1.575 m), weight 148 lb (67.1 kg), SpO2 98 %. ?Body mass index is 27.07 kg/m?. ? ?General: Cooperative, alert, well developed, in no acute distress. ?HEENT: Conjunctivae and lids unremarkable. ?Cardiovascular: Regular rhythm.  ?Lungs: Normal work of breathing. ?Neurologic: No focal deficits.  ? ?Lab Results  ?Component Value Date  ? CREATININE 0.67 03/28/2021  ? BUN 8 03/28/2021  ? NA 138 03/28/2021  ? K 4.3 03/28/2021  ? CL 100 03/28/2021  ? CO2 24 03/28/2021  ? ?Lab Results  ?Component Value Date  ? ALT 10 03/28/2021  ? AST 13 03/28/2021  ? ALKPHOS 88 03/28/2021  ? BILITOT <0.2 03/28/2021  ? ?Lab Results  ?Component Value Date  ? HGBA1C 5.4 03/28/2021  ? HGBA1C 5.7 (H) 11/10/2020  ?  HGBA1C 5.5 07/22/2020  ? HGBA1C 5.4 04/21/2020  ? HGBA1C 5.6 01/14/2020  ? ?Lab Results  ?Component Value Date  ? INSULIN 21.6 03/28/2021  ? INSULIN 15.8 11/10/2020  ? INSULIN 16.3 07/22/2020  ? INSULIN 18.7 04/21/2020  ? INSULIN 28.3 (H) 01/14/2020  ? ?Lab Results  ?Component Value Date  ? TSH 1.850 06/03/2020  ? ?Lab Results  ?Component Value Date  ? CHOL 176 03/28/2021  ? HDL 51 03/28/2021  ? LDLCALC 96 03/28/2021  ? TRIG 165 (H)  03/28/2021  ? CHOLHDL 3.5 03/28/2021  ? ?Lab Results  ?Component Value Date  ? VD25OH 67.6 03/28/2021  ? VD25OH 70.9 11/10/2020  ? VD25OH 59.7 07/22/2020  ? ?Lab Results  ?Component Value Date  ? WBC 8.1 10/02/2019  ? HGB 13.3 10/02/2019  ? HCT 39.1 10/02/2019  ? MCV 89 10/02/2019  ? PLT 306 10/02/2019  ? ?Attestation Statements:  ? ?Reviewed by clinician on day of visit: allergies, medications, problem list, medical history, surgical history, family history, social history, and previous encounter notes. ? ?Time spent on visit including pre-visit chart review and post-visit care and charting was 32 minutes.  ? ?I, Insurance claims handler, CMA, am acting as Energy manager for William Hamburger, NP. ? ?I have reviewed the above documentation for accuracy and completeness, and I agree with the above. -  Donaldson Richter d. Aristotelis Vilardi, NP-C ?

## 2021-05-17 DIAGNOSIS — G43719 Chronic migraine without aura, intractable, without status migrainosus: Secondary | ICD-10-CM | POA: Diagnosis not present

## 2021-05-17 DIAGNOSIS — M542 Cervicalgia: Secondary | ICD-10-CM | POA: Diagnosis not present

## 2021-05-27 DIAGNOSIS — J0141 Acute recurrent pansinusitis: Secondary | ICD-10-CM | POA: Diagnosis not present

## 2021-06-08 DIAGNOSIS — J3081 Allergic rhinitis due to animal (cat) (dog) hair and dander: Secondary | ICD-10-CM | POA: Diagnosis not present

## 2021-06-08 DIAGNOSIS — J301 Allergic rhinitis due to pollen: Secondary | ICD-10-CM | POA: Diagnosis not present

## 2021-06-08 DIAGNOSIS — J3089 Other allergic rhinitis: Secondary | ICD-10-CM | POA: Diagnosis not present

## 2021-06-22 ENCOUNTER — Encounter (INDEPENDENT_AMBULATORY_CARE_PROVIDER_SITE_OTHER): Payer: Self-pay | Admitting: Adult Health

## 2021-06-22 ENCOUNTER — Ambulatory Visit (INDEPENDENT_AMBULATORY_CARE_PROVIDER_SITE_OTHER): Payer: BC Managed Care – PPO | Admitting: Adult Health

## 2021-06-22 VITALS — BP 116/73 | HR 102 | Temp 98.2°F | Ht 62.0 in | Wt 151.0 lb

## 2021-06-22 DIAGNOSIS — Z6827 Body mass index (BMI) 27.0-27.9, adult: Secondary | ICD-10-CM | POA: Diagnosis not present

## 2021-06-22 DIAGNOSIS — E669 Obesity, unspecified: Secondary | ICD-10-CM | POA: Diagnosis not present

## 2021-06-22 DIAGNOSIS — R7303 Prediabetes: Secondary | ICD-10-CM | POA: Diagnosis not present

## 2021-06-22 DIAGNOSIS — R0602 Shortness of breath: Secondary | ICD-10-CM | POA: Diagnosis not present

## 2021-06-23 DIAGNOSIS — F1020 Alcohol dependence, uncomplicated: Secondary | ICD-10-CM | POA: Diagnosis not present

## 2021-06-23 DIAGNOSIS — F3189 Other bipolar disorder: Secondary | ICD-10-CM | POA: Diagnosis not present

## 2021-06-28 DIAGNOSIS — M542 Cervicalgia: Secondary | ICD-10-CM | POA: Diagnosis not present

## 2021-06-28 DIAGNOSIS — G43719 Chronic migraine without aura, intractable, without status migrainosus: Secondary | ICD-10-CM | POA: Diagnosis not present

## 2021-06-29 DIAGNOSIS — J301 Allergic rhinitis due to pollen: Secondary | ICD-10-CM | POA: Diagnosis not present

## 2021-06-29 DIAGNOSIS — J3089 Other allergic rhinitis: Secondary | ICD-10-CM | POA: Diagnosis not present

## 2021-06-29 DIAGNOSIS — J3081 Allergic rhinitis due to animal (cat) (dog) hair and dander: Secondary | ICD-10-CM | POA: Diagnosis not present

## 2021-07-04 NOTE — Progress Notes (Signed)
? ? ? ?Chief Complaint:  ? ?OBESITY ?[name] Park is here to discuss her progress with her obesity treatment plan along with follow-up of her obesity related diagnoses. [name] Park is on keeping a food journal and adhering to recommended goals of 1100-1200 calories and 75 grams of protein and states she is following her eating plan approximately 80% of the time. [name] Park states she is walking 20-25 minutes 3 times per week. ? ?Today's visit was #: 26 ?Starting weight: 177 lbs ?Starting date: 10/02/2019 ?Today's weight: 151 lbs ?Today's date: 06/22/2021 ?Total lbs lost to date: 68 ?Total lbs lost since last in-office visit: 0 ? ?Interim History:  ?RMR 1433 on 10/02/19, RMR 1699 on 09/20/20, and RMR 944 on 03/28/21.  ?Bioimpedant +6.4 lbs, muscle, -3.6 lbs Fat, water stable.  ? ?She has yet to establish with PCP-list of local providers given to pt today. ? ?We will check fasting labs at next office visit.  ? ?[name] Park will travel to [place] in Nov of this year. ? ?Subjective:  ? ?1. SOB (shortness of breath) on exertion ?[name] Park denies dyspnea with extreme exertion. She denies any chest pain with exertion. ? ?2. Pre-diabetes ?[name]'s stopped Ozempic on 02/22/21- due to GI upset. ?She remained on Metformin 500 mg, 2 tabs twice a day with meals- managed by Dr. Balan/Endocrinology. ? ?Assessment/Plan:  ? ?1. SOB (shortness of breath) on exertion ?Check IC. ? ?2. Pre-diabetes ?[name] Park will continue Metformin per Endo/Dr. Talmage Park.  ?We will check labs at next office visit. ? ?3. Obesity with current BMI 27.7 ?[name] Park is currently in the action stage of change. As such, her goal is to continue with weight loss efforts. She has agreed to keeping a food journal and adhering to recommended goals of 1700-1800 calories and 100 grams of  protein daily. ? ?Exercise goals: As is. ? ?Behavioral modification strategies: increasing lean protein intake, decreasing simple carbohydrates, meal planning and cooking strategies, keeping healthy foods in the home, and  planning for success. ? ?[name] Park has agreed to follow-up with our clinic in 6 weeks. She was informed of the importance of frequent follow-up visits to maximize her success with intensive lifestyle modifications for her multiple health conditions.  ? ?Objective:  ? ?Blood pressure 116/73, pulse (!) 102, temperature 98.2 ?F (36.8 ?C), height 5\' 2"  (1.575 m), weight 151 lb (68.5 kg). ?Body mass index is 27.62 kg/m?. ? ?General: Cooperative, alert, well developed, in no acute distress. ?HEENT: Conjunctivae and lids unremarkable. ?Cardiovascular: Regular rhythm.  ?Lungs: Normal work of breathing. ?Neurologic: No focal deficits.  ? ?Lab Results  ?Component Value Date  ? CREATININE 0.67 03/28/2021  ? BUN 8 03/28/2021  ? NA 138 03/28/2021  ? K 4.3 03/28/2021  ? CL 100 03/28/2021  ? CO2 24 03/28/2021  ? ?Lab Results  ?Component Value Date  ? ALT 10 03/28/2021  ? AST 13 03/28/2021  ? ALKPHOS 88 03/28/2021  ? BILITOT <0.2 03/28/2021  ? ?Lab Results  ?Component Value Date  ? HGBA1C 5.4 03/28/2021  ? HGBA1C 5.7 (H) 11/10/2020  ? HGBA1C 5.5 07/22/2020  ? HGBA1C 5.4 04/21/2020  ? HGBA1C 5.6 01/14/2020  ? ?Lab Results  ?Component Value Date  ? INSULIN 21.6 03/28/2021  ? INSULIN 15.8 11/10/2020  ? INSULIN 16.3 07/22/2020  ? INSULIN 18.7 04/21/2020  ? INSULIN 28.3 (H) 01/14/2020  ? ?Lab Results  ?Component Value Date  ? TSH 1.850 06/03/2020  ? ?Lab Results  ?Component Value Date  ? CHOL 176 03/28/2021  ? HDL 51 03/28/2021  ?  LDLCALC 96 03/28/2021  ? TRIG 165 (H) 03/28/2021  ? CHOLHDL 3.5 03/28/2021  ? ?Lab Results  ?Component Value Date  ? VD25OH 67.6 03/28/2021  ? VD25OH 70.9 11/10/2020  ? VD25OH 59.7 07/22/2020  ? ?Lab Results  ?Component Value Date  ? WBC 8.1 10/02/2019  ? HGB 13.3 10/02/2019  ? HCT 39.1 10/02/2019  ? MCV 89 10/02/2019  ? PLT 306 10/02/2019  ? ?No results found for: IRON, TIBC, FERRITIN ? ?Attestation Statements:  ? ?Reviewed by clinician on day of visit: allergies, medications, problem list, medical history,  surgical history, family history, social history, and previous encounter notes. ? ?I, [name] Park, RMA, am acting as transcriptionist for [name] Hamburger, NP. ? ?I have reviewed the above documentation for accuracy and completeness, and I agree with the above. -  [name] Park d. [name] Flenner, NP-C ?

## 2021-07-06 DIAGNOSIS — L089 Local infection of the skin and subcutaneous tissue, unspecified: Secondary | ICD-10-CM | POA: Diagnosis not present

## 2021-07-06 DIAGNOSIS — J019 Acute sinusitis, unspecified: Secondary | ICD-10-CM | POA: Diagnosis not present

## 2021-07-13 DIAGNOSIS — L708 Other acne: Secondary | ICD-10-CM | POA: Diagnosis not present

## 2021-07-13 DIAGNOSIS — L718 Other rosacea: Secondary | ICD-10-CM | POA: Diagnosis not present

## 2021-07-13 DIAGNOSIS — B9561 Methicillin susceptible Staphylococcus aureus infection as the cause of diseases classified elsewhere: Secondary | ICD-10-CM | POA: Diagnosis not present

## 2021-07-13 DIAGNOSIS — L218 Other seborrheic dermatitis: Secondary | ICD-10-CM | POA: Diagnosis not present

## 2021-07-14 DIAGNOSIS — H5213 Myopia, bilateral: Secondary | ICD-10-CM | POA: Diagnosis not present

## 2021-07-14 DIAGNOSIS — H5010 Unspecified exotropia: Secondary | ICD-10-CM | POA: Diagnosis not present

## 2021-07-14 DIAGNOSIS — H53002 Unspecified amblyopia, left eye: Secondary | ICD-10-CM | POA: Diagnosis not present

## 2021-07-14 DIAGNOSIS — H52203 Unspecified astigmatism, bilateral: Secondary | ICD-10-CM | POA: Diagnosis not present

## 2021-07-20 DIAGNOSIS — J3089 Other allergic rhinitis: Secondary | ICD-10-CM | POA: Diagnosis not present

## 2021-07-20 DIAGNOSIS — J3081 Allergic rhinitis due to animal (cat) (dog) hair and dander: Secondary | ICD-10-CM | POA: Diagnosis not present

## 2021-07-20 DIAGNOSIS — J301 Allergic rhinitis due to pollen: Secondary | ICD-10-CM | POA: Diagnosis not present

## 2021-07-28 ENCOUNTER — Ambulatory Visit (INDEPENDENT_AMBULATORY_CARE_PROVIDER_SITE_OTHER): Payer: BC Managed Care – PPO | Admitting: Adult Health

## 2021-07-28 ENCOUNTER — Encounter (INDEPENDENT_AMBULATORY_CARE_PROVIDER_SITE_OTHER): Payer: Self-pay | Admitting: Adult Health

## 2021-07-28 VITALS — BP 103/69 | HR 103 | Temp 97.7°F | Ht 62.0 in | Wt 150.0 lb

## 2021-07-28 DIAGNOSIS — Z9189 Other specified personal risk factors, not elsewhere classified: Secondary | ICD-10-CM

## 2021-07-28 DIAGNOSIS — R7303 Prediabetes: Secondary | ICD-10-CM

## 2021-07-28 DIAGNOSIS — G4733 Obstructive sleep apnea (adult) (pediatric): Secondary | ICD-10-CM

## 2021-07-28 DIAGNOSIS — E669 Obesity, unspecified: Secondary | ICD-10-CM

## 2021-07-28 DIAGNOSIS — Z6827 Body mass index (BMI) 27.0-27.9, adult: Secondary | ICD-10-CM

## 2021-07-28 DIAGNOSIS — E559 Vitamin D deficiency, unspecified: Secondary | ICD-10-CM | POA: Diagnosis not present

## 2021-07-28 DIAGNOSIS — E781 Pure hyperglyceridemia: Secondary | ICD-10-CM

## 2021-07-28 MED ORDER — VASCEPA 1 G PO CAPS: ORAL_CAPSULE | ORAL | 2 refills | Status: DC

## 2021-07-29 LAB — COMPREHENSIVE METABOLIC PANEL
ALT: 12 IU/L (ref 0–32)
AST: 16 IU/L (ref 0–40)
Albumin/Globulin Ratio: 1.7 (ref 1.2–2.2)
Albumin: 4.1 g/dL (ref 3.8–4.8)
Alkaline Phosphatase: 66 IU/L (ref 44–121)
BUN/Creatinine Ratio: 15 (ref 9–23)
BUN: 10 mg/dL (ref 6–24)
Bilirubin Total: <0.2 mg/dL (ref 0.0–1.2)
CO2: 22 mmol/L (ref 20–29)
Calcium: 9.7 mg/dL (ref 8.7–10.2)
Chloride: 100 mmol/L (ref 96–106)
Creatinine, Ser: 0.66 mg/dL (ref 0.57–1.00)
Globulin, Total: 2.4 g/dL (ref 1.5–4.5)
Glucose: 96 mg/dL (ref 70–99)
Potassium: 4.4 mmol/L (ref 3.5–5.2)
Sodium: 138 mmol/L (ref 134–144)
Total Protein: 6.5 g/dL (ref 6.0–8.5)
eGFR: 111 mL/min/{1.73_m2} (ref >59)

## 2021-07-29 LAB — VITAMIN D 25 HYDROXY (VIT D DEFICIENCY, FRACTURES): Vit D, 25-Hydroxy: 62.5 ng/mL (ref 30.0–100.0)

## 2021-07-29 LAB — LIPID PANEL
Chol/HDL Ratio: 3.3 ratio (ref 0.0–4.4)
Cholesterol, Total: 184 mg/dL (ref 100–199)
HDL: 55 mg/dL (ref >39)
LDL Chol Calc (NIH): 97 mg/dL (ref 0–99)
Triglycerides: 187 mg/dL — ABNORMAL HIGH (ref 0–149)
VLDL Cholesterol Cal: 32 mg/dL (ref 5–40)

## 2021-07-29 LAB — HEMOGLOBIN A1C
Est. average glucose Bld gHb Est-mCnc: 111 mg/dL
Hgb A1c MFr Bld: 5.5 % (ref 4.8–5.6)

## 2021-07-29 LAB — VITAMIN B12: Vitamin B-12: 167 pg/mL — ABNORMAL LOW (ref 232–1245)

## 2021-07-29 LAB — INSULIN, RANDOM: INSULIN: 16.8 u[IU]/mL (ref 2.6–24.9)

## 2021-08-02 DIAGNOSIS — F411 Generalized anxiety disorder: Secondary | ICD-10-CM | POA: Diagnosis not present

## 2021-08-02 DIAGNOSIS — F331 Major depressive disorder, recurrent, moderate: Secondary | ICD-10-CM | POA: Diagnosis not present

## 2021-08-02 NOTE — Progress Notes (Unsigned)
Chief Complaint:   OBESITY [name] Park is here to discuss her progress with her obesity treatment plan along with follow-up of her obesity related diagnoses. [name] Park is on keeping a food journal and adhering to recommended goals of 1700-1800 calories and 100 gms protein daily and states she is following her eating plan approximately 80-85% of the time. [name] Park states she is has been sick-not exercising.  Plans on starting back.  Today's visit was #: 44 Starting weight: 177 lbs Starting date: 10/02/2019 Today's weight: 150 lbs Today's date: 07/28/21 Total lbs lost to date: 27 lbs Total lbs lost since last in-office visit: -1  Interim History: Recently experienced sinusitis and skin infection (positive for staph). Antibiotic therapy for 14 days-completed 2 weeks ago.  Subjective:   1. Hypertriglyceridemia She is on Vascepa 1 g - 2 capsules twice daily. She denies tobacco/vape use.  2. Vitamin D deficiency She is on OTC vitamin D3 2000 IU once daily.  3. Pre-diabetes Dr. Kipp Park provides metformin 500 mg-2 tabs twice daily.  4. OSA (obstructive sleep apnea) She has converted from CPAP to mouthguard several years ago.  5. At risk for osteoporosis Related to vitamin D deficiency.  Assessment/Plan:   1. Hypertriglyceridemia Refill: - VASCEPA 1 g capsule; TAKE TWO CAPSULES TWICE DAILY  Dispense: 120 capsule; Refill: 2 Check labs- Lipid panel  2. Vitamin D deficiency Check labs- VITAMIN D 25 Hydroxy (Vit-D Deficiency, Fractures)  3. Pre-diabetes Check labs - Comprehensive metabolic panel - Hemoglobin A1c - Insulin, random - Vitamin B12  4. OSA (obstructive sleep apnea) Continue with healthy eating and nightly mouthguard.  5. At risk for osteoporosis [name] Park was given approximately 15 minutes of osteoporosis prevention counseling today. [name] Park is at risk for osteopenia and osteoporosis due to her Vitamin D deficiency. She was encouraged to take her Vit D and follow her  higher calcium diet and increase strengthening exercise to help strengthen her bones and decrease her risk of osteopenia and osteoporosis.   6. Obesity with current BMI 27.5 [name] Park is currently in the action stage of change. As such, her goal is to continue with weight loss efforts. She has agreed to keeping a food journal and adhering to recommended goals of 1700 calories and 100 gms protein daily.  Exercise goals: as is.  Behavioral modification strategies: increasing lean protein intake, decreasing simple carbohydrates, meal planning and cooking strategies, keeping healthy foods in the home, and planning for success.  [name] Park has agreed to follow-up with our clinic in 6 weeks. She was informed of the importance of frequent follow-up visits to maximize her success with intensive lifestyle modifications for her multiple health conditions.   [name] Park was informed we would discuss her lab results at her next visit unless there is a critical issue that needs to be addressed sooner. [name] Park agreed to keep her next visit at the agreed upon time to discuss these results.  Objective:   Blood pressure 103/69, pulse (!) 103, temperature 97.7 F (36.5 C), height 5\' 2"  (1.575 m), weight 150 lb (68 kg), SpO2 99 %. Body mass index is 27.44 kg/m.  General: Cooperative, alert, well developed, in no acute distress. HEENT: Conjunctivae and lids unremarkable. Cardiovascular: Regular rhythm.  Lungs: Normal work of breathing. Neurologic: No focal deficits.   Lab Results  Component Value Date   CREATININE 0.66 07/28/2021   BUN 10 07/28/2021   NA 138 07/28/2021   K 4.4 07/28/2021   CL 100 07/28/2021   CO2 22 07/28/2021   Lab  Results  Component Value Date   ALT 12 07/28/2021   AST 16 07/28/2021   ALKPHOS 66 07/28/2021   BILITOT <0.2 07/28/2021   Lab Results  Component Value Date   HGBA1C 5.5 07/28/2021   HGBA1C 5.4 03/28/2021   HGBA1C 5.7 (H) 11/10/2020   HGBA1C 5.5 07/22/2020   HGBA1C 5.4  04/21/2020   Lab Results  Component Value Date   INSULIN 16.8 07/28/2021   INSULIN 21.6 03/28/2021   INSULIN 15.8 11/10/2020   INSULIN 16.3 07/22/2020   INSULIN 18.7 04/21/2020   Lab Results  Component Value Date   TSH 1.850 06/03/2020   Lab Results  Component Value Date   CHOL 184 07/28/2021   HDL 55 07/28/2021   LDLCALC 97 07/28/2021   TRIG 187 (H) 07/28/2021   CHOLHDL 3.3 07/28/2021   Lab Results  Component Value Date   VD25OH 62.5 07/28/2021   VD25OH 67.6 03/28/2021   VD25OH 70.9 11/10/2020   Lab Results  Component Value Date   WBC 8.1 10/02/2019   HGB 13.3 10/02/2019   HCT 39.1 10/02/2019   MCV 89 10/02/2019   PLT 306 10/02/2019   No results found for: IRON, TIBC, FERRITIN  Attestation Statements:   Reviewed by clinician on day of visit: allergies, medications, problem list, medical history, surgical history, family history, social history, and previous encounter notes.  I, [name] Fick, FNP, am acting as Location manager for [name] Marble, NP.  I have reviewed the above documentation for accuracy and completeness, and I agree with the above. -  ***

## 2021-08-04 ENCOUNTER — Encounter (INDEPENDENT_AMBULATORY_CARE_PROVIDER_SITE_OTHER): Payer: Self-pay | Admitting: Adult Health

## 2021-08-11 DIAGNOSIS — G43719 Chronic migraine without aura, intractable, without status migrainosus: Secondary | ICD-10-CM | POA: Diagnosis not present

## 2021-08-11 DIAGNOSIS — M542 Cervicalgia: Secondary | ICD-10-CM | POA: Diagnosis not present

## 2021-08-17 DIAGNOSIS — E785 Hyperlipidemia, unspecified: Secondary | ICD-10-CM | POA: Diagnosis not present

## 2021-08-17 DIAGNOSIS — R7301 Impaired fasting glucose: Secondary | ICD-10-CM | POA: Diagnosis not present

## 2021-08-17 DIAGNOSIS — E039 Hypothyroidism, unspecified: Secondary | ICD-10-CM | POA: Diagnosis not present

## 2021-08-24 DIAGNOSIS — R7301 Impaired fasting glucose: Secondary | ICD-10-CM | POA: Diagnosis not present

## 2021-08-24 DIAGNOSIS — E282 Polycystic ovarian syndrome: Secondary | ICD-10-CM | POA: Diagnosis not present

## 2021-08-24 DIAGNOSIS — E039 Hypothyroidism, unspecified: Secondary | ICD-10-CM | POA: Diagnosis not present

## 2021-08-24 DIAGNOSIS — J301 Allergic rhinitis due to pollen: Secondary | ICD-10-CM | POA: Diagnosis not present

## 2021-08-24 DIAGNOSIS — E669 Obesity, unspecified: Secondary | ICD-10-CM | POA: Diagnosis not present

## 2021-08-24 DIAGNOSIS — J3081 Allergic rhinitis due to animal (cat) (dog) hair and dander: Secondary | ICD-10-CM | POA: Diagnosis not present

## 2021-08-24 DIAGNOSIS — J3089 Other allergic rhinitis: Secondary | ICD-10-CM | POA: Diagnosis not present

## 2021-08-26 ENCOUNTER — Encounter (INDEPENDENT_AMBULATORY_CARE_PROVIDER_SITE_OTHER): Payer: Self-pay | Admitting: Adult Health

## 2021-08-31 DIAGNOSIS — J3089 Other allergic rhinitis: Secondary | ICD-10-CM | POA: Diagnosis not present

## 2021-08-31 DIAGNOSIS — J301 Allergic rhinitis due to pollen: Secondary | ICD-10-CM | POA: Diagnosis not present

## 2021-08-31 DIAGNOSIS — J4520 Mild intermittent asthma, uncomplicated: Secondary | ICD-10-CM | POA: Diagnosis not present

## 2021-08-31 DIAGNOSIS — J3081 Allergic rhinitis due to animal (cat) (dog) hair and dander: Secondary | ICD-10-CM | POA: Diagnosis not present

## 2021-09-01 DIAGNOSIS — F331 Major depressive disorder, recurrent, moderate: Secondary | ICD-10-CM | POA: Diagnosis not present

## 2021-09-01 DIAGNOSIS — F411 Generalized anxiety disorder: Secondary | ICD-10-CM | POA: Diagnosis not present

## 2021-09-13 ENCOUNTER — Ambulatory Visit (INDEPENDENT_AMBULATORY_CARE_PROVIDER_SITE_OTHER): Payer: BC Managed Care – PPO | Admitting: Adult Health

## 2021-09-13 ENCOUNTER — Encounter (INDEPENDENT_AMBULATORY_CARE_PROVIDER_SITE_OTHER): Payer: Self-pay | Admitting: Adult Health

## 2021-09-13 VITALS — BP 119/77 | HR 107 | Temp 97.7°F | Ht 62.0 in | Wt 155.0 lb

## 2021-09-13 DIAGNOSIS — E559 Vitamin D deficiency, unspecified: Secondary | ICD-10-CM | POA: Diagnosis not present

## 2021-09-13 DIAGNOSIS — E538 Deficiency of other specified B group vitamins: Secondary | ICD-10-CM

## 2021-09-13 DIAGNOSIS — E781 Pure hyperglyceridemia: Secondary | ICD-10-CM | POA: Diagnosis not present

## 2021-09-13 DIAGNOSIS — R7303 Prediabetes: Secondary | ICD-10-CM

## 2021-09-13 DIAGNOSIS — E669 Obesity, unspecified: Secondary | ICD-10-CM

## 2021-09-13 DIAGNOSIS — Z6828 Body mass index (BMI) 28.0-28.9, adult: Secondary | ICD-10-CM

## 2021-09-13 DIAGNOSIS — Z7984 Long term (current) use of oral hypoglycemic drugs: Secondary | ICD-10-CM

## 2021-09-13 MED ORDER — CYANOCOBALAMIN 500 MCG PO TABS: 500.0000 ug | ORAL_TABLET | Freq: Every day | ORAL | 0 refills | Status: DC

## 2021-09-13 MED ORDER — VASCEPA 1 G PO CAPS: ORAL_CAPSULE | ORAL | 2 refills | Status: DC

## 2021-09-13 NOTE — Progress Notes (Unsigned)
Chief Complaint:   OBESITY [name] Park is here to discuss her progress with her obesity treatment plan along with follow-up of her obesity related diagnoses. [name] Park is on keeping a food journal and adhering to recommended goals of 1700 calories and 100 grams of protein daily and states she is following her eating plan approximately 80-85% of the time. [name] Park states she is walking for 15-20 minutes 2 times per week.  Today's visit was #: 28 Starting weight: 177 lbs Starting date: 10/02/2019 Today's weight: 155 lbs Today's date: 09/13/2021 Total lbs lost to date: 22 Total lbs lost since last in-office visit: 0  Interim History: Denzil has sinusitis episodes and March, April, and June.  She is currently on antibiotic therapy, unsure of the exact prescription.  She is on day 8 of 10.  She denies wheezing or dyspnea.  Subjective:   1. Hypertriglyceridemia [name] Park is on Vascepa 1 mg, 2 capsules twice daily.  On 08/24/2021 Dr. Talmage Nap started her on Crestor 5 mg.  Her last lipid panel was***.  She is not on statin, and her ASCVD risk score is 0.6%, at goal.  I discussed labs with the patient today.  2. Vitamin D deficiency On 07/28/2021, [name] Park's vitamin D level was 62.5.  She is on OTC vitamin D3 2000 units once daily.  I discussed labs with the patient today.  3. Vitamin B 12 deficiency On 07/28/2021, [name] Park's B12 level was***, worsening.  She is not on any B12 supplementations.  She is currently on metformin 500 mg 2 tablets twice daily with meals.  I discussed labs with the patient today.  4. Pre-diabetes On 07/28/2021, [name] Park's blood glucose was***, A1c***, and insulin level***.  Her highest insulin level was 69.5 on 10/02/2019. Dr. Talmage Nap, Endocrinologist provides metformin 500 mg 2 tablets twice daily for polycystic ovarian syndrome and pre-diabetes.  I discussed labs with the patient today.  Assessment/Plan:   1. Hypertriglyceridemia [name] Park will continue the Septra 1 mg 2 capsules twice  daily, and we will refill for 90 days.  - VASCEPA 1 g capsule; TAKE TWO CAPSULES TWICE DAILY  Dispense: 120 capsule; Refill: 2  2. Vitamin D deficiency [name] Park will continue her OTC Vitamin D limitation.  3. Vitamin B 12 deficiency [name] Park agreed to start OTC oral B12 500 mcg daily, and we will refill for 90 days; OTC order was sent in.  - vitamin B-12 (CYANOCOBALAMIN) 500 MCG tablet; Take 1 tablet (500 mcg total) by mouth daily.  Dispense: 90 tablet; Refill: 0  4. Pre-diabetes [name] Park will continue metformin per her endocrinologist, Dr. Talmage Nap.  5. Obesity with current BMI 28.5 [name] Park is currently in the action stage of change. As such, her goal is to continue with weight loss efforts. She has agreed to keeping a food journal and adhering to recommended goals of 1700 calories and 100 grams of protein daily.   Handout: 100 and 200 calorie snack sheet.  Exercise goals: As is.   Behavioral modification strategies: increasing lean protein intake, decreasing simple carbohydrates, meal planning and cooking strategies, keeping healthy foods in the home, and planning for success.  [name] Park has agreed to follow-up with our clinic in 6 weeks. She was informed of the importance of frequent follow-up visits to maximize her success with intensive lifestyle modifications for her multiple health conditions.   Objective:   Blood pressure 119/77, pulse (!) 107, temperature 97.7 F (36.5 C), height 5\' 2"  (1.575 m), weight 155 lb (70.3 kg), SpO2 99 %. Body mass index is  28.35 kg/m.  General: Cooperative, alert, well developed, in no acute distress. HEENT: Conjunctivae and lids unremarkable. Cardiovascular: Regular rhythm.  Lungs: Normal work of breathing. Neurologic: No focal deficits.   Lab Results  Component Value Date   CREATININE 0.66 07/28/2021   BUN 10 07/28/2021   NA 138 07/28/2021   K 4.4 07/28/2021   CL 100 07/28/2021   CO2 22 07/28/2021   Lab Results  Component Value Date   ALT 12  07/28/2021   AST 16 07/28/2021   ALKPHOS 66 07/28/2021   BILITOT <0.2 07/28/2021   Lab Results  Component Value Date   HGBA1C 5.5 07/28/2021   HGBA1C 5.4 03/28/2021   HGBA1C 5.7 (H) 11/10/2020   HGBA1C 5.5 07/22/2020   HGBA1C 5.4 04/21/2020   Lab Results  Component Value Date   INSULIN 16.8 07/28/2021   INSULIN 21.6 03/28/2021   INSULIN 15.8 11/10/2020   INSULIN 16.3 07/22/2020   INSULIN 18.7 04/21/2020   Lab Results  Component Value Date   TSH 1.850 06/03/2020   Lab Results  Component Value Date   CHOL 184 07/28/2021   HDL 55 07/28/2021   LDLCALC 97 07/28/2021   TRIG 187 (H) 07/28/2021   CHOLHDL 3.3 07/28/2021   Lab Results  Component Value Date   VD25OH 62.5 07/28/2021   VD25OH 67.6 03/28/2021   VD25OH 70.9 11/10/2020   Lab Results  Component Value Date   WBC 8.1 10/02/2019   HGB 13.3 10/02/2019   HCT 39.1 10/02/2019   MCV 89 10/02/2019   PLT 306 10/02/2019   No results found for: "IRON", "TIBC", "FERRITIN"  Attestation Statements:   Reviewed by clinician on day of visit: allergies, medications, problem list, medical history, surgical history, family history, social history, and previous encounter notes.   Trude Mcburney, am acting as transcriptionist for William Hamburger, NP.  I have reviewed the above documentation for accuracy and completeness, and I agree with the above. -  ***

## 2021-09-14 DIAGNOSIS — E538 Deficiency of other specified B group vitamins: Secondary | ICD-10-CM | POA: Insufficient documentation

## 2021-09-15 DIAGNOSIS — J3089 Other allergic rhinitis: Secondary | ICD-10-CM | POA: Diagnosis not present

## 2021-09-15 DIAGNOSIS — F331 Major depressive disorder, recurrent, moderate: Secondary | ICD-10-CM | POA: Diagnosis not present

## 2021-09-15 DIAGNOSIS — J301 Allergic rhinitis due to pollen: Secondary | ICD-10-CM | POA: Diagnosis not present

## 2021-09-15 DIAGNOSIS — J3081 Allergic rhinitis due to animal (cat) (dog) hair and dander: Secondary | ICD-10-CM | POA: Diagnosis not present

## 2021-09-15 DIAGNOSIS — F411 Generalized anxiety disorder: Secondary | ICD-10-CM | POA: Diagnosis not present

## 2021-09-20 DIAGNOSIS — M542 Cervicalgia: Secondary | ICD-10-CM | POA: Diagnosis not present

## 2021-09-20 DIAGNOSIS — G43719 Chronic migraine without aura, intractable, without status migrainosus: Secondary | ICD-10-CM | POA: Diagnosis not present

## 2021-09-29 DIAGNOSIS — F411 Generalized anxiety disorder: Secondary | ICD-10-CM | POA: Diagnosis not present

## 2021-09-29 DIAGNOSIS — F331 Major depressive disorder, recurrent, moderate: Secondary | ICD-10-CM | POA: Diagnosis not present

## 2021-10-10 DIAGNOSIS — R21 Rash and other nonspecific skin eruption: Secondary | ICD-10-CM | POA: Diagnosis not present

## 2021-10-11 DIAGNOSIS — F1020 Alcohol dependence, uncomplicated: Secondary | ICD-10-CM | POA: Diagnosis not present

## 2021-10-11 DIAGNOSIS — F3189 Other bipolar disorder: Secondary | ICD-10-CM | POA: Diagnosis not present

## 2021-10-12 ENCOUNTER — Encounter (INDEPENDENT_AMBULATORY_CARE_PROVIDER_SITE_OTHER): Payer: Self-pay

## 2021-10-12 DIAGNOSIS — L853 Xerosis cutis: Secondary | ICD-10-CM | POA: Diagnosis not present

## 2021-10-12 DIAGNOSIS — L309 Dermatitis, unspecified: Secondary | ICD-10-CM | POA: Diagnosis not present

## 2021-10-13 DIAGNOSIS — F331 Major depressive disorder, recurrent, moderate: Secondary | ICD-10-CM | POA: Diagnosis not present

## 2021-10-13 DIAGNOSIS — F411 Generalized anxiety disorder: Secondary | ICD-10-CM | POA: Diagnosis not present

## 2021-10-19 DIAGNOSIS — J3089 Other allergic rhinitis: Secondary | ICD-10-CM | POA: Diagnosis not present

## 2021-10-19 DIAGNOSIS — J301 Allergic rhinitis due to pollen: Secondary | ICD-10-CM | POA: Diagnosis not present

## 2021-10-19 DIAGNOSIS — J3081 Allergic rhinitis due to animal (cat) (dog) hair and dander: Secondary | ICD-10-CM | POA: Diagnosis not present

## 2021-10-25 ENCOUNTER — Ambulatory Visit (INDEPENDENT_AMBULATORY_CARE_PROVIDER_SITE_OTHER): Payer: BC Managed Care – PPO | Admitting: Adult Health

## 2021-10-25 ENCOUNTER — Encounter (INDEPENDENT_AMBULATORY_CARE_PROVIDER_SITE_OTHER): Payer: Self-pay | Admitting: Adult Health

## 2021-10-25 VITALS — BP 120/83 | HR 111 | Temp 98.0°F | Ht 62.0 in | Wt 156.0 lb

## 2021-10-25 DIAGNOSIS — R7303 Prediabetes: Secondary | ICD-10-CM

## 2021-10-25 DIAGNOSIS — E669 Obesity, unspecified: Secondary | ICD-10-CM | POA: Diagnosis not present

## 2021-10-25 DIAGNOSIS — Z730 Burn-out: Secondary | ICD-10-CM

## 2021-10-25 DIAGNOSIS — Z7984 Long term (current) use of oral hypoglycemic drugs: Secondary | ICD-10-CM

## 2021-10-25 DIAGNOSIS — Z6828 Body mass index (BMI) 28.0-28.9, adult: Secondary | ICD-10-CM

## 2021-10-27 DIAGNOSIS — F411 Generalized anxiety disorder: Secondary | ICD-10-CM | POA: Diagnosis not present

## 2021-10-27 DIAGNOSIS — F331 Major depressive disorder, recurrent, moderate: Secondary | ICD-10-CM | POA: Diagnosis not present

## 2021-10-28 DIAGNOSIS — Z730 Burn-out: Secondary | ICD-10-CM | POA: Insufficient documentation

## 2021-10-28 NOTE — Progress Notes (Unsigned)
Chief Complaint:   OBESITY [name] Park is here to discuss her progress with her obesity treatment plan along with follow-up of her obesity related diagnoses. [name] Park is on keeping a food journal and adhering to recommended goals of 1700 calories and 100 protein and states she is following her eating plan approximately 75% of the time. [name] Park states she is walking, but has been sick with hand, foot and mouth disease 15-20 minutes 2 times per week.  Today's visit was #: 29 Starting weight: 177 lbs Starting date: 10/02/2019 Today's weight: 156 lbs Today's date: 10/25/2021 Total lbs lost to date: 21 lbs Total lbs lost since last in-office visit: +1 lb  Interim History: Two weeks ago she contracted hand, foot, and mouth disease from work. [employer].  She believes 2 children in her room had the disease. Treatment prednisone, Xyzal.  Hands, feet, face, chin, elbows.   Subjective:   1. Pre-diabetes Dr Talmage Nap provides Metformin 500 mg, 2 tablets with breakfast and 2 tablets at dinner  2. 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 Has not had a break from work in months. She estimates to work 20-25 hours per week.  She reports increased migraine headaches with increased stress.    Assessment/Plan:   1. Pre-diabetes Continue journaling plan and metformin.   2. Burn-out Increase regular walking.   3. Obesity with current BMI 28.5 Fasting labs at next office visit.  [name] Park is currently in the action stage of change. As such, her goal is to continue with weight loss efforts. She has agreed to keeping a food journal and adhering to recommended goals of 1700-1900 calories and 100 protein daily.  Exercise goals:  As is.   Behavioral modification strategies: increasing lean protein intake, decreasing simple carbohydrates, meal planning and cooking strategies, keeping healthy foods in the home, and planning for success.  [name] Park has agreed to follow-up with  our clinic in 4 weeks FASTING. She was informed of the importance of frequent follow-up visits to maximize her success with intensive lifestyle modifications for her multiple health conditions.   Objective:   Blood pressure 120/83, pulse (!) 111, temperature 98 F (36.7 C), height 5\' 2"  (1.575 m), weight 156 lb (70.8 kg), SpO2 97 %. Body mass index is 28.53 kg/m.  General: Cooperative, alert, well developed, in no acute distress. HEENT: Conjunctivae and lids unremarkable. Cardiovascular: Regular rhythm.  Lungs: Normal work of breathing. Neurologic: No focal deficits.   Lab Results  Component Value Date   CREATININE 0.66 07/28/2021   BUN 10 07/28/2021   NA 138 07/28/2021   K 4.4 07/28/2021   CL 100 07/28/2021   CO2 22 07/28/2021   Lab Results  Component Value Date   ALT 12 07/28/2021   AST 16 07/28/2021   ALKPHOS 66 07/28/2021   BILITOT <0.2 07/28/2021   Lab Results  Component Value Date   HGBA1C 5.5 07/28/2021   HGBA1C 5.4 03/28/2021   HGBA1C 5.7 (H) 11/10/2020   HGBA1C 5.5 07/22/2020   HGBA1C 5.4 04/21/2020   Lab Results  Component Value Date   INSULIN 16.8 07/28/2021   INSULIN 21.6 03/28/2021   INSULIN 15.8 11/10/2020   INSULIN 16.3 07/22/2020   INSULIN 18.7 04/21/2020   Lab Results  Component Value Date   TSH 1.850 06/03/2020   Lab Results  Component Value Date   CHOL 184 07/28/2021   HDL 55 07/28/2021   LDLCALC 97 07/28/2021   TRIG 187 (H) 07/28/2021  CHOLHDL 3.3 07/28/2021   Lab Results  Component Value Date   VD25OH 62.5 07/28/2021   VD25OH 67.6 03/28/2021   VD25OH 70.9 11/10/2020   Lab Results  Component Value Date   WBC 8.1 10/02/2019   HGB 13.3 10/02/2019   HCT 39.1 10/02/2019   MCV 89 10/02/2019   PLT 306 10/02/2019   No results found for: "IRON", "TIBC", "FERRITIN"  Attestation Statements:   Reviewed by clinician on day of visit: allergies, medications, problem list, medical history, surgical history, family history, social  history, and previous encounter notes.  Time spent on visit including pre-visit chart review and post-visit care and charting was 27 minutes.   I, Malcolm Metro, RMA, am acting as Energy manager for William Hamburger, NP.  I have reviewed the above documentation for accuracy and completeness, and I agree with the above. -  ***

## 2021-11-09 DIAGNOSIS — Z01419 Encounter for gynecological examination (general) (routine) without abnormal findings: Secondary | ICD-10-CM | POA: Diagnosis not present

## 2021-11-09 DIAGNOSIS — Z6829 Body mass index (BMI) 29.0-29.9, adult: Secondary | ICD-10-CM | POA: Diagnosis not present

## 2021-11-09 DIAGNOSIS — N760 Acute vaginitis: Secondary | ICD-10-CM | POA: Diagnosis not present

## 2021-11-09 DIAGNOSIS — Z1231 Encounter for screening mammogram for malignant neoplasm of breast: Secondary | ICD-10-CM | POA: Diagnosis not present

## 2021-11-10 DIAGNOSIS — F331 Major depressive disorder, recurrent, moderate: Secondary | ICD-10-CM | POA: Diagnosis not present

## 2021-11-10 DIAGNOSIS — F411 Generalized anxiety disorder: Secondary | ICD-10-CM | POA: Diagnosis not present

## 2021-11-14 DIAGNOSIS — R109 Unspecified abdominal pain: Secondary | ICD-10-CM | POA: Diagnosis not present

## 2021-11-14 DIAGNOSIS — R197 Diarrhea, unspecified: Secondary | ICD-10-CM | POA: Diagnosis not present

## 2021-11-14 DIAGNOSIS — E860 Dehydration: Secondary | ICD-10-CM | POA: Diagnosis not present

## 2021-11-22 DIAGNOSIS — J3081 Allergic rhinitis due to animal (cat) (dog) hair and dander: Secondary | ICD-10-CM | POA: Diagnosis not present

## 2021-11-22 DIAGNOSIS — Z23 Encounter for immunization: Secondary | ICD-10-CM | POA: Diagnosis not present

## 2021-11-22 DIAGNOSIS — J3089 Other allergic rhinitis: Secondary | ICD-10-CM | POA: Diagnosis not present

## 2021-11-22 DIAGNOSIS — M542 Cervicalgia: Secondary | ICD-10-CM | POA: Diagnosis not present

## 2021-11-22 DIAGNOSIS — G43719 Chronic migraine without aura, intractable, without status migrainosus: Secondary | ICD-10-CM | POA: Diagnosis not present

## 2021-11-22 DIAGNOSIS — J301 Allergic rhinitis due to pollen: Secondary | ICD-10-CM | POA: Diagnosis not present

## 2021-11-23 ENCOUNTER — Ambulatory Visit (INDEPENDENT_AMBULATORY_CARE_PROVIDER_SITE_OTHER): Payer: BC Managed Care – PPO | Admitting: Adult Health

## 2021-11-23 ENCOUNTER — Encounter (INDEPENDENT_AMBULATORY_CARE_PROVIDER_SITE_OTHER): Payer: Self-pay | Admitting: Adult Health

## 2021-11-23 VITALS — BP 107/71 | HR 91 | Temp 97.5°F | Ht 62.0 in | Wt 154.0 lb

## 2021-11-23 DIAGNOSIS — E781 Pure hyperglyceridemia: Secondary | ICD-10-CM

## 2021-11-23 DIAGNOSIS — E559 Vitamin D deficiency, unspecified: Secondary | ICD-10-CM | POA: Diagnosis not present

## 2021-11-23 DIAGNOSIS — Z6830 Body mass index (BMI) 30.0-30.9, adult: Secondary | ICD-10-CM

## 2021-11-23 DIAGNOSIS — R7303 Prediabetes: Secondary | ICD-10-CM | POA: Diagnosis not present

## 2021-11-23 DIAGNOSIS — E538 Deficiency of other specified B group vitamins: Secondary | ICD-10-CM | POA: Diagnosis not present

## 2021-11-23 DIAGNOSIS — Z6828 Body mass index (BMI) 28.0-28.9, adult: Secondary | ICD-10-CM

## 2021-11-23 DIAGNOSIS — Z9189 Other specified personal risk factors, not elsewhere classified: Secondary | ICD-10-CM

## 2021-11-23 DIAGNOSIS — E669 Obesity, unspecified: Secondary | ICD-10-CM

## 2021-11-24 ENCOUNTER — Encounter (INDEPENDENT_AMBULATORY_CARE_PROVIDER_SITE_OTHER): Payer: Self-pay | Admitting: Adult Health

## 2021-11-24 DIAGNOSIS — F411 Generalized anxiety disorder: Secondary | ICD-10-CM | POA: Diagnosis not present

## 2021-11-24 DIAGNOSIS — F331 Major depressive disorder, recurrent, moderate: Secondary | ICD-10-CM | POA: Diagnosis not present

## 2021-11-24 LAB — HEMOGLOBIN A1C
Est. average glucose Bld gHb Est-mCnc: 111 mg/dL
Hgb A1c MFr Bld: 5.5 % (ref 4.8–5.6)

## 2021-11-24 LAB — COMPREHENSIVE METABOLIC PANEL
ALT: 11 IU/L (ref 0–32)
AST: 15 IU/L (ref 0–40)
Albumin/Globulin Ratio: 1.5 (ref 1.2–2.2)
Albumin: 4.1 g/dL (ref 3.9–4.9)
Alkaline Phosphatase: 64 IU/L (ref 44–121)
BUN/Creatinine Ratio: 18 (ref 9–23)
BUN: 13 mg/dL (ref 6–24)
Bilirubin Total: 0.2 mg/dL (ref 0.0–1.2)
CO2: 22 mmol/L (ref 20–29)
Calcium: 9.7 mg/dL (ref 8.7–10.2)
Chloride: 102 mmol/L (ref 96–106)
Creatinine, Ser: 0.71 mg/dL (ref 0.57–1.00)
Globulin, Total: 2.7 g/dL (ref 1.5–4.5)
Glucose: 99 mg/dL (ref 70–99)
Potassium: 4.4 mmol/L (ref 3.5–5.2)
Sodium: 141 mmol/L (ref 134–144)
Total Protein: 6.8 g/dL (ref 6.0–8.5)
eGFR: 107 mL/min/{1.73_m2} (ref >59)

## 2021-11-24 LAB — INSULIN, RANDOM: INSULIN: 62.3 u[IU]/mL — ABNORMAL HIGH (ref 2.6–24.9)

## 2021-11-24 LAB — LIPID PANEL
Chol/HDL Ratio: 2.3 ratio (ref 0.0–4.4)
Cholesterol, Total: 109 mg/dL (ref 100–199)
HDL: 48 mg/dL (ref >39)
LDL Chol Calc (NIH): 35 mg/dL (ref 0–99)
Triglycerides: 153 mg/dL — ABNORMAL HIGH (ref 0–149)
VLDL Cholesterol Cal: 26 mg/dL (ref 5–40)

## 2021-11-24 LAB — VITAMIN D 25 HYDROXY (VIT D DEFICIENCY, FRACTURES): Vit D, 25-Hydroxy: 54.3 ng/mL (ref 30.0–100.0)

## 2021-11-24 LAB — VITAMIN B12: Vitamin B-12: 644 pg/mL (ref 232–1245)

## 2021-11-26 MED ORDER — CYANOCOBALAMIN 500 MCG PO TABS: 500.0000 ug | ORAL_TABLET | Freq: Every day | ORAL | 0 refills | Status: DC

## 2021-11-26 NOTE — Progress Notes (Unsigned)
Chief Complaint:   OBESITY [name] Park is here to discuss her progress with her obesity treatment plan along with follow-up of her obesity related diagnoses. [name] is on keeping a food journal and adhering to recommended goals of 1700-1900 calories and 100 protein and states she is following her eating plan approximately 75% of the time. [name] states she is walking 15-20 minutes 1-2 times per week.  Today's visit was #: 30 Starting weight: 177 lbs Starting date: 10/02/2019 Today's weight: 154 lbs Today's date: 11/23/2021 Total lbs lost to date: 23 lbs Total lbs lost since last in-office visit: 2 lbs  Interim History: Since last office visit, she has had an URI for one week.  Gastritis  for one week, diarrhea with abdominal cramping.  She has been unable to exercise on a regular basis due to acute illness.    Subjective:   1. Hypertriglyceridemia She is on Vascepa 1 gram, 2 capsules BID, she is tolerating well.   2. Vitamin B 12 deficiency She is on daily Cyanocobalamin 500 mg daily.  She has not noticed any improvement in energy level.     3. Pre-diabetes Dr Talmage Nap, Endocrinology provides metformin 500 mg, 2 tablets BID.  4. Vitamin D deficiency She is on OTC Vitamin D-3, 2,000 IU daily. She has not noticed any improvement in energy level.     5. At risk for diarrhea Sharmaine is at higher risk of diarrhea due to medications and diet and metformin for prediabetes.   Assessment/Plan:   1. Hypertriglyceridemia Check labs  - Comprehensive metabolic panel - Lipid panel  2. Vitamin B 12 deficiency Refill - cyanocobalamin (VITAMIN B12) 500 MCG tablet; Take 1 tablet (500 mcg total) by mouth daily.  Dispense: 90 tablet; Refill: 0  Check labs - Vitamin B12  3. Pre-diabetes Continue metformin per Dr Talmage Nap  Check labs  - Hemoglobin A1c - Insulin, random  4. Vitamin D deficiency Check labs - VITAMIN D 25 Hydroxy (Vit-D Deficiency, Fractures)  5. At risk for  diarrhea [name] Park was given approximately 15 minutes of diarrhea prevention counseling today. She is 46 y.o. female and has risk factors for diarrhea including medications and changes in diet. We discussed intensive lifestyle modifications today with an emphasis on specific weight loss instructions including dietary strategies.   Repetitive spaced learning was employed today to elicit superior memory formation and behavioral change.   6. Obesity with current BMI 28.2 Mask up at work if able.   Cierrah is currently in the action stage of change. As such, her goal is to continue with weight loss efforts. She has agreed to keeping a food journal and adhering to recommended goals of 1700-1800 calories and 100 protein daily.   Exercise goals:  As is.   Behavioral modification strategies: increasing lean protein intake, decreasing simple carbohydrates, increasing water intake, meal planning and cooking strategies, keeping healthy foods in the home, and planning for success.  Bethany has agreed to follow-up with our clinic in 4 weeks. She was informed of the importance of frequent follow-up visits to maximize her success with intensive lifestyle modifications for her multiple health conditions.   Sherial was informed we would discuss her lab results at her next visit unless there is a critical issue that needs to be addressed sooner. Wynema agreed to keep her next visit at the agreed upon time to discuss these results.  Objective:   Blood pressure 107/71, pulse 91, temperature (!) 97.5 F (36.4 C), height 5\' 2"  (1.575 m),  weight 154 lb (69.9 kg), SpO2 97 %. Body mass index is 28.17 kg/m.  General: Cooperative, alert, well developed, in no acute distress. HEENT: Conjunctivae and lids unremarkable. Cardiovascular: Regular rhythm.  Lungs: Normal work of breathing. Neurologic: No focal deficits.   Lab Results  Component Value Date   CREATININE 0.71 11/23/2021   BUN 13 11/23/2021   NA 141  11/23/2021   K 4.4 11/23/2021   CL 102 11/23/2021   CO2 22 11/23/2021   Lab Results  Component Value Date   ALT 11 11/23/2021   AST 15 11/23/2021   ALKPHOS 64 11/23/2021   BILITOT 0.2 11/23/2021   Lab Results  Component Value Date   HGBA1C 5.5 11/23/2021   HGBA1C 5.5 07/28/2021   HGBA1C 5.4 03/28/2021   HGBA1C 5.7 (H) 11/10/2020   HGBA1C 5.5 07/22/2020   Lab Results  Component Value Date   INSULIN 62.3 (H) 11/23/2021   INSULIN 16.8 07/28/2021   INSULIN 21.6 03/28/2021   INSULIN 15.8 11/10/2020   INSULIN 16.3 07/22/2020   Lab Results  Component Value Date   TSH 1.850 06/03/2020   Lab Results  Component Value Date   CHOL 109 11/23/2021   HDL 48 11/23/2021   LDLCALC 35 11/23/2021   TRIG 153 (H) 11/23/2021   CHOLHDL 2.3 11/23/2021   Lab Results  Component Value Date   VD25OH 54.3 11/23/2021   VD25OH 62.5 07/28/2021   VD25OH 67.6 03/28/2021   Lab Results  Component Value Date   WBC 8.1 10/02/2019   HGB 13.3 10/02/2019   HCT 39.1 10/02/2019   MCV 89 10/02/2019   PLT 306 10/02/2019   No results found for: "IRON", "TIBC", "FERRITIN"  Attestation Statements:   Reviewed by clinician on day of visit: allergies, medications, problem list, medical history, surgical history, family history, social history, and previous encounter notes.  I, Davy Pique, RMA, am acting as Location manager for Mina Marble, NP.  I have reviewed the above documentation for accuracy and completeness, and I agree with the above. -  ***

## 2021-12-08 DIAGNOSIS — F411 Generalized anxiety disorder: Secondary | ICD-10-CM | POA: Diagnosis not present

## 2021-12-08 DIAGNOSIS — F331 Major depressive disorder, recurrent, moderate: Secondary | ICD-10-CM | POA: Diagnosis not present

## 2021-12-15 DIAGNOSIS — J3089 Other allergic rhinitis: Secondary | ICD-10-CM | POA: Diagnosis not present

## 2021-12-15 DIAGNOSIS — J3081 Allergic rhinitis due to animal (cat) (dog) hair and dander: Secondary | ICD-10-CM | POA: Diagnosis not present

## 2021-12-15 DIAGNOSIS — J301 Allergic rhinitis due to pollen: Secondary | ICD-10-CM | POA: Diagnosis not present

## 2021-12-22 DIAGNOSIS — F331 Major depressive disorder, recurrent, moderate: Secondary | ICD-10-CM | POA: Diagnosis not present

## 2021-12-22 DIAGNOSIS — F411 Generalized anxiety disorder: Secondary | ICD-10-CM | POA: Diagnosis not present

## 2021-12-27 ENCOUNTER — Ambulatory Visit (INDEPENDENT_AMBULATORY_CARE_PROVIDER_SITE_OTHER): Payer: BC Managed Care – PPO | Admitting: Adult Health

## 2021-12-27 ENCOUNTER — Encounter (INDEPENDENT_AMBULATORY_CARE_PROVIDER_SITE_OTHER): Payer: Self-pay | Admitting: Adult Health

## 2021-12-27 VITALS — BP 126/85 | HR 98 | Temp 97.8°F | Ht 62.0 in | Wt 158.0 lb

## 2021-12-27 DIAGNOSIS — E559 Vitamin D deficiency, unspecified: Secondary | ICD-10-CM

## 2021-12-27 DIAGNOSIS — R7303 Prediabetes: Secondary | ICD-10-CM

## 2021-12-27 DIAGNOSIS — E538 Deficiency of other specified B group vitamins: Secondary | ICD-10-CM

## 2021-12-27 DIAGNOSIS — E781 Pure hyperglyceridemia: Secondary | ICD-10-CM

## 2021-12-27 DIAGNOSIS — Z6829 Body mass index (BMI) 29.0-29.9, adult: Secondary | ICD-10-CM

## 2021-12-27 DIAGNOSIS — E669 Obesity, unspecified: Secondary | ICD-10-CM

## 2021-12-27 MED ORDER — VASCEPA 1 G PO CAPS: ORAL_CAPSULE | ORAL | 2 refills | Status: DC

## 2021-12-30 NOTE — Progress Notes (Unsigned)
Chief Complaint:   OBESITY Kellis is here to discuss her progress with her obesity treatment plan along with follow-up of her obesity related diagnoses. Misheel is on keeping a food journal and adhering to recommended goals of 1700-1800 calories and 100 protein and states she is following her eating plan approximately 80% of the time. Casie states she is walking 15-20 minutes 1-2 times per week.  Today's visit was #: 31 Starting weight: 177 lbs Starting date: 10/02/2019 Today's weight: 158 lbs Today's date: 12/27/2021 Total lbs lost to date: 19 lbs Total lbs lost since last in-office visit: +4 lbs  Interim History: She will be travelling to [place] for Thanksgiving holiday.  07/28/2021, lipid panel, Dr Horald Pollen started her on Crestor 5 mg, June 2023.  She travelled to [place], [place] at the end of September, she ate off plan.  Bioimpedance results:  muscle mass +2.4 lbs, adipose mass +1.4 lbs.    Subjective:   1. Hypertriglyceridemia Discussed labs with patient today. She is currently on Vascepa 1 gram, 2 capsules BID, denies any side effects.  [facility], 08/24/2021, Endocrinology, Dr Talmage Nap.  Started her on Crestor 5 mg daily.  She denies myalgias.   2. Vitamin B 12 deficiency Discussed labs with patient today. 11/23/2021 B12 level 644 at goal.  She is on oral B12 500 mcg daily.   3. Vitamin D deficiency Discussed labs with patient today. She is on OTC Vitamin D3, 2,000 IU daily.  11/23/2021, Vitamin D level 62.5 stable, at goal.   4. Prediabetes A1c *** 11/23/2021, blood glucose 99, insulin level 62.3 (range 69.5-15.8***) Dr Talmage Nap manages metformin 500 mg 2 tablets BID.  Dr Talmage Nap last office visit 08/2021, lists her as diabetic, however, I cannot find an A1c >6.5 in Epic?  Assessment/Plan:   1. Hypertriglyceridemia Continue with weight loss efforts and current dose of Vascepa.  Follow up with Dr Talmage Nap, statin therapy.   Refill - VASCEPA 1 g capsule; TAKE TWO CAPSULES TWICE DAILY   Dispense: 120 capsule; Refill: 2  2. Vitamin B 12 deficiency Continue daily B12 supplement.   3. Vitamin D deficiency Continue current OTC Vitamin D supplement.   4. Prediabetes Continue metformin therapy per Dr Talmage Nap.  Of note she was briefly on Saxenda then insurance would not continue to cover.  Ozempic caused abdominal pain.   5. Obesity with current BMI 29.0 Encouraged to establish with PCP.  A list of local [facility] providers given to the patient.   Tanee is currently in the action stage of change. As such, her goal is to continue with weight loss efforts. She has agreed to keeping a food journal and adhering to recommended goals of 1700-1800 calories and 100 protein daily.    Exercise goals:  increase walking time and frequency.   Behavioral modification strategies: increasing lean protein intake, decreasing simple carbohydrates, meal planning and cooking strategies, keeping healthy foods in the home, travel eating strategies, and planning for success.  Nahomy has agreed to follow-up with our clinic in 6 weeks. She was informed of the importance of frequent follow-up visits to maximize her success with intensive lifestyle modifications for her multiple health conditions.   Objective:   Blood pressure 126/85, pulse 98, temperature 97.8 F (36.6 C), height 5\' 2"  (1.575 m), weight 158 lb (71.7 kg), SpO2 93 %. Body mass index is 28.9 kg/m.  General: Cooperative, alert, well developed, in no acute distress. HEENT: Conjunctivae and lids unremarkable. Cardiovascular: Regular rhythm.  Lungs: Normal work of breathing.  Neurologic: No focal deficits.   Lab Results  Component Value Date   CREATININE 0.71 11/23/2021   BUN 13 11/23/2021   NA 141 11/23/2021   K 4.4 11/23/2021   CL 102 11/23/2021   CO2 22 11/23/2021   Lab Results  Component Value Date   ALT 11 11/23/2021   AST 15 11/23/2021   ALKPHOS 64 11/23/2021   BILITOT 0.2 11/23/2021   Lab Results  Component Value Date    HGBA1C 5.5 11/23/2021   HGBA1C 5.5 07/28/2021   HGBA1C 5.4 03/28/2021   HGBA1C 5.7 (H) 11/10/2020   HGBA1C 5.5 07/22/2020   Lab Results  Component Value Date   INSULIN 62.3 (H) 11/23/2021   INSULIN 16.8 07/28/2021   INSULIN 21.6 03/28/2021   INSULIN 15.8 11/10/2020   INSULIN 16.3 07/22/2020   Lab Results  Component Value Date   TSH 1.850 06/03/2020   Lab Results  Component Value Date   CHOL 109 11/23/2021   HDL 48 11/23/2021   LDLCALC 35 11/23/2021   TRIG 153 (H) 11/23/2021   CHOLHDL 2.3 11/23/2021   Lab Results  Component Value Date   VD25OH 54.3 11/23/2021   VD25OH 62.5 07/28/2021   VD25OH 67.6 03/28/2021   Lab Results  Component Value Date   WBC 8.1 10/02/2019   HGB 13.3 10/02/2019   HCT 39.1 10/02/2019   MCV 89 10/02/2019   PLT 306 10/02/2019   No results found for: "IRON", "TIBC", "FERRITIN"  Attestation Statements:   Reviewed by clinician on day of visit: allergies, medications, problem list, medical history, surgical history, family history, social history, and previous encounter notes.  I, Davy Pique, RMA, am acting as Location manager for Mina Marble, NP.  I have reviewed the above documentation for accuracy and completeness, and I agree with the above. -  ***

## 2022-01-03 DIAGNOSIS — G43719 Chronic migraine without aura, intractable, without status migrainosus: Secondary | ICD-10-CM | POA: Diagnosis not present

## 2022-01-03 DIAGNOSIS — M542 Cervicalgia: Secondary | ICD-10-CM | POA: Diagnosis not present

## 2022-01-05 DIAGNOSIS — F411 Generalized anxiety disorder: Secondary | ICD-10-CM | POA: Diagnosis not present

## 2022-01-05 DIAGNOSIS — F331 Major depressive disorder, recurrent, moderate: Secondary | ICD-10-CM | POA: Diagnosis not present

## 2022-01-11 DIAGNOSIS — J3089 Other allergic rhinitis: Secondary | ICD-10-CM | POA: Diagnosis not present

## 2022-01-11 DIAGNOSIS — J3081 Allergic rhinitis due to animal (cat) (dog) hair and dander: Secondary | ICD-10-CM | POA: Diagnosis not present

## 2022-01-11 DIAGNOSIS — J301 Allergic rhinitis due to pollen: Secondary | ICD-10-CM | POA: Diagnosis not present

## 2022-01-17 DIAGNOSIS — L700 Acne vulgaris: Secondary | ICD-10-CM | POA: Diagnosis not present

## 2022-01-17 DIAGNOSIS — B001 Herpesviral vesicular dermatitis: Secondary | ICD-10-CM | POA: Diagnosis not present

## 2022-01-17 DIAGNOSIS — J3081 Allergic rhinitis due to animal (cat) (dog) hair and dander: Secondary | ICD-10-CM | POA: Diagnosis not present

## 2022-01-17 DIAGNOSIS — J301 Allergic rhinitis due to pollen: Secondary | ICD-10-CM | POA: Diagnosis not present

## 2022-01-17 DIAGNOSIS — L718 Other rosacea: Secondary | ICD-10-CM | POA: Diagnosis not present

## 2022-01-17 DIAGNOSIS — L218 Other seborrheic dermatitis: Secondary | ICD-10-CM | POA: Diagnosis not present

## 2022-01-18 DIAGNOSIS — J3089 Other allergic rhinitis: Secondary | ICD-10-CM | POA: Diagnosis not present

## 2022-01-19 DIAGNOSIS — F331 Major depressive disorder, recurrent, moderate: Secondary | ICD-10-CM | POA: Diagnosis not present

## 2022-01-19 DIAGNOSIS — F411 Generalized anxiety disorder: Secondary | ICD-10-CM | POA: Diagnosis not present

## 2022-01-23 ENCOUNTER — Telehealth (INDEPENDENT_AMBULATORY_CARE_PROVIDER_SITE_OTHER): Payer: Self-pay

## 2022-01-23 NOTE — Telephone Encounter (Signed)
PA submitted for Vascepa 1GM capsules [id number] Awaiting response.

## 2022-02-01 DIAGNOSIS — F1020 Alcohol dependence, uncomplicated: Secondary | ICD-10-CM | POA: Diagnosis not present

## 2022-02-01 DIAGNOSIS — F3189 Other bipolar disorder: Secondary | ICD-10-CM | POA: Diagnosis not present

## 2022-02-02 DIAGNOSIS — F331 Major depressive disorder, recurrent, moderate: Secondary | ICD-10-CM | POA: Diagnosis not present

## 2022-02-02 DIAGNOSIS — F411 Generalized anxiety disorder: Secondary | ICD-10-CM | POA: Diagnosis not present

## 2022-02-09 DIAGNOSIS — J3089 Other allergic rhinitis: Secondary | ICD-10-CM | POA: Diagnosis not present

## 2022-02-09 DIAGNOSIS — J3081 Allergic rhinitis due to animal (cat) (dog) hair and dander: Secondary | ICD-10-CM | POA: Diagnosis not present

## 2022-02-09 DIAGNOSIS — J301 Allergic rhinitis due to pollen: Secondary | ICD-10-CM | POA: Diagnosis not present

## 2022-02-14 ENCOUNTER — Encounter (INDEPENDENT_AMBULATORY_CARE_PROVIDER_SITE_OTHER): Payer: Self-pay | Admitting: Family Medicine

## 2022-02-14 ENCOUNTER — Ambulatory Visit (INDEPENDENT_AMBULATORY_CARE_PROVIDER_SITE_OTHER): Payer: BC Managed Care – PPO | Admitting: Family Medicine

## 2022-02-14 VITALS — BP 125/76 | HR 118 | Temp 98.3°F | Ht 62.0 in | Wt 157.0 lb

## 2022-02-14 DIAGNOSIS — E669 Obesity, unspecified: Secondary | ICD-10-CM

## 2022-02-14 DIAGNOSIS — R7303 Prediabetes: Secondary | ICD-10-CM

## 2022-02-14 DIAGNOSIS — E559 Vitamin D deficiency, unspecified: Secondary | ICD-10-CM | POA: Diagnosis not present

## 2022-02-14 DIAGNOSIS — Z6828 Body mass index (BMI) 28.0-28.9, adult: Secondary | ICD-10-CM

## 2022-02-14 DIAGNOSIS — E538 Deficiency of other specified B group vitamins: Secondary | ICD-10-CM

## 2022-02-14 DIAGNOSIS — Z6830 Body mass index (BMI) 30.0-30.9, adult: Secondary | ICD-10-CM

## 2022-02-14 MED ORDER — CYANOCOBALAMIN 500 MCG PO TABS: 500.0000 ug | ORAL_TABLET | Freq: Every day | ORAL | 0 refills | Status: DC

## 2022-02-15 DIAGNOSIS — J301 Allergic rhinitis due to pollen: Secondary | ICD-10-CM | POA: Diagnosis not present

## 2022-02-15 DIAGNOSIS — J3081 Allergic rhinitis due to animal (cat) (dog) hair and dander: Secondary | ICD-10-CM | POA: Diagnosis not present

## 2022-02-15 DIAGNOSIS — J3089 Other allergic rhinitis: Secondary | ICD-10-CM | POA: Diagnosis not present

## 2022-02-16 DIAGNOSIS — M542 Cervicalgia: Secondary | ICD-10-CM | POA: Diagnosis not present

## 2022-02-16 DIAGNOSIS — G43719 Chronic migraine without aura, intractable, without status migrainosus: Secondary | ICD-10-CM | POA: Diagnosis not present

## 2022-02-22 DIAGNOSIS — J3081 Allergic rhinitis due to animal (cat) (dog) hair and dander: Secondary | ICD-10-CM | POA: Diagnosis not present

## 2022-02-22 DIAGNOSIS — J3089 Other allergic rhinitis: Secondary | ICD-10-CM | POA: Diagnosis not present

## 2022-02-22 DIAGNOSIS — J301 Allergic rhinitis due to pollen: Secondary | ICD-10-CM | POA: Diagnosis not present

## 2022-02-23 DIAGNOSIS — F331 Major depressive disorder, recurrent, moderate: Secondary | ICD-10-CM | POA: Diagnosis not present

## 2022-02-23 DIAGNOSIS — F411 Generalized anxiety disorder: Secondary | ICD-10-CM | POA: Diagnosis not present

## 2022-02-28 NOTE — Progress Notes (Signed)
Chief Complaint:   OBESITY [name] Park is here to discuss her progress with her obesity treatment plan along with follow-up of her obesity related diagnoses. [name] Park is on keeping a food journal and adhering to recommended goals of 1700-1800 calories and 100 protein and states she is following her eating plan approximately 75% of the time. [name] Park states she is exercising and walking 15-20 minutes 1-3 times per week.  Today's visit was #: 32 Starting weight: 177 LBS Starting date: 10/02/2019 Today's weight: 157 LBS Today's date: 02/14/2022  Total lbs lost to date: 20 LBS Total lbs lost since last in-office visit: 1 LB  Interim History: Patient on vacation over Thanksgiving day.  First office visit with me.  She usually sees [place].  Some days she goes over in calories, she is unsure of her protein intake.  Subjective:   1. Prediabetes Rapid insulin increased to 62.3 from 3 on 11/2021, very high.  A1c was 5.5.  Patient sees endocrinology doctor for metformin and Synthroid.  She is taking as instructed and tolerating well.  2. Vitamin B 12 deficiency Patient takes B12 daily 500 mcg.  She feels tired a lot but has been sick a lot this has several months and does not have her energy back.  3. Vitamin D deficiency Vitamin D level 54.3 on 11/2021.  Patient is taking 2000 IU daily OTC.  Assessment/Plan:  No orders of the defined types were placed in this encounter.   Medications Discontinued During This Encounter  Medication Reason   cyanocobalamin (VITAMIN B12) 500 MCG tablet Reorder     Meds ordered this encounter  Medications   cyanocobalamin (VITAMIN B12) 500 MCG tablet    Sig: Take 1 tablet (500 mcg total) by mouth daily.    Dispense:  90 tablet    Refill:  0     1. Prediabetes Continue PNP, weight loss.  Medication management per endocrinology Dr.  Jaquita Folds exercise as tolerated.  2. Vitamin B 12 deficiency B12 on 11/23/2021 was 644, at goal.   Refill- cyanocobalamin  (VITAMIN B12) 500 MCG tablet; Take 1 tablet (500 mcg total) by mouth daily.  Dispense: 90 tablet; Refill: 0  3. Vitamin D deficiency Low Vitamin D level contributes to fatigue and are associated with obesity, breast, and colon cancer. She agrees to continue to take prescription Vitamin D @50 ,000 IU every week and will follow-up for routine testing of Vitamin D, at least 2-3 times per year to avoid over-replacement. Continue OTC vitamin D, at goal, 3 to 4 months ago.  4. Obesity with current BMI 28.8 Patient REE in 06/2021 was 1814.  Her intake has been just over 1800 cal/day.  I educated patient on need to take in less in the right amount of protein in order to lose weight.  [name] Park is currently in the action stage of change. As such, her goal is to continue with weight loss efforts. She has agreed to keeping a food journal and adhering to recommended goals of 1500-1700 calories and 100+ protein daily.  Exercise goals:  As needed.  But increase activity as tolerated.  Behavioral modification strategies: increasing lean protein intake, decreasing simple carbohydrates, and planning for success.  [name] Park has agreed to follow-up with our clinic in 6 weeks. She was informed of the importance of frequent follow-up visits to maximize her success with intensive lifestyle modifications for her multiple health conditions.   Objective:   Blood pressure 125/76, pulse (!) 118, temperature 98.3 F (36.8 C), height 5'  2" (1.575 m), weight 157 lb (71.2 kg), SpO2 98 %. Body mass index is 28.72 kg/m.  General: Cooperative, alert, well developed, in no acute distress. HEENT: Conjunctivae and lids unremarkable. Cardiovascular: Regular rhythm.  Lungs: Normal work of breathing. Neurologic: No focal deficits.   Lab Results  Component Value Date   CREATININE 0.71 11/23/2021   BUN 13 11/23/2021   NA 141 11/23/2021   K 4.4 11/23/2021   CL 102 11/23/2021   CO2 22 11/23/2021   Lab Results  Component Value  Date   ALT 11 11/23/2021   AST 15 11/23/2021   ALKPHOS 64 11/23/2021   BILITOT 0.2 11/23/2021   Lab Results  Component Value Date   HGBA1C 5.5 11/23/2021   HGBA1C 5.5 07/28/2021   HGBA1C 5.4 03/28/2021   HGBA1C 5.7 (H) 11/10/2020   HGBA1C 5.5 07/22/2020   Lab Results  Component Value Date   INSULIN 62.3 (H) 11/23/2021   INSULIN 16.8 07/28/2021   INSULIN 21.6 03/28/2021   INSULIN 15.8 11/10/2020   INSULIN 16.3 07/22/2020   Lab Results  Component Value Date   TSH 1.850 06/03/2020   Lab Results  Component Value Date   CHOL 109 11/23/2021   HDL 48 11/23/2021   LDLCALC 35 11/23/2021   TRIG 153 (H) 11/23/2021   CHOLHDL 2.3 11/23/2021   Lab Results  Component Value Date   VD25OH 54.3 11/23/2021   VD25OH 62.5 07/28/2021   VD25OH 67.6 03/28/2021   Lab Results  Component Value Date   WBC 8.1 10/02/2019   HGB 13.3 10/02/2019   HCT 39.1 10/02/2019   MCV 89 10/02/2019   PLT 306 10/02/2019   No results found for: "IRON", "TIBC", "FERRITIN"  Attestation Statements:   Reviewed by clinician on day of visit: allergies, medications, problem list, medical history, surgical history, family history, social history, and previous encounter notes.  I, Malcolm Metro, RMA, am acting as Energy manager for [employer], DO.   I have reviewed the above documentation for accuracy and completeness, and I agree with the above. Carlye Grippe, D.O.  The 21st Century Cures Act was signed into law in 2016 which includes the topic of electronic health records.  This provides immediate access to information in MyChart.  This includes consultation notes, operative notes, office notes, lab results and pathology reports.  If you have any questions about what you read please let us know at your next visit so we can discuss your concerns and take corrective action if need be.  We are right here with you.

## 2022-03-01 DIAGNOSIS — J3089 Other allergic rhinitis: Secondary | ICD-10-CM | POA: Diagnosis not present

## 2022-03-01 DIAGNOSIS — J3081 Allergic rhinitis due to animal (cat) (dog) hair and dander: Secondary | ICD-10-CM | POA: Diagnosis not present

## 2022-03-01 DIAGNOSIS — J301 Allergic rhinitis due to pollen: Secondary | ICD-10-CM | POA: Diagnosis not present

## 2022-03-08 DIAGNOSIS — J3089 Other allergic rhinitis: Secondary | ICD-10-CM | POA: Diagnosis not present

## 2022-03-08 DIAGNOSIS — J3081 Allergic rhinitis due to animal (cat) (dog) hair and dander: Secondary | ICD-10-CM | POA: Diagnosis not present

## 2022-03-08 DIAGNOSIS — J301 Allergic rhinitis due to pollen: Secondary | ICD-10-CM | POA: Diagnosis not present

## 2022-03-09 DIAGNOSIS — F331 Major depressive disorder, recurrent, moderate: Secondary | ICD-10-CM | POA: Diagnosis not present

## 2022-03-09 DIAGNOSIS — F411 Generalized anxiety disorder: Secondary | ICD-10-CM | POA: Diagnosis not present

## 2022-03-23 DIAGNOSIS — F331 Major depressive disorder, recurrent, moderate: Secondary | ICD-10-CM | POA: Diagnosis not present

## 2022-03-23 DIAGNOSIS — F411 Generalized anxiety disorder: Secondary | ICD-10-CM | POA: Diagnosis not present

## 2022-03-28 ENCOUNTER — Encounter (INDEPENDENT_AMBULATORY_CARE_PROVIDER_SITE_OTHER): Payer: Self-pay | Admitting: Adult Health

## 2022-03-28 ENCOUNTER — Ambulatory Visit (INDEPENDENT_AMBULATORY_CARE_PROVIDER_SITE_OTHER): Payer: BC Managed Care – PPO | Admitting: Adult Health

## 2022-03-28 VITALS — BP 125/84 | HR 100 | Temp 97.6°F | Ht 62.0 in | Wt 156.0 lb

## 2022-03-28 DIAGNOSIS — E781 Pure hyperglyceridemia: Secondary | ICD-10-CM | POA: Diagnosis not present

## 2022-03-28 DIAGNOSIS — R7303 Prediabetes: Secondary | ICD-10-CM

## 2022-03-28 DIAGNOSIS — E669 Obesity, unspecified: Secondary | ICD-10-CM

## 2022-03-28 DIAGNOSIS — E559 Vitamin D deficiency, unspecified: Secondary | ICD-10-CM

## 2022-03-28 DIAGNOSIS — E538 Deficiency of other specified B group vitamins: Secondary | ICD-10-CM

## 2022-03-28 DIAGNOSIS — Z6828 Body mass index (BMI) 28.0-28.9, adult: Secondary | ICD-10-CM

## 2022-03-28 MED ORDER — VASCEPA 1 G PO CAPS: ORAL_CAPSULE | ORAL | 2 refills | Status: DC

## 2022-03-29 LAB — COMPREHENSIVE METABOLIC PANEL
ALT: 16 IU/L (ref 0–32)
AST: 20 IU/L (ref 0–40)
Albumin/Globulin Ratio: 1.7 (ref 1.2–2.2)
Albumin: 4.3 g/dL (ref 3.9–4.9)
Alkaline Phosphatase: 72 IU/L (ref 44–121)
BUN/Creatinine Ratio: 14 (ref 9–23)
BUN: 12 mg/dL (ref 6–24)
Bilirubin Total: 0.2 mg/dL (ref 0.0–1.2)
CO2: 21 mmol/L (ref 20–29)
Calcium: 9 mg/dL (ref 8.7–10.2)
Chloride: 101 mmol/L (ref 96–106)
Creatinine, Ser: 0.84 mg/dL (ref 0.57–1.00)
Globulin, Total: 2.6 g/dL (ref 1.5–4.5)
Glucose: 98 mg/dL (ref 70–99)
Potassium: 4.7 mmol/L (ref 3.5–5.2)
Sodium: 138 mmol/L (ref 134–144)
Total Protein: 6.9 g/dL (ref 6.0–8.5)
eGFR: 88 mL/min/{1.73_m2} (ref >59)

## 2022-03-29 LAB — LIPID PANEL
Chol/HDL Ratio: 2.4 ratio (ref 0.0–4.4)
Cholesterol, Total: 100 mg/dL (ref 100–199)
HDL: 41 mg/dL (ref >39)
LDL Chol Calc (NIH): 34 mg/dL (ref 0–99)
Triglycerides: 149 mg/dL (ref 0–149)
VLDL Cholesterol Cal: 25 mg/dL (ref 5–40)

## 2022-03-29 LAB — INSULIN, RANDOM: INSULIN: 30.4 u[IU]/mL — ABNORMAL HIGH (ref 2.6–24.9)

## 2022-03-29 LAB — VITAMIN B12: Vitamin B-12: 754 pg/mL (ref 232–1245)

## 2022-03-29 LAB — VITAMIN D 25 HYDROXY (VIT D DEFICIENCY, FRACTURES): Vit D, 25-Hydroxy: 77.8 ng/mL (ref 30.0–100.0)

## 2022-03-29 LAB — HEMOGLOBIN A1C
Est. average glucose Bld gHb Est-mCnc: 120 mg/dL
Hgb A1c MFr Bld: 5.8 % — ABNORMAL HIGH (ref 4.8–5.6)

## 2022-03-31 DIAGNOSIS — M542 Cervicalgia: Secondary | ICD-10-CM | POA: Diagnosis not present

## 2022-03-31 DIAGNOSIS — G43719 Chronic migraine without aura, intractable, without status migrainosus: Secondary | ICD-10-CM | POA: Diagnosis not present

## 2022-04-05 DIAGNOSIS — J3089 Other allergic rhinitis: Secondary | ICD-10-CM | POA: Diagnosis not present

## 2022-04-05 DIAGNOSIS — J301 Allergic rhinitis due to pollen: Secondary | ICD-10-CM | POA: Diagnosis not present

## 2022-04-05 DIAGNOSIS — J3081 Allergic rhinitis due to animal (cat) (dog) hair and dander: Secondary | ICD-10-CM | POA: Diagnosis not present

## 2022-04-06 DIAGNOSIS — F331 Major depressive disorder, recurrent, moderate: Secondary | ICD-10-CM | POA: Diagnosis not present

## 2022-04-06 DIAGNOSIS — F411 Generalized anxiety disorder: Secondary | ICD-10-CM | POA: Diagnosis not present

## 2022-04-11 NOTE — Progress Notes (Unsigned)
Chief Complaint:   OBESITY [name] Park is here to discuss her progress with her obesity treatment plan along with follow-up of her obesity related diagnoses. [name] Park is on keeping a food journal and adhering to recommended goals of 1500-1700 calories and 100 protein and states she is following her eating plan approximately 50-70% of the time. [name] Park states she is walking 15-20 minutes 2 times per week.  Today's visit was #: 20 Starting weight: 177 LBS Starting date: 10/02/2019 Today's weight: 156 LBS Today's date: 03/28/2022 Total lbs lost to date: 21 LBS Total lbs lost since last in-office visit: 1 LB  Interim History: ***  Subjective:   1. Vitamin B 12 deficiency Patient is currently taking daily B12 500 mcg.  She is still regaining energy after URI over Christmas holiday.  2. Hypertriglyceridemia *** Patient has occasional "fishy burps ".  3. Vitamin D deficiency Patient is on OTC vitamin D3 2000 IU daily.  She is still regaining energy after URI over Christmas holiday.  4. Prediabetes Dr. Chalmers Cater of endocrinology manages metformin 500 mg 2 tabs twice daily.  Patient denies GI upset.  Assessment/Plan:   1. Vitamin B 12 deficiency Slowly increase activity as tolerated.  Check labs today.  - Vitamin B12  2. Hypertriglyceridemia Check labs today.  Refill- VASCEPA 1 g capsule; TAKE TWO CAPSULES TWICE DAILY  Dispense: 120 capsule; Refill: 2  - Lipid panel  3. Vitamin D deficiency Slowly increase activity level as tolerated.  Check labs today.  - VITAMIN D 25 Hydroxy (Vit-D Deficiency, Fractures)  4. Prediabetes Check labs today.  - Comprehensive metabolic panel - Hemoglobin A1c - Insulin, random  5. Obesity with current BMI 28.6 [name] Park is currently in the action stage of change. As such, her goal is to continue with weight loss efforts. She has agreed to keeping a food journal and adhering to recommended goals of 1500-1700 calories and 100 protein  daily.  Exercise goals:  As is.  Behavioral modification strategies: increasing lean protein intake, decreasing simple carbohydrates, meal planning and cooking strategies, keeping healthy foods in the home, and planning for success.  [name] Park has agreed to follow-up with our clinic in {NUMBER 1-10:22536} weeks. She was informed of the importance of frequent follow-up visits to maximize her success with intensive lifestyle modifications for her multiple health conditions.   [name] Park was informed we would discuss her lab results at her next visit unless there is a critical issue that needs to be addressed sooner. [name] Park agreed to keep her next visit at the agreed upon time to discuss these results.  Objective:   Blood pressure 125/84, pulse 100, temperature 97.6 F (36.4 C), height 5\' 2"  (1.575 m), weight 156 lb (70.8 kg), SpO2 99 %. Body mass index is 28.53 kg/m.  General: Cooperative, alert, well developed, in no acute distress. HEENT: Conjunctivae and lids unremarkable. Cardiovascular: Regular rhythm.  Lungs: Normal work of breathing. Neurologic: No focal deficits.   Lab Results  Component Value Date   CREATININE 0.84 03/28/2022   BUN 12 03/28/2022   NA 138 03/28/2022   K 4.7 03/28/2022   CL 101 03/28/2022   CO2 21 03/28/2022   Lab Results  Component Value Date   ALT 16 03/28/2022   AST 20 03/28/2022   ALKPHOS 72 03/28/2022   BILITOT 0.2 03/28/2022   Lab Results  Component Value Date   HGBA1C 5.8 (H) 03/28/2022   HGBA1C 5.5 11/23/2021   HGBA1C 5.5 07/28/2021   HGBA1C 5.4 03/28/2021  HGBA1C 5.7 (H) 11/10/2020   Lab Results  Component Value Date   INSULIN 30.4 (H) 03/28/2022   INSULIN 62.3 (H) 11/23/2021   INSULIN 16.8 07/28/2021   INSULIN 21.6 03/28/2021   INSULIN 15.8 11/10/2020   Lab Results  Component Value Date   TSH 1.850 06/03/2020   Lab Results  Component Value Date   CHOL 100 03/28/2022   HDL 41 03/28/2022   LDLCALC 34 03/28/2022   TRIG 149  03/28/2022   CHOLHDL 2.4 03/28/2022   Lab Results  Component Value Date   VD25OH 77.8 03/28/2022   VD25OH 54.3 11/23/2021   VD25OH 62.5 07/28/2021   Lab Results  Component Value Date   WBC 8.1 10/02/2019   HGB 13.3 10/02/2019   HCT 39.1 10/02/2019   MCV 89 10/02/2019   PLT 306 10/02/2019   No results found for: "IRON", "TIBC", "FERRITIN"  Attestation Statements:   Reviewed by clinician on day of visit: allergies, medications, problem list, medical history, surgical history, family history, social history, and previous encounter notes.  I, Davy Pique, RMA, am acting as Location manager for Mina Marble, NP.  I have reviewed the above documentation for accuracy and completeness, and I agree with the above. -  ***

## 2022-04-13 ENCOUNTER — Encounter (INDEPENDENT_AMBULATORY_CARE_PROVIDER_SITE_OTHER): Payer: Self-pay | Admitting: Adult Health

## 2022-04-13 DIAGNOSIS — J301 Allergic rhinitis due to pollen: Secondary | ICD-10-CM | POA: Diagnosis not present

## 2022-04-13 DIAGNOSIS — J4520 Mild intermittent asthma, uncomplicated: Secondary | ICD-10-CM | POA: Diagnosis not present

## 2022-04-13 DIAGNOSIS — J3081 Allergic rhinitis due to animal (cat) (dog) hair and dander: Secondary | ICD-10-CM | POA: Diagnosis not present

## 2022-04-13 DIAGNOSIS — J3089 Other allergic rhinitis: Secondary | ICD-10-CM | POA: Diagnosis not present

## 2022-04-20 DIAGNOSIS — F331 Major depressive disorder, recurrent, moderate: Secondary | ICD-10-CM | POA: Diagnosis not present

## 2022-04-20 DIAGNOSIS — F411 Generalized anxiety disorder: Secondary | ICD-10-CM | POA: Diagnosis not present

## 2022-04-25 ENCOUNTER — Ambulatory Visit (INDEPENDENT_AMBULATORY_CARE_PROVIDER_SITE_OTHER): Payer: BC Managed Care – PPO | Admitting: Adult Health

## 2022-04-27 DIAGNOSIS — F411 Generalized anxiety disorder: Secondary | ICD-10-CM | POA: Diagnosis not present

## 2022-04-27 DIAGNOSIS — F331 Major depressive disorder, recurrent, moderate: Secondary | ICD-10-CM | POA: Diagnosis not present

## 2022-05-03 DIAGNOSIS — J301 Allergic rhinitis due to pollen: Secondary | ICD-10-CM | POA: Diagnosis not present

## 2022-05-03 DIAGNOSIS — J3089 Other allergic rhinitis: Secondary | ICD-10-CM | POA: Diagnosis not present

## 2022-05-03 DIAGNOSIS — J3081 Allergic rhinitis due to animal (cat) (dog) hair and dander: Secondary | ICD-10-CM | POA: Diagnosis not present

## 2022-05-09 ENCOUNTER — Encounter (INDEPENDENT_AMBULATORY_CARE_PROVIDER_SITE_OTHER): Payer: Self-pay | Admitting: Physician Assistant

## 2022-05-09 ENCOUNTER — Ambulatory Visit (INDEPENDENT_AMBULATORY_CARE_PROVIDER_SITE_OTHER): Payer: BC Managed Care – PPO | Admitting: Physician Assistant

## 2022-05-09 VITALS — BP 108/74 | HR 109 | Temp 97.9°F | Ht 62.0 in | Wt 152.0 lb

## 2022-05-09 DIAGNOSIS — E538 Deficiency of other specified B group vitamins: Secondary | ICD-10-CM | POA: Diagnosis not present

## 2022-05-09 DIAGNOSIS — E559 Vitamin D deficiency, unspecified: Secondary | ICD-10-CM | POA: Diagnosis not present

## 2022-05-09 DIAGNOSIS — R7303 Prediabetes: Secondary | ICD-10-CM | POA: Diagnosis not present

## 2022-05-09 DIAGNOSIS — E781 Pure hyperglyceridemia: Secondary | ICD-10-CM

## 2022-05-09 DIAGNOSIS — E669 Obesity, unspecified: Secondary | ICD-10-CM

## 2022-05-09 DIAGNOSIS — Z6827 Body mass index (BMI) 27.0-27.9, adult: Secondary | ICD-10-CM

## 2022-05-09 NOTE — Progress Notes (Signed)
WEIGHT SUMMARY AND BIOMETRICS  Vitals Temp: 97.9 F (36.6 C) BP: 108/74 Pulse Rate: (!) 109 SpO2: 98 %   Anthropometric Measurements Height: '5\' 2"'$  (1.575 m) Weight: 152 lb (68.9 kg) BMI (Calculated): 27.79 Weight at Last Visit: 156 lb Weight Lost Since Last Visit: 4 lb Starting Weight: 177 lb   Body Composition  Body Fat %: 34.4 % Fat Mass (lbs): 52.4 lbs Muscle Mass (lbs): 95 lbs Total Body Water (lbs): 63.2 lbs Visceral Fat Rating : 6   Other Clinical Data Fasting: no Labs: no Today's Visit #: 34 Starting Date: 10/02/19    Chief Complaint:   OBESITY [name] is here to discuss her progress with her obesity treatment plan. She is on the keeping a food journal and adhering to recommended goals of 1600-1700 calories and 100 grams of protein and states she is following her eating plan approximately 70 % of the time. She states she is exercising walking, 15-20 minutes 1-3 times per week.   Interim History: [name] Park has done well with weight loss.  She has lost 4 lbs since last visit and 25 lbs overall. She has been focusing on calorie and protein goals. Exercising more, trying to walk at least 15-20 minutes 1-3 times weekly.  Works part time as Consulting civil engineer at [employer].  Has trip with family to [place].  Plans to visit aunt in [place] over the summer.   Subjective:   1. Prediabetes Follows regularly with endocrinology- Dr. Chalmers Cater.  Prediabetes Last A1c was 5.8/ Insulin 30.4 but improved from previous. A1c was slightly increased and we discussed how holiday eating/indulging likely contributed to increase.   She is working on Camera operator to decrease simple carbohydrates, increase lean proteins and exercise to promote weight loss, improve glycemic control and prevent progression to Type 2 diabetes.   Medication(s): Metformin 500 mg 2 tabs twice daily. No side effects with metformin.   Lab Results  Component Value Date   HGBA1C 5.8 (H)  03/28/2022   HGBA1C 5.5 11/23/2021   HGBA1C 5.5 07/28/2021   HGBA1C 5.4 03/28/2021   HGBA1C 5.7 (H) 11/10/2020   Lab Results  Component Value Date   INSULIN 30.4 (H) 03/28/2022   INSULIN 62.3 (H) 11/23/2021   INSULIN 16.8 07/28/2021   INSULIN 21.6 03/28/2021   INSULIN 15.8 11/10/2020     2. Vitamin D deficiency Vitamin D Deficiency Vitamin D is at goal of 50.  Most recent vitamin D level was 77. She is on OTC vitamin D3 2000 IU daily. Lab Results  Component Value Date   VD25OH 77.8 03/28/2022   VD25OH 54.3 11/23/2021   VD25OH 62.5 07/28/2021     3. Hypertriglyceridemia Hyperlipidemia LDL is at goal. Medication(s): Crestor 5 mg daily per Dr. Chalmers Cater Cardiovascular risk factors: dyslipidemia  Lab Results  Component Value Date   CHOL 100 03/28/2022   HDL 41 03/28/2022   LDLCALC 34 03/28/2022   TRIG 149 03/28/2022   CHOLHDL 2.4 03/28/2022   CHOLHDL 2.3 11/23/2021   CHOLHDL 3.3 07/28/2021   Lab Results  Component Value Date   ALT 16 03/28/2022   AST 20 03/28/2022   ALKPHOS 72 03/28/2022   BILITOT 0.2 03/28/2022   The ASCVD Risk score (Arnett DK, et al., 2019) failed to calculate for the following reasons:   The valid total cholesterol range is 130 to 320 mg/dL  Plan: Continue statin. Continue to follow nutrition plan to decrease simple carbohydrates, decrease saturated fats and cholesterol and increase lean  protein and exercise to promote weight loss and improve lipids/decrease CV risk .   4. Vitamin B 12 deficiency She is reporting fatigue. B12 level on supplement is much improved.  5. Generalized obesity- Start BMI 32.37 6. Current BMI 27.9  She has done well with weight loss.  Assessment/Plan:   1. Prediabetes Continue metformin.  Continue working on nutrition plan to decrease simple carbohydrates, increase lean proteins and exercise to promote weight loss, improve glycemic control and prevent progression to Type 2 diabetes.  Continue to follow  regularly with Dr. Chalmers Cater.  Will follow with Dr. Chalmers Cater.   2. Vitamin D deficiency Decrease OTC vitamin D to 2000 IU every other day until all taken, then start vitamin D 1000 units daily.  Will plan to recheck vitamin D level at least 2-3 times yearly to avoid over supplementation.   3. Hypertriglyceridemia Plan: Continue statin. Continue to follow nutrition plan to decrease simple carbohydrates, decrease saturated fats and cholesterol and increase lean protein and exercise to promote weight loss and improve lipids/decrease CV risk .   4. Vitamin B 12 deficiency Continue B 12 500 mcg daily. Continue nutrition plan which is a B12 rich diet.  Follow up level in 3-4 months.   5. Generalized obesity- Start BMI 32.37 6. Current BMI 27.9  [name] Park is currently in the action stage of change. As such, her goal is to continue with weight loss efforts. She has agreed to keeping a food journal and adhering to recommended goals of 1500-1700 calories and 100 grams of protein.   Exercise goals:  as is  Behavioral modification strategies: increasing lean protein intake, decreasing simple carbohydrates, increasing water intake, planning for success, and keeping a strict food journal.  [name] Park has agreed to follow-up with our clinic in 6 weeks. She was informed of the importance of frequent follow-up visits to maximize her success with intensive lifestyle modifications for her multiple health conditions.    Objective:   Blood pressure 108/74, pulse (!) 109, temperature 97.9 F (36.6 C), height '5\' 2"'$  (1.575 m), weight 152 lb (68.9 kg), SpO2 98 %. Body mass index is 27.8 kg/m.  General: Cooperative, alert, well developed, in no acute distress. HEENT: Conjunctivae and lids unremarkable. Cardiovascular: Regular rhythm.  Lungs: Normal work of breathing. Neurologic: No focal deficits.   Lab Results  Component Value Date   CREATININE 0.84 03/28/2022   BUN 12 03/28/2022   NA 138 03/28/2022   K 4.7  03/28/2022   CL 101 03/28/2022   CO2 21 03/28/2022   Lab Results  Component Value Date   ALT 16 03/28/2022   AST 20 03/28/2022   ALKPHOS 72 03/28/2022   BILITOT 0.2 03/28/2022   Lab Results  Component Value Date   HGBA1C 5.8 (H) 03/28/2022   HGBA1C 5.5 11/23/2021   HGBA1C 5.5 07/28/2021   HGBA1C 5.4 03/28/2021   HGBA1C 5.7 (H) 11/10/2020   Lab Results  Component Value Date   INSULIN 30.4 (H) 03/28/2022   INSULIN 62.3 (H) 11/23/2021   INSULIN 16.8 07/28/2021   INSULIN 21.6 03/28/2021   INSULIN 15.8 11/10/2020   Lab Results  Component Value Date   TSH 1.850 06/03/2020   Lab Results  Component Value Date   CHOL 100 03/28/2022   HDL 41 03/28/2022   LDLCALC 34 03/28/2022   TRIG 149 03/28/2022   CHOLHDL 2.4 03/28/2022   Lab Results  Component Value Date   VD25OH 77.8 03/28/2022   VD25OH 54.3 11/23/2021   VD25OH 62.5 07/28/2021  Lab Results  Component Value Date   WBC 8.1 10/02/2019   HGB 13.3 10/02/2019   HCT 39.1 10/02/2019   MCV 89 10/02/2019   PLT 306 10/02/2019   No results found for: "IRON", "TIBC", "FERRITIN"  Obesity Behavioral Intervention:   Approximately 15 minutes were spent on the discussion below.  ASK: We discussed the diagnosis of obesity with [name] Park today and [name] Park agreed to give [place] permission to discuss obesity behavioral modification therapy today.  ASSESS: [name] Park has the diagnosis of obesity and her BMI today is 27.9. [name] Park is in the action stage of change.   ADVISE: [name] Park was educated on the multiple health risks of obesity as well as the benefit of weight loss to improve her health. She was advised of the need for long term treatment and the importance of lifestyle modifications to improve her current health and to decrease her risk of future health problems.  AGREE: Multiple dietary modification options and treatment options were discussed and [name] Park agreed to follow the recommendations documented in the above  note.  ARRANGE: [name] Park was educated on the importance of frequent visits to treat obesity as outlined per CMS and USPSTF guidelines and agreed to schedule her next follow up appointment today.  Attestation Statements:   Reviewed by clinician on day of visit: allergies, medications, problem list, medical history, surgical history, family history, social history, and previous encounter notes.  ime spent on visit including pre-visit chart review and post-visit care and charting was 38 minutes.   Kashden Deboy,PA-C

## 2022-05-11 DIAGNOSIS — S93411A Sprain of calcaneofibular ligament of right ankle, initial encounter: Secondary | ICD-10-CM | POA: Diagnosis not present

## 2022-05-11 DIAGNOSIS — S83421A Sprain of lateral collateral ligament of right knee, initial encounter: Secondary | ICD-10-CM | POA: Diagnosis not present

## 2022-05-11 DIAGNOSIS — X501XXA Overexertion from prolonged static or awkward postures, initial encounter: Secondary | ICD-10-CM | POA: Diagnosis not present

## 2022-05-11 DIAGNOSIS — F411 Generalized anxiety disorder: Secondary | ICD-10-CM | POA: Diagnosis not present

## 2022-05-11 DIAGNOSIS — S93491A Sprain of other ligament of right ankle, initial encounter: Secondary | ICD-10-CM | POA: Diagnosis not present

## 2022-05-12 DIAGNOSIS — M542 Cervicalgia: Secondary | ICD-10-CM | POA: Diagnosis not present

## 2022-05-12 DIAGNOSIS — G43719 Chronic migraine without aura, intractable, without status migrainosus: Secondary | ICD-10-CM | POA: Diagnosis not present

## 2022-05-25 DIAGNOSIS — F3189 Other bipolar disorder: Secondary | ICD-10-CM | POA: Diagnosis not present

## 2022-05-25 DIAGNOSIS — F1020 Alcohol dependence, uncomplicated: Secondary | ICD-10-CM | POA: Diagnosis not present

## 2022-05-25 DIAGNOSIS — F411 Generalized anxiety disorder: Secondary | ICD-10-CM | POA: Diagnosis not present

## 2022-05-31 DIAGNOSIS — J301 Allergic rhinitis due to pollen: Secondary | ICD-10-CM | POA: Diagnosis not present

## 2022-05-31 DIAGNOSIS — J3089 Other allergic rhinitis: Secondary | ICD-10-CM | POA: Diagnosis not present

## 2022-05-31 DIAGNOSIS — J3081 Allergic rhinitis due to animal (cat) (dog) hair and dander: Secondary | ICD-10-CM | POA: Diagnosis not present

## 2022-06-08 DIAGNOSIS — F411 Generalized anxiety disorder: Secondary | ICD-10-CM | POA: Diagnosis not present

## 2022-06-20 ENCOUNTER — Encounter (INDEPENDENT_AMBULATORY_CARE_PROVIDER_SITE_OTHER): Payer: Self-pay | Admitting: Adult Health

## 2022-06-20 ENCOUNTER — Ambulatory Visit (INDEPENDENT_AMBULATORY_CARE_PROVIDER_SITE_OTHER): Payer: BC Managed Care – PPO | Admitting: Adult Health

## 2022-06-20 VITALS — BP 123/79 | HR 103 | Temp 98.2°F | Ht 62.0 in | Wt 151.0 lb

## 2022-06-20 DIAGNOSIS — Z6827 Body mass index (BMI) 27.0-27.9, adult: Secondary | ICD-10-CM

## 2022-06-20 DIAGNOSIS — R7303 Prediabetes: Secondary | ICD-10-CM

## 2022-06-20 DIAGNOSIS — E559 Vitamin D deficiency, unspecified: Secondary | ICD-10-CM | POA: Diagnosis not present

## 2022-06-20 DIAGNOSIS — M25571 Pain in right ankle and joints of right foot: Secondary | ICD-10-CM | POA: Diagnosis not present

## 2022-06-20 DIAGNOSIS — E669 Obesity, unspecified: Secondary | ICD-10-CM | POA: Diagnosis not present

## 2022-06-20 NOTE — Progress Notes (Signed)
WEIGHT SUMMARY AND BIOMETRICS  Vitals Temp: 98.2 F (36.8 C) BP: 123/79 Pulse Rate: (!) 103 SpO2: 98 %   Anthropometric Measurements Height:  (1.575 m) Weight: 151 lb (68.5 kg) BMI (Calculated): 27.61 Weight at Last Visit: 148lb Weight Lost Since Last Visit: 0 Weight Gained Since Last Visit: 3lb Starting Weight: 177lb Total Weight Loss (lbs): 26 lb (11.8 kg)   Body Composition  Body Fat %: 35.9 % Fat Mass (lbs): 54.4 lbs Muscle Mass (lbs): 92.4 lbs Total Body Water (lbs): 63.4 lbs Visceral Fat Rating : 7   Other Clinical Data Fasting: No Labs: No Today's Visit #: 35 Starting Date: 10/02/19    Chief Complaint:   OBESITY [name] Park is here to discuss her progress with her obesity treatment plan. She is on the keeping a food journal and adhering to recommended goals of 1500-1700 calories and 85 protein and states she is following her eating plan approximately 70-80 % of the time. She states she is not currently exercising due to R ankle pain r/t to recent fall at work.   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  Her 47th birthday is this Thursday. 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  Of Note- She needs recent labs faxed to her Psychiatrist/Dr. Mitzi Davenport  Subjective:   1. Acute right ankle pain 05/11/2022 UC OV Notes: Patient is a pleasant 47 year old female presenting today for right ankle and right knee pain as well as mild right lumbar pain that began 2 days ago at work. She reports that there was a tarp secured by a cord that she accidentally tripped over onto concrete sustaining some abrasions to her palms/wrists however she is not currently complaining of  any complaints there and is predominately complaining of localized lateral right ankle tenderness and lateral more mild right knee tenderness with even more mild right lumbar tenderness all of which were not present at the time of the injury but have been progressively more pronounced as time has gone on. She has tried Aleve without much improvement. Denies any head injury or loss of consciousness. Further pertinent negatives per ROS.  Ankle Injury  The incident occurred 3 to 5 days ago. The incident occurred at work. The injury mechanism was a fall. Pertinent negatives include no inability to bear weight, loss of motion, loss of sensation, numbness or tingling. The symptoms are aggravated by weight bearing, movement and palpation. She has tried immobilization and NSAIDs for the symptoms.  INDICATION: fell at work from standing height, right lateral knee/ankle pain. Ruling out fracture, Sprain of lateral collateral ligament of right knee, initial encounter \ S83.421A Sprain of lateral collateral ligament of right knee, initial encounter fell at work from standing height, right lateral knee/ankle pain. Ruling out fracture  152 COMPARISON: None.   1. No acute fracture. 2. No joint malalignment. 3. No joint effusion. 4. Mild medial tibiofemoral and patellofemoral degenerative changes with mild joint space narrowing and small marginal spurring and subcortical sclerosis. 5. Small superior patellar pole enthesophyte.   2. Prediabetes Lab Results  Component Value Date   HGBA1C 5.8 (H) 03/28/2022   HGBA1C 5.5 11/23/2021   HGBA1C 5.5 07/28/2021   Dr. Talmage Nap manages  metFORMIN (GLUCOPHAGE) 500  MG tablet Take 500 mg by mouth 2 (two) times daily. 2 TABLETS WITH BREAKFAST AND 2 TABLETS WITH DINNER   3. Vitamin D deficiency 3/50/2024- decreased OTC vitamin D to 2000 IU to 1000 IU  Assessment/Plan:   1. Acute right ankle pain Continue to increase activity as tolerated. Continue OTC Vit De  Supplementation.  2. Prediabetes Continue Biguanide therapy per Endocrinology  3. Vitamin D deficiency Will plan to recheck vitamin D level at least 2-3 times yearly to avoid over supplementation.   4. BMI 27.0-27.9,adult Current BMI 27.61  [name] Park is currently in the action stage of change. As such, her goal is to continue with weight loss efforts. She has agreed to keeping a food journal and adhering to recommended goals of 1500-1700 calories and 85 protein.   Exercise goals: For substantial health benefits, adults should do at least 150 minutes (2 hours and 30 minutes) a week of moderate-intensity, or 75 minutes (1 hour and 15 minutes) a week of vigorous-intensity aerobic physical activity, or an equivalent combination of moderate- and vigorous-intensity aerobic activity. Aerobic activity should be performed in episodes of at least 10 minutes, and preferably, it should be spread throughout the week.  Behavioral modification strategies: increasing lean protein intake, decreasing simple carbohydrates, increasing vegetables, increasing water intake, meal planning and cooking strategies, keeping healthy foods in the home, better snacking choices, planning for success, and keeping a strict food journal.  [name] Park has agreed to follow-up with our clinic in 4 weeks. She was informed of the importance of frequent follow-up visits to maximize her success with intensive lifestyle modifications for her multiple health conditions.   Objective:   Blood pressure 123/79, pulse (!) 103, temperature 98.2 F (36.8 C), height 5\' 2"  (1.575 m), weight 151 lb (68.5 kg), SpO2 98 %. Body mass index is 27.62 kg/m.  General: Cooperative, alert, well developed, in no acute distress. HEENT: Conjunctivae and lids unremarkable. Cardiovascular: Regular rhythm.  Lungs: Normal work of breathing. Neurologic: No focal deficits.   Lab Results  Component Value Date   CREATININE 0.84 03/28/2022   BUN 12 03/28/2022   NA  138 03/28/2022   K 4.7 03/28/2022   CL 101 03/28/2022   CO2 21 03/28/2022   Lab Results  Component Value Date   ALT 16 03/28/2022   AST 20 03/28/2022   ALKPHOS 72 03/28/2022   BILITOT 0.2 03/28/2022   Lab Results  Component Value Date   HGBA1C 5.8 (H) 03/28/2022   HGBA1C 5.5 11/23/2021   HGBA1C 5.5 07/28/2021   HGBA1C 5.4 03/28/2021   HGBA1C 5.7 (H) 11/10/2020   Lab Results  Component Value Date   INSULIN 30.4 (H) 03/28/2022   INSULIN 62.3 (H) 11/23/2021   INSULIN 16.8 07/28/2021   INSULIN 21.6 03/28/2021   INSULIN 15.8 11/10/2020   Lab Results  Component Value Date   TSH 1.850 06/03/2020   Lab Results  Component Value Date   CHOL 100 03/28/2022   HDL 41 03/28/2022   LDLCALC 34 03/28/2022   TRIG 149 03/28/2022   CHOLHDL 2.4 03/28/2022   Lab Results  Component Value Date   VD25OH 77.8 03/28/2022   VD25OH 54.3 11/23/2021   VD25OH 62.5 07/28/2021   Lab Results  Component Value Date   WBC 8.1 10/02/2019   HGB 13.3 10/02/2019   HCT 39.1 10/02/2019   MCV 89 10/02/2019   PLT 306 10/02/2019   No results found for: "IRON", "TIBC", "FERRITIN"  Attestation Statements:   Reviewed by clinician on day  of visit: allergies, medications, problem list, medical history, surgical history, family history, social history, and previous encounter notes.  Time spent on visit including pre-visit chart review and post-visit care and charting was 28 minutes.   I have reviewed the above documentation for accuracy and completeness, and I agree with the above. -  Annalina Needles d. Erienne Spelman, NP-C

## 2022-06-21 DIAGNOSIS — J301 Allergic rhinitis due to pollen: Secondary | ICD-10-CM | POA: Diagnosis not present

## 2022-06-21 DIAGNOSIS — J3089 Other allergic rhinitis: Secondary | ICD-10-CM | POA: Diagnosis not present

## 2022-06-21 DIAGNOSIS — J3081 Allergic rhinitis due to animal (cat) (dog) hair and dander: Secondary | ICD-10-CM | POA: Diagnosis not present

## 2022-06-27 DIAGNOSIS — M542 Cervicalgia: Secondary | ICD-10-CM | POA: Diagnosis not present

## 2022-06-27 DIAGNOSIS — G43719 Chronic migraine without aura, intractable, without status migrainosus: Secondary | ICD-10-CM | POA: Diagnosis not present

## 2022-06-30 DIAGNOSIS — F411 Generalized anxiety disorder: Secondary | ICD-10-CM | POA: Diagnosis not present

## 2022-07-05 DIAGNOSIS — M25571 Pain in right ankle and joints of right foot: Secondary | ICD-10-CM | POA: Diagnosis not present

## 2022-07-05 DIAGNOSIS — J0140 Acute pansinusitis, unspecified: Secondary | ICD-10-CM | POA: Diagnosis not present

## 2022-07-18 ENCOUNTER — Ambulatory Visit (INDEPENDENT_AMBULATORY_CARE_PROVIDER_SITE_OTHER): Payer: BC Managed Care – PPO | Admitting: Adult Health

## 2022-07-18 ENCOUNTER — Encounter (INDEPENDENT_AMBULATORY_CARE_PROVIDER_SITE_OTHER): Payer: Self-pay | Admitting: Adult Health

## 2022-07-18 VITALS — BP 115/79 | HR 104 | Temp 98.1°F | Ht 62.0 in | Wt 152.0 lb

## 2022-07-18 DIAGNOSIS — L218 Other seborrheic dermatitis: Secondary | ICD-10-CM | POA: Diagnosis not present

## 2022-07-18 DIAGNOSIS — R7303 Prediabetes: Secondary | ICD-10-CM

## 2022-07-18 DIAGNOSIS — L718 Other rosacea: Secondary | ICD-10-CM | POA: Diagnosis not present

## 2022-07-18 DIAGNOSIS — E781 Pure hyperglyceridemia: Secondary | ICD-10-CM

## 2022-07-18 DIAGNOSIS — J3089 Other allergic rhinitis: Secondary | ICD-10-CM | POA: Diagnosis not present

## 2022-07-18 DIAGNOSIS — Z6827 Body mass index (BMI) 27.0-27.9, adult: Secondary | ICD-10-CM | POA: Diagnosis not present

## 2022-07-18 DIAGNOSIS — L2089 Other atopic dermatitis: Secondary | ICD-10-CM | POA: Diagnosis not present

## 2022-07-18 DIAGNOSIS — E669 Obesity, unspecified: Secondary | ICD-10-CM

## 2022-07-18 DIAGNOSIS — L700 Acne vulgaris: Secondary | ICD-10-CM | POA: Diagnosis not present

## 2022-07-18 DIAGNOSIS — J3081 Allergic rhinitis due to animal (cat) (dog) hair and dander: Secondary | ICD-10-CM | POA: Diagnosis not present

## 2022-07-18 DIAGNOSIS — J301 Allergic rhinitis due to pollen: Secondary | ICD-10-CM | POA: Diagnosis not present

## 2022-07-18 MED ORDER — VASCEPA 1 G PO CAPS: ORAL_CAPSULE | ORAL | 2 refills | Status: DC

## 2022-07-18 NOTE — Progress Notes (Addendum)
WEIGHT SUMMARY AND BIOMETRICS  Vitals Temp: 98.1 F (36.7 C) BP: 115/79 Pulse Rate: (!) 104 SpO2: 97 %   Anthropometric Measurements Height: 5\' 2"  (1.575 m) Weight: 152 lb (68.9 kg) BMI (Calculated): 27.79 Weight at Last Visit: 151lb Weight Lost Since Last Visit: 0 Weight Gained Since Last Visit: 1lb Starting Weight: 177lb Total Weight Loss (lbs): 25 lb (11.3 kg)   Body Composition  Body Fat %: 35.9 % Fat Mass (lbs): 54.8 lbs Muscle Mass (lbs): 93 lbs Total Body Water (lbs): 66.6 lbs Visceral Fat Rating : 7   Other Clinical Data Fasting: no Labs: no Today's Visit #: 36 Starting Date: 10/02/19    Chief Complaint:   OBESITY [name] Park is here to discuss her progress with her obesity treatment plan. She is on the keeping a food journal and adhering to recommended goals of 1500-1700 calories and 85 protein and states she is following her eating plan approximately 80 % of the time. She states she is not currently exercising due to recently URI and R ankle injury.   Interim History:  07/05/2022- UC visit for Pansinusitis and persistent R ankle pain Xray negative for fx Acute non-recurrent pansinusitis - amoxicillin (AMOXIL) 500 mg capsule; Take 1 capsule (500 mg total) by mouth 3 (three) times a day for 10 days.  She completed ABX course-07/17/2022  She reports R ankle stiffness, pain, and edema. She has not been wearing an ankle support, or elevating RLE when able.  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  Stress- denies recent stress or anxiety  Exercise-Unable to exercise due to R ankle pain and recent URI  Reviewed Bioempedence results with pt: Muscle Mass:+ 0.7 lbs Adipose Mass:+ 0. 4 lbs  Subjective:   1. Hypertriglyceridemia Lipid Panel   Latest Reference Range & Units 01/14/20 08:22  Total CHOL/HDL Ratio 0.0 - 4.4  ratio 4.6 (H)  Cholesterol, Total 100 - 199 mg/dL 865  HDL Cholesterol >78 mg/dL 40  Triglycerides 0 - 469 mg/dL 629 (H)  VLDL Cholesterol Cal 5 - 40 mg/dL 53 (H)  LDL Chol Calc (NIH) 0 - 99 mg/dL 92  (H): Data is abnormally high  02/03/20 OTC Krill Oil was replaced with Vascepa 2 mg BID with meals     Component Value Date/Time   CHOL 100 03/28/2022 0911   TRIG 149 03/28/2022 0911   HDL 41 03/28/2022 0911   CHOLHDL 2.4 03/28/2022 0911   LDLCALC 34 03/28/2022 0911   LABVLDL 25 03/28/2022 0911   08/24/21-Dr. Balan/Endo started her on Crestor 5 mg daily.  She will have labs and f/u OV with Dr. Talmage Nap end of June 2024  2. Prediabetes Lab Results  Component Value Date   HGBA1C 5.8 (H) 03/28/2022   HGBA1C 5.5 11/23/2021   HGBA1C 5.5 07/28/2021   Dr. Cristopher Estimable manages Metformin 500mg - 2 tabs BID with meals She denies GI upset. She reports stable appetite. She will have labs and f/u OV with Dr. Talmage Nap end of June 2024   Assessment/Plan:   1. Hypertriglyceridemia Refill - VASCEPA 1 g capsule; TAKE TWO CAPSULES TWICE DAILY  Dispense: 120 capsule; Refill: 2 Endo will check lipid June 2024  2. Prediabetes Increase protein and regular exercise Check Labs at next OV  3. BMI 27.0-27.9,adult Current BMI 27.79  [name] Park is currently in the action stage of change. As such, her goal is to continue with  weight loss efforts. She has agreed to keeping a food journal and adhering to recommended goals of 1500-1700 calories and 85 protein.   Exercise goals: Rest until R ankle pain resolves  Behavioral modification strategies: increasing lean protein intake, decreasing simple carbohydrates, increasing vegetables, increasing water intake, decreasing liquid calories, meal planning and cooking strategies, keeping healthy foods in the home, and planning for success.  Welcome has agreed to follow-up with our clinic in 4 weeks. She was informed of the importance of frequent follow-up visits to  maximize her success with intensive lifestyle modifications for her multiple health conditions.   Check Fasting Labs at next OV  Objective:   Blood pressure 115/79, pulse (!) 104, temperature 98.1 F (36.7 C), height 5\' 2"  (1.575 m), weight 152 lb (68.9 kg), SpO2 97 %. Body mass index is 27.8 kg/m.  General: Cooperative, alert, well developed, in no acute distress. HEENT: Conjunctivae and lids unremarkable. Cardiovascular: Regular rhythm.  Lungs: Normal work of breathing. Neurologic: No focal deficits.   Lab Results  Component Value Date   CREATININE 0.84 03/28/2022   BUN 12 03/28/2022   NA 138 03/28/2022   K 4.7 03/28/2022   CL 101 03/28/2022   CO2 21 03/28/2022   Lab Results  Component Value Date   ALT 16 03/28/2022   AST 20 03/28/2022   ALKPHOS 72 03/28/2022   BILITOT 0.2 03/28/2022   Lab Results  Component Value Date   HGBA1C 5.8 (H) 03/28/2022   HGBA1C 5.5 11/23/2021   HGBA1C 5.5 07/28/2021   HGBA1C 5.4 03/28/2021   HGBA1C 5.7 (H) 11/10/2020   Lab Results  Component Value Date   INSULIN 30.4 (H) 03/28/2022   INSULIN 62.3 (H) 11/23/2021   INSULIN 16.8 07/28/2021   INSULIN 21.6 03/28/2021   INSULIN 15.8 11/10/2020   Lab Results  Component Value Date   TSH 1.850 06/03/2020   Lab Results  Component Value Date   CHOL 100 03/28/2022   HDL 41 03/28/2022   LDLCALC 34 03/28/2022   TRIG 149 03/28/2022   CHOLHDL 2.4 03/28/2022   Lab Results  Component Value Date   VD25OH 77.8 03/28/2022   VD25OH 54.3 11/23/2021   VD25OH 62.5 07/28/2021   Lab Results  Component Value Date   WBC 8.1 10/02/2019   HGB 13.3 10/02/2019   HCT 39.1 10/02/2019   MCV 89 10/02/2019   PLT 306 10/02/2019   No results found for: "IRON", "TIBC", "FERRITIN"  Attestation Statements:   Reviewed by clinician on day of visit: allergies, medications, problem list, medical history, surgical history, family history, social history, and previous encounter notes.  I have reviewed the  above documentation for accuracy and completeness, and I agree with the above. -  [name] Park d. [name] Leveque, NP-C

## 2022-07-20 DIAGNOSIS — F411 Generalized anxiety disorder: Secondary | ICD-10-CM | POA: Diagnosis not present

## 2022-08-03 DIAGNOSIS — F411 Generalized anxiety disorder: Secondary | ICD-10-CM | POA: Diagnosis not present

## 2022-08-05 DIAGNOSIS — L03116 Cellulitis of left lower limb: Secondary | ICD-10-CM | POA: Diagnosis not present

## 2022-08-08 DIAGNOSIS — M542 Cervicalgia: Secondary | ICD-10-CM | POA: Diagnosis not present

## 2022-08-08 DIAGNOSIS — G43719 Chronic migraine without aura, intractable, without status migrainosus: Secondary | ICD-10-CM | POA: Diagnosis not present

## 2022-08-15 DIAGNOSIS — J3081 Allergic rhinitis due to animal (cat) (dog) hair and dander: Secondary | ICD-10-CM | POA: Diagnosis not present

## 2022-08-15 DIAGNOSIS — J3089 Other allergic rhinitis: Secondary | ICD-10-CM | POA: Diagnosis not present

## 2022-08-15 DIAGNOSIS — J301 Allergic rhinitis due to pollen: Secondary | ICD-10-CM | POA: Diagnosis not present

## 2022-08-17 DIAGNOSIS — F411 Generalized anxiety disorder: Secondary | ICD-10-CM | POA: Diagnosis not present

## 2022-08-25 DIAGNOSIS — E039 Hypothyroidism, unspecified: Secondary | ICD-10-CM | POA: Diagnosis not present

## 2022-08-28 DIAGNOSIS — F411 Generalized anxiety disorder: Secondary | ICD-10-CM | POA: Diagnosis not present

## 2022-09-01 DIAGNOSIS — E669 Obesity, unspecified: Secondary | ICD-10-CM | POA: Diagnosis not present

## 2022-09-01 DIAGNOSIS — E039 Hypothyroidism, unspecified: Secondary | ICD-10-CM | POA: Diagnosis not present

## 2022-09-01 DIAGNOSIS — R7301 Impaired fasting glucose: Secondary | ICD-10-CM | POA: Diagnosis not present

## 2022-09-01 DIAGNOSIS — E282 Polycystic ovarian syndrome: Secondary | ICD-10-CM | POA: Diagnosis not present

## 2022-09-13 ENCOUNTER — Encounter (INDEPENDENT_AMBULATORY_CARE_PROVIDER_SITE_OTHER): Payer: Self-pay | Admitting: Adult Health

## 2022-09-13 ENCOUNTER — Telehealth (INDEPENDENT_AMBULATORY_CARE_PROVIDER_SITE_OTHER): Payer: Self-pay

## 2022-09-13 ENCOUNTER — Ambulatory Visit (INDEPENDENT_AMBULATORY_CARE_PROVIDER_SITE_OTHER): Payer: BC Managed Care – PPO | Admitting: Adult Health

## 2022-09-13 VITALS — BP 124/85 | HR 104 | Temp 97.4°F | Ht 62.0 in | Wt 152.0 lb

## 2022-09-13 DIAGNOSIS — Z6827 Body mass index (BMI) 27.0-27.9, adult: Secondary | ICD-10-CM

## 2022-09-13 DIAGNOSIS — E559 Vitamin D deficiency, unspecified: Secondary | ICD-10-CM | POA: Diagnosis not present

## 2022-09-13 DIAGNOSIS — E538 Deficiency of other specified B group vitamins: Secondary | ICD-10-CM

## 2022-09-13 DIAGNOSIS — R7303 Prediabetes: Secondary | ICD-10-CM

## 2022-09-13 DIAGNOSIS — E781 Pure hyperglyceridemia: Secondary | ICD-10-CM | POA: Diagnosis not present

## 2022-09-13 DIAGNOSIS — J301 Allergic rhinitis due to pollen: Secondary | ICD-10-CM | POA: Diagnosis not present

## 2022-09-13 DIAGNOSIS — L03116 Cellulitis of left lower limb: Secondary | ICD-10-CM

## 2022-09-13 DIAGNOSIS — J3089 Other allergic rhinitis: Secondary | ICD-10-CM | POA: Diagnosis not present

## 2022-09-13 DIAGNOSIS — E669 Obesity, unspecified: Secondary | ICD-10-CM

## 2022-09-13 DIAGNOSIS — J3081 Allergic rhinitis due to animal (cat) (dog) hair and dander: Secondary | ICD-10-CM | POA: Diagnosis not present

## 2022-09-13 MED ORDER — VASCEPA 1 G PO CAPS: ORAL_CAPSULE | ORAL | 2 refills | Status: DC

## 2022-09-13 NOTE — Telephone Encounter (Signed)
Called and left a message with Dr Willeen Cass office to return my call. Told them I was requesting this patients recent labs. Waiting on a return call.

## 2022-09-13 NOTE — Progress Notes (Signed)
WEIGHT SUMMARY AND BIOMETRICS  Vitals Temp: (!) 97.4 F (36.3 C) BP: 124/85 Pulse Rate: (!) 104 SpO2: 98 %   Anthropometric Measurements Height: 5\' 2"  (1.575 m) Weight: 152 lb (68.9 kg) BMI (Calculated): 27.79 Weight at Last Visit: 152lb Weight Lost Since Last Visit: 0 Weight Gained Since Last Visit: 0 Starting Weight: 177lb Total Weight Loss (lbs): 25 lb (11.3 kg)   Body Composition  Body Fat %: 34.2 % Fat Mass (lbs): 52 lbs Muscle Mass (lbs): 95 lbs Total Body Water (lbs): 64.2 lbs Visceral Fat Rating : 7   Other Clinical Data Fasting: Yes Labs: Yes Today's Visit #: 37 Starting Date: 10/02/19    Chief Complaint:   OBESITY [name] Park is here to discuss her progress with her obesity treatment plan. She is on the keeping a food journal and adhering to recommended goals of 1500-1700 calories and 85 protein and states she is following her eating plan approximately 75 % of the time. She states she is exercising indoor walking videos 15-20 minutes 2-3 times per week.   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 She was unable to exercise during an acute infection of LLE- cellulitis.  She has completed ABX course and slowly resumed activity.  09/01/22 OV with Dr. Balan/Endocrinology- Crestor 5mg  reduced from daily to 3 x week and TSH/CMP/A1c/Lipid Panel completed  She will travel to [place] with her family next month- discussed travel strategies with pt.  Subjective:   1. Cellulitis of left lower extremity 08/05/2022 OV Notes: 47 year old female presents for evaluation of symptoms that have been ongoing a week. She denies any fall or injury prior to symptoms starting. She has had MRSA in the past, and does have eczema. She denies any fever or chills. She complains of an open draining lesion to the anterior left lower extremity that is painful and is not improving with any OTC treatments. She also has a small round lesion to the plantar surface  of her left foot, no drainage. She denies any fever or chills, she also denies any hx of DM  Use warm compresses frequently, also take ibuprofen 400 mg every 8 hours along with tylenol 650 mg every 6 hours if needed for pain. Take doxycycline as prescribed and until complete, this can cause sun sensitivity, make sure to wear sunscreen if you will be outside to prevent burning, also take a probiotic to prevent side effects such as diarrhea and yeast infections. Use mupirocin ointment as well to help with this infection. If we need to change your antibiotic when the culture returns you will be contacted. If you have any new or worsening symptoms return for reevaluation.  Provided course of ABX- Doxycycline  2. Vitamin B 12 deficiency  Latest Reference Range & Units 11/23/21 07:58 03/28/22 09:11  Vitamin B12 232 - 1,245 pg/mL 644 754  She is on oral B12 500 mcg QD  3. Hypertriglyceridemia Dr. Talmage Nap had Lipid panel completed 09/01/22- records requested. She is on Vascepa 1g 2 caps BID She denies CP with exertion  09/01/22- Dr. Cristopher Estimable- ran Lipid panel and decreased Crestor 5mg  from daily to three times per week.  She will f/u with Dr. Talmage Nap in August 2024  4. Prediabetes Followed by Endo/Dr. Talmage Nap Last OV on 09/01/22 She is on Metformin 500mg  2 tabs BID Labs completed- records requested She was briefly on Ozempic therapy- unable to tolerate, re: abdominal pain  5. Vitamin D deficiency  Latest Reference Range &  Units 11/23/21 07:58 03/28/22 09:11  Vitamin D, 25-Hydroxy 30.0 - 100.0 ng/mL 54.3 77.8   Assessment/Plan:   1. Cellulitis of left lower extremity Monitor for sx's of re-occurrence  2. Vitamin B 12 deficiency Continue oral B12 supplementation  3. Hypertriglyceridemia Refill - VASCEPA 1 g capsule; TAKE TWO CAPSULES TWICE DAILY  Dispense: 120 capsule; Refill: 2  4. Prediabetes Check Labs - Insulin, random  5. Vitamin D deficiency Check Labs - VITAMIN D 25 Hydroxy  (Vit-D Deficiency, Fractures)  6. BMI 27.0-27.9,adult Current BMI 27.79  [name] Park is currently in the action stage of change. As such, her goal is to continue with weight loss efforts. She has agreed to keeping a food journal and adhering to recommended goals of 1500-1700 calories and 85 protein.   Exercise goals: For substantial health benefits, adults should do at least 150 minutes (2 hours and 30 minutes) a week of moderate-intensity, or 75 minutes (1 hour and 15 minutes) a week of vigorous-intensity aerobic physical activity, or an equivalent combination of moderate- and vigorous-intensity aerobic activity. Aerobic activity should be performed in episodes of at least 10 minutes, and preferably, it should be spread throughout the week.  Behavioral modification strategies: increasing lean protein intake, decreasing simple carbohydrates, increasing vegetables, increasing water intake, no skipping meals, meal planning and cooking strategies, and planning for success.Travel Strategies.  Harue has agreed to follow-up with our clinic in 6 weeks. She was informed of the importance of frequent follow-up visits to maximize her success with intensive lifestyle modifications for her multiple health conditions.   [name] Park was informed we would discuss her lab results at her next visit unless there is a critical issue that needs to be addressed sooner. [name] Park agreed to keep her next visit at the agreed upon time to discuss these results.  Objective:   Blood pressure 124/85, pulse (!) 104, temperature (!) 97.4 F (36.3 C), height 5\' 2"  (1.575 m), weight 152 lb (68.9 kg), SpO2 98 %. Body mass index is 27.8 kg/m.  General: Cooperative, alert, well developed, in no acute distress. HEENT: Conjunctivae and lids unremarkable. Cardiovascular: Regular rhythm.  Lungs: Normal work of breathing. Neurologic: No focal deficits.   Lab Results  Component Value Date   CREATININE 0.84 03/28/2022   BUN 12 03/28/2022    NA 138 03/28/2022   K 4.7 03/28/2022   CL 101 03/28/2022   CO2 21 03/28/2022   Lab Results  Component Value Date   ALT 16 03/28/2022   AST 20 03/28/2022   ALKPHOS 72 03/28/2022   BILITOT 0.2 03/28/2022   Lab Results  Component Value Date   HGBA1C 5.8 (H) 03/28/2022   HGBA1C 5.5 11/23/2021   HGBA1C 5.5 07/28/2021   HGBA1C 5.4 03/28/2021   HGBA1C 5.7 (H) 11/10/2020   Lab Results  Component Value Date   INSULIN 30.4 (H) 03/28/2022   INSULIN 62.3 (H) 11/23/2021   INSULIN 16.8 07/28/2021   INSULIN 21.6 03/28/2021   INSULIN 15.8 11/10/2020   Lab Results  Component Value Date   TSH 1.850 06/03/2020   Lab Results  Component Value Date   CHOL 100 03/28/2022   HDL 41 03/28/2022   LDLCALC 34 03/28/2022   TRIG 149 03/28/2022   CHOLHDL 2.4 03/28/2022   Lab Results  Component Value Date   VD25OH 77.8 03/28/2022   VD25OH 54.3 11/23/2021   VD25OH 62.5 07/28/2021   Lab Results  Component Value Date   WBC 8.1 10/02/2019   HGB 13.3 10/02/2019   HCT 39.1  10/02/2019   MCV 89 10/02/2019   PLT 306 10/02/2019   No results found for: "IRON", "TIBC", "FERRITIN"  Attestation Statements:   Reviewed by clinician on day of visit: allergies, medications, problem list, medical history, surgical history, family history, social history, and previous encounter notes.  I have reviewed the above documentation for accuracy and completeness, and I agree with the above. -  Tamlyn Sides d. Marlaya Turck, NP-C

## 2022-09-14 ENCOUNTER — Encounter (INDEPENDENT_AMBULATORY_CARE_PROVIDER_SITE_OTHER): Payer: Self-pay | Admitting: Adult Health

## 2022-09-14 LAB — INSULIN, RANDOM: INSULIN: 25.7 u[IU]/mL — ABNORMAL HIGH (ref 2.6–24.9)

## 2022-09-14 LAB — VITAMIN D 25 HYDROXY (VIT D DEFICIENCY, FRACTURES): Vit D, 25-Hydroxy: 76 ng/mL (ref 30.0–100.0)

## 2022-09-18 NOTE — Telephone Encounter (Signed)
I called patient and left a voicemail to call us back. Thanks!

## 2022-09-19 DIAGNOSIS — F3189 Other bipolar disorder: Secondary | ICD-10-CM | POA: Diagnosis not present

## 2022-09-19 DIAGNOSIS — F1020 Alcohol dependence, uncomplicated: Secondary | ICD-10-CM | POA: Diagnosis not present

## 2022-09-19 DIAGNOSIS — G43719 Chronic migraine without aura, intractable, without status migrainosus: Secondary | ICD-10-CM | POA: Diagnosis not present

## 2022-09-19 DIAGNOSIS — M542 Cervicalgia: Secondary | ICD-10-CM | POA: Diagnosis not present

## 2022-09-20 DIAGNOSIS — F411 Generalized anxiety disorder: Secondary | ICD-10-CM | POA: Diagnosis not present

## 2022-10-05 DIAGNOSIS — F411 Generalized anxiety disorder: Secondary | ICD-10-CM | POA: Diagnosis not present

## 2022-10-11 DIAGNOSIS — J301 Allergic rhinitis due to pollen: Secondary | ICD-10-CM | POA: Diagnosis not present

## 2022-10-11 DIAGNOSIS — J3089 Other allergic rhinitis: Secondary | ICD-10-CM | POA: Diagnosis not present

## 2022-10-11 DIAGNOSIS — J3081 Allergic rhinitis due to animal (cat) (dog) hair and dander: Secondary | ICD-10-CM | POA: Diagnosis not present

## 2022-10-25 ENCOUNTER — Ambulatory Visit (INDEPENDENT_AMBULATORY_CARE_PROVIDER_SITE_OTHER): Payer: BC Managed Care – PPO | Admitting: Adult Health

## 2022-10-25 ENCOUNTER — Encounter (INDEPENDENT_AMBULATORY_CARE_PROVIDER_SITE_OTHER): Payer: Self-pay | Admitting: Adult Health

## 2022-10-25 VITALS — BP 110/75 | HR 96 | Temp 97.5°F | Ht 62.0 in | Wt 155.0 lb

## 2022-10-25 DIAGNOSIS — E538 Deficiency of other specified B group vitamins: Secondary | ICD-10-CM | POA: Diagnosis not present

## 2022-10-25 DIAGNOSIS — R7303 Prediabetes: Secondary | ICD-10-CM

## 2022-10-25 DIAGNOSIS — Z6828 Body mass index (BMI) 28.0-28.9, adult: Secondary | ICD-10-CM

## 2022-10-25 DIAGNOSIS — Z6827 Body mass index (BMI) 27.0-27.9, adult: Secondary | ICD-10-CM

## 2022-10-25 DIAGNOSIS — E781 Pure hyperglyceridemia: Secondary | ICD-10-CM

## 2022-10-25 DIAGNOSIS — E559 Vitamin D deficiency, unspecified: Secondary | ICD-10-CM | POA: Diagnosis not present

## 2022-10-25 DIAGNOSIS — E669 Obesity, unspecified: Secondary | ICD-10-CM

## 2022-10-25 NOTE — Progress Notes (Signed)
WEIGHT SUMMARY AND BIOMETRICS  Vitals Temp: (!) 97.5 F (36.4 C) BP: 110/75 Pulse Rate: 96 SpO2: 100 %   Anthropometric Measurements Height: 5\' 2"  (1.575 m) Weight: 155 lb (70.3 kg) BMI (Calculated): 28.34 Weight at Last Visit: 152lb Weight Lost Since Last Visit: 0 Weight Gained Since Last Visit: 3lb Starting Weight: 177lb Total Weight Loss (lbs): 22 lb (9.979 kg)   Body Composition  Body Fat %: 34.1 % Fat Mass (lbs): 53 lbs Muscle Mass (lbs): 97 lbs Total Body Water (lbs): 67.4 lbs Visceral Fat Rating : 7   Other Clinical Data Fasting: Yes Labs: Yes Today's Visit #: 10 Starting Date: 10/02/19    Chief Complaint:   OBESITY [name] Park is here to discuss her progress with her obesity treatment plan. She is on the keeping a food journal and adhering to recommended goals of 1500-1700 calories and 85+ protein and states she is following her eating plan approximately 75 % of the time. She states she is exercising Walking 15-20 minutes 2-3 times per week.   Interim History:  She travelled to [place]. [place] for a week long [place] vacation- just returned home 10/22/2022 She endorses consuming more snacks, simple CHO, and cocktails. She was quite active- water sports and hiking.  Reviewed Bioemepednce results with pt: Muscle Mass: +2 lbs Adipose Mass: +1 lb  Of Note- Fax labs Dr. Mitzi Davenport and Dr. Talmage Nap- pt provided contact information of both offices- thank you!  Subjective:   1. Vitamin B 12 deficiency  Latest Reference Range & Units 07/28/21 09:54 11/23/21 07:58 03/28/22 09:11  Vitamin B12 232 - 1,245 pg/mL 167 (L) 644 754  (L): Data is abnormally low  She is on OTV oral B12 daily  2. Prediabetes Lab Results  Component Value Date   HGBA1C 5.8 (H) 03/28/2022   HGBA1C 5.5 11/23/2021   HGBA1C 5.5 07/28/2021  Followed by Endo/Dr. Talmage Nap She is on Metformin 500mg  2 tabs BID Labs completed- records requested She was briefly on Ozempic therapy-  unable to tolerate, re: abdominal pain  3. Vitamin D deficiency  Latest Reference Range & Units 09/13/22 08:18  Vitamin D, 25-Hydroxy 30.0 - 100.0 ng/mL 76.0   She is on OTC Vit D3 1,000 international units daily    4. Hypertriglyceridemia Lipid Panel     Component Value Date/Time   CHOL 100 03/28/2022 0911   TRIG 149 03/28/2022 0911   HDL 41 03/28/2022 0911   CHOLHDL 2.4 03/28/2022 0911   LDLCALC 34 03/28/2022 0911   LABVLDL 25 03/28/2022 0911   The ASCVD Risk score (Arnett DK, et al., 2019) failed to calculate for the following reasons:   The valid total cholesterol range is 130 to 320 mg/dL  She is on Vascepa 1g 2 caps BID She denies CP with exertion  Assessment/Plan:   1. Vitamin B 12 deficiency Check Labs - Vitamin B12 Continue OTC Supplementation  2. Prediabetes Check Labs - Hemoglobin A1c  3. Vitamin D deficiency Continue OTC Supplementation  4. Hypertriglyceridemia Check Labs - Comprehensive metabolic panel - Lipid panel  5. BMI 27.0-27.9,adult Current BMI 28.34  [name] Park is currently in the action stage of change. As such, her goal is to continue with weight loss efforts. She has agreed to keeping a food journal and adhering to recommended goals of 1500-1700 calories and 85+ protein.   Exercise goals: For substantial health benefits, adults should do at least 150 minutes (2 hours and 30 minutes) a week of moderate-intensity, or 75 minutes (1  hour and 15 minutes) a week of vigorous-intensity aerobic physical activity, or an equivalent combination of moderate- and vigorous-intensity aerobic activity. Aerobic activity should be performed in episodes of at least 10 minutes, and preferably, it should be spread throughout the week.  Behavioral modification strategies: increasing lean protein intake, decreasing simple carbohydrates, increasing vegetables, increasing water intake, decreasing eating out, no skipping meals, meal planning and cooking strategies, planning  for success, and keeping a strict food journal.  [name] Park has agreed to follow-up with our clinic in 4 weeks. She was informed of the importance of frequent follow-up visits to maximize her success with intensive lifestyle modifications for her multiple health conditions.   [name] Park was informed we would discuss her lab results at her next visit unless there is a critical issue that needs to be addressed sooner. [name] Park agreed to keep her next visit at the agreed upon time to discuss these results.  Objective:   Blood pressure 110/75, pulse 96, temperature (!) 97.5 F (36.4 C), height 5\' 2"  (1.575 m), weight 155 lb (70.3 kg), SpO2 100%. Body mass index is 28.35 kg/m.  General: Cooperative, alert, well developed, in no acute distress. HEENT: Conjunctivae and lids unremarkable. Cardiovascular: Regular rhythm.  Lungs: Normal work of breathing. Neurologic: No focal deficits.   Lab Results  Component Value Date   CREATININE 0.84 03/28/2022   BUN 12 03/28/2022   NA 138 03/28/2022   K 4.7 03/28/2022   CL 101 03/28/2022   CO2 21 03/28/2022   Lab Results  Component Value Date   ALT 16 03/28/2022   AST 20 03/28/2022   ALKPHOS 72 03/28/2022   BILITOT 0.2 03/28/2022   Lab Results  Component Value Date   HGBA1C 5.8 (H) 03/28/2022   HGBA1C 5.5 11/23/2021   HGBA1C 5.5 07/28/2021   HGBA1C 5.4 03/28/2021   HGBA1C 5.7 (H) 11/10/2020   Lab Results  Component Value Date   INSULIN 25.7 (H) 09/13/2022   INSULIN 30.4 (H) 03/28/2022   INSULIN 62.3 (H) 11/23/2021   INSULIN 16.8 07/28/2021   INSULIN 21.6 03/28/2021   Lab Results  Component Value Date   TSH 1.850 06/03/2020   Lab Results  Component Value Date   CHOL 100 03/28/2022   HDL 41 03/28/2022   LDLCALC 34 03/28/2022   TRIG 149 03/28/2022   CHOLHDL 2.4 03/28/2022   Lab Results  Component Value Date   VD25OH 76.0 09/13/2022   VD25OH 77.8 03/28/2022   VD25OH 54.3 11/23/2021   Lab Results  Component Value Date   WBC 8.1  10/02/2019   HGB 13.3 10/02/2019   HCT 39.1 10/02/2019   MCV 89 10/02/2019   PLT 306 10/02/2019   No results found for: "IRON", "TIBC", "FERRITIN"  Attestation Statements:   Reviewed by clinician on day of visit: allergies, medications, problem list, medical history, surgical history, family history, social history, and previous encounter notes.  I have reviewed the above documentation for accuracy and completeness, and I agree with the above. -  Grafton Warzecha d. Dyanna Seiter, NP-C

## 2022-10-26 ENCOUNTER — Telehealth (INDEPENDENT_AMBULATORY_CARE_PROVIDER_SITE_OTHER): Payer: Self-pay

## 2022-10-26 DIAGNOSIS — F411 Generalized anxiety disorder: Secondary | ICD-10-CM | POA: Diagnosis not present

## 2022-10-26 LAB — COMPREHENSIVE METABOLIC PANEL
ALT: 14 IU/L (ref 0–32)
AST: 19 IU/L (ref 0–40)
Albumin: 4 g/dL (ref 3.9–4.9)
Alkaline Phosphatase: 59 IU/L (ref 44–121)
BUN/Creatinine Ratio: 17 (ref 9–23)
BUN: 14 mg/dL (ref 6–24)
Bilirubin Total: <0.2 mg/dL (ref 0.0–1.2)
CO2: 22 mmol/L (ref 20–29)
Calcium: 9.4 mg/dL (ref 8.7–10.2)
Chloride: 101 mmol/L (ref 96–106)
Creatinine, Ser: 0.83 mg/dL (ref 0.57–1.00)
Globulin, Total: 2.3 g/dL (ref 1.5–4.5)
Glucose: 84 mg/dL (ref 70–99)
Potassium: 5.1 mmol/L (ref 3.5–5.2)
Sodium: 138 mmol/L (ref 134–144)
Total Protein: 6.3 g/dL (ref 6.0–8.5)
eGFR: 89 mL/min/{1.73_m2} (ref >59)

## 2022-10-26 LAB — HEMOGLOBIN A1C
Est. average glucose Bld gHb Est-mCnc: 114 mg/dL
Hgb A1c MFr Bld: 5.6 % (ref 4.8–5.6)

## 2022-10-26 LAB — LIPID PANEL
Chol/HDL Ratio: 2.4 ratio (ref 0.0–4.4)
Cholesterol, Total: 114 mg/dL (ref 100–199)
HDL: 48 mg/dL (ref >39)
LDL Chol Calc (NIH): 43 mg/dL (ref 0–99)
Triglycerides: 132 mg/dL (ref 0–149)
VLDL Cholesterol Cal: 23 mg/dL (ref 5–40)

## 2022-10-26 LAB — VITAMIN B12: Vitamin B-12: 959 pg/mL (ref 232–1245)

## 2022-10-26 NOTE — Telephone Encounter (Signed)
Faxed Labs from 10/25/22 to both Dr Garrel Ridgel at [phone] and Dr Talmage Nap at [phone]. 10:19 10/26/22 KP

## 2022-11-01 DIAGNOSIS — M542 Cervicalgia: Secondary | ICD-10-CM | POA: Diagnosis not present

## 2022-11-01 DIAGNOSIS — G43719 Chronic migraine without aura, intractable, without status migrainosus: Secondary | ICD-10-CM | POA: Diagnosis not present

## 2022-11-08 DIAGNOSIS — J3081 Allergic rhinitis due to animal (cat) (dog) hair and dander: Secondary | ICD-10-CM | POA: Diagnosis not present

## 2022-11-08 DIAGNOSIS — J3089 Other allergic rhinitis: Secondary | ICD-10-CM | POA: Diagnosis not present

## 2022-11-08 DIAGNOSIS — J301 Allergic rhinitis due to pollen: Secondary | ICD-10-CM | POA: Diagnosis not present

## 2022-11-09 DIAGNOSIS — F411 Generalized anxiety disorder: Secondary | ICD-10-CM | POA: Diagnosis not present

## 2022-11-21 DIAGNOSIS — Z6829 Body mass index (BMI) 29.0-29.9, adult: Secondary | ICD-10-CM | POA: Diagnosis not present

## 2022-11-21 DIAGNOSIS — Z1231 Encounter for screening mammogram for malignant neoplasm of breast: Secondary | ICD-10-CM | POA: Diagnosis not present

## 2022-11-21 DIAGNOSIS — Z01419 Encounter for gynecological examination (general) (routine) without abnormal findings: Secondary | ICD-10-CM | POA: Diagnosis not present

## 2022-11-23 DIAGNOSIS — F411 Generalized anxiety disorder: Secondary | ICD-10-CM | POA: Diagnosis not present

## 2022-11-28 ENCOUNTER — Ambulatory Visit (INDEPENDENT_AMBULATORY_CARE_PROVIDER_SITE_OTHER): Payer: BC Managed Care – PPO | Admitting: Adult Health

## 2022-11-28 ENCOUNTER — Telehealth (INDEPENDENT_AMBULATORY_CARE_PROVIDER_SITE_OTHER): Payer: Self-pay

## 2022-11-28 ENCOUNTER — Encounter (INDEPENDENT_AMBULATORY_CARE_PROVIDER_SITE_OTHER): Payer: Self-pay | Admitting: Adult Health

## 2022-11-28 VITALS — BP 111/74 | HR 106 | Temp 98.0°F | Ht 62.0 in | Wt 152.0 lb

## 2022-11-28 DIAGNOSIS — E781 Pure hyperglyceridemia: Secondary | ICD-10-CM | POA: Diagnosis not present

## 2022-11-28 DIAGNOSIS — E559 Vitamin D deficiency, unspecified: Secondary | ICD-10-CM | POA: Diagnosis not present

## 2022-11-28 DIAGNOSIS — Z6827 Body mass index (BMI) 27.0-27.9, adult: Secondary | ICD-10-CM

## 2022-11-28 DIAGNOSIS — R7303 Prediabetes: Secondary | ICD-10-CM | POA: Diagnosis not present

## 2022-11-28 DIAGNOSIS — E538 Deficiency of other specified B group vitamins: Secondary | ICD-10-CM | POA: Diagnosis not present

## 2022-11-28 DIAGNOSIS — E669 Obesity, unspecified: Secondary | ICD-10-CM

## 2022-11-28 NOTE — Telephone Encounter (Signed)
Labs results faxed to [facility]  [phone] and Dr Talmage Nap [phone].

## 2022-11-28 NOTE — Progress Notes (Signed)
WEIGHT SUMMARY AND BIOMETRICS  Vitals Temp: 98 F (36.7 C) BP: 111/74 Pulse Rate: (!) 106 SpO2: 99 %   Anthropometric Measurements Height: 5\' 2"  (1.575 m) Weight: 152 lb (68.9 kg) BMI (Calculated): 27.79 Weight at Last Visit: 155 lb Weight Lost Since Last Visit: 3 lb Weight Gained Since Last Visit: 0 Starting Weight: 177 lb Total Weight Loss (lbs): 25 lb (11.3 kg) Peak Weight: 180 lb   Body Composition  Body Fat %: 33.7 % Fat Mass (lbs): 51.2 lbs Muscle Mass (lbs): 95.8 lbs Total Body Water (lbs): 64.6 lbs Visceral Fat Rating : 6   Other Clinical Data Fasting: no Labs: no Today's Visit #: 65 Starting Date: 10/02/19    Chief Complaint:   OBESITY [name] Park is here to discuss her progress with her obesity treatment plan. She is on the keeping a food journal and adhering to recommended goals of 1500-1700 calories and 85+ protein and states she is following her eating plan approximately 75 % of the time. She states she is exercising Brisk Walking 15-20 minutes 2 times per week.   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  Hunger/appetite-she denies polyphagia.  Exercise-brisk walking at least 2 x week   Subjective:   1. Vitamin D deficiency Discussed Labs  Latest Reference Range & Units 09/13/22 08:18  Vitamin D, 25-Hydroxy 30.0 - 100.0 ng/mL 76.0  Level at goal She is on daily OTC Vit D3 1,000 international units    2. Prediabetes Discussed Labs  Latest Reference Range & Units 09/13/22 08:18 10/25/22 09:02  Glucose 70 - 99 mg/dL  84  Hemoglobin Z6X 4.8 - 5.6 %  5.6  Est. average glucose Bld gHb Est-mCnc mg/dL  096  INSULIN 2.6 - 04.5 uIU/mL 25.7 (H)   (H): Data is abnormally high  CBG and A1c both at goal Insulin level improving, however still elevated Dr. Cristopher Estimable manages Metfromin 500mg  2 tabs BID with meals  3.  Hypertriglyceridemia Discussed Labs Lipid Panel     Component Value Date/Time   CHOL 114 10/25/2022 0902   TRIG 132 10/25/2022 0902   HDL 48 10/25/2022 0902   CHOLHDL 2.4 10/25/2022 0902   LDLCALC 43 10/25/2022 0902   LABVLDL 23 10/25/2022 0902   The ASCVD Risk score (Arnett DK, et al., 2019) failed to calculate for the following reasons:   The valid total cholesterol range is 130 to 320 mg/dL   Lipid panel at goal Dr. Talmage Nap recently changed Crestor 5mg  from daily to 3 x week- since June 2024 Remained on Vascepa 1g 2 caps BID (managed by [facility])  4. Vitamin B 12 deficiency Discussed Labs  Latest Reference Range & Units 10/25/22 09:02  Vitamin B12 232 - 1,245 pg/mL 959   Level at goal She is on daily oral Cyanobalamin every day  She reports fatigue, however feels that is it likely r/t to new/longer work days  Assessment/Plan:   1. Vitamin D deficiency Continue OTC Supplementation  2. Prediabetes Continue Metformin per Dr. Cristopher Estimable  3. Hypertriglyceridemia Continue Vascepa 1g 2 tabs BID- does not require RF today  4. Vitamin B 12 deficiency Continue daily Cyanocobalamin   5. BMI 27.0-27.9,adult Current BMI 27.8  [name] Park is currently in the action stage of change. As such, her goal is to continue with weight loss efforts. She has agreed to keeping a food journal and adhering to recommended  goals of 1500-1700 calories and +90+ protein.   To maintain muscle mass, increase protein intake to at least 90g protein/day  Labs faxed to Dr. Mitzi Davenport (Psych) and Dr. Talmage Nap (Endo)  Exercise goals: For substantial health benefits, adults should do at least 150 minutes (2 hours and 30 minutes) a week of moderate-intensity, or 75 minutes (1 hour and 15 minutes) a week of vigorous-intensity aerobic physical activity, or an equivalent combination of moderate- and vigorous-intensity aerobic activity. Aerobic activity should be performed in episodes of at least 10 minutes, and  preferably, it should be spread throughout the week.  BANDED EXERCISES  Behavioral modification strategies: increasing lean protein intake, decreasing simple carbohydrates, increasing vegetables, increasing water intake, no skipping meals, meal planning and cooking strategies, keeping healthy foods in the home, planning for success, and keeping a strict food journal.  [name] Park has agreed to follow-up with our clinic in 4 weeks. She was informed of the importance of frequent follow-up visits to maximize her success with intensive lifestyle modifications for her multiple health conditions.   Objective:   Blood pressure 111/74, pulse (!) 106, temperature 98 F (36.7 C), height 5\' 2"  (1.575 m), weight 152 lb (68.9 kg), SpO2 99%. Body mass index is 27.8 kg/m.  General: Cooperative, alert, well developed, in no acute distress. HEENT: Conjunctivae and lids unremarkable. Cardiovascular: Regular rhythm.  Lungs: Normal work of breathing. Neurologic: No focal deficits.   Lab Results  Component Value Date   CREATININE 0.83 10/25/2022   BUN 14 10/25/2022   NA 138 10/25/2022   K 5.1 10/25/2022   CL 101 10/25/2022   CO2 22 10/25/2022   Lab Results  Component Value Date   ALT 14 10/25/2022   AST 19 10/25/2022   ALKPHOS 59 10/25/2022   BILITOT <0.2 10/25/2022   Lab Results  Component Value Date   HGBA1C 5.6 10/25/2022   HGBA1C 5.8 (H) 03/28/2022   HGBA1C 5.5 11/23/2021   HGBA1C 5.5 07/28/2021   HGBA1C 5.4 03/28/2021   Lab Results  Component Value Date   INSULIN 25.7 (H) 09/13/2022   INSULIN 30.4 (H) 03/28/2022   INSULIN 62.3 (H) 11/23/2021   INSULIN 16.8 07/28/2021   INSULIN 21.6 03/28/2021   Lab Results  Component Value Date   TSH 1.850 06/03/2020   Lab Results  Component Value Date   CHOL 114 10/25/2022   HDL 48 10/25/2022   LDLCALC 43 10/25/2022   TRIG 132 10/25/2022   CHOLHDL 2.4 10/25/2022   Lab Results  Component Value Date   VD25OH 76.0 09/13/2022   VD25OH 77.8  03/28/2022   VD25OH 54.3 11/23/2021   Lab Results  Component Value Date   WBC 8.1 10/02/2019   HGB 13.3 10/02/2019   HCT 39.1 10/02/2019   MCV 89 10/02/2019   PLT 306 10/02/2019   No results found for: "IRON", "TIBC", "FERRITIN"  Attestation Statements:   Reviewed by clinician on day of visit: allergies, medications, problem list, medical history, surgical history, family history, social history, and previous encounter notes.  I have reviewed the above documentation for accuracy and completeness, and I agree with the above. -  Stellarose Cerny d. Waldo Damian, NP-C

## 2022-12-06 DIAGNOSIS — J301 Allergic rhinitis due to pollen: Secondary | ICD-10-CM | POA: Diagnosis not present

## 2022-12-06 DIAGNOSIS — Z23 Encounter for immunization: Secondary | ICD-10-CM | POA: Diagnosis not present

## 2022-12-06 DIAGNOSIS — J3089 Other allergic rhinitis: Secondary | ICD-10-CM | POA: Diagnosis not present

## 2022-12-06 DIAGNOSIS — J3081 Allergic rhinitis due to animal (cat) (dog) hair and dander: Secondary | ICD-10-CM | POA: Diagnosis not present

## 2022-12-13 DIAGNOSIS — G43719 Chronic migraine without aura, intractable, without status migrainosus: Secondary | ICD-10-CM | POA: Diagnosis not present

## 2022-12-13 DIAGNOSIS — M542 Cervicalgia: Secondary | ICD-10-CM | POA: Diagnosis not present

## 2022-12-21 DIAGNOSIS — F411 Generalized anxiety disorder: Secondary | ICD-10-CM | POA: Diagnosis not present

## 2023-01-03 DIAGNOSIS — J3089 Other allergic rhinitis: Secondary | ICD-10-CM | POA: Diagnosis not present

## 2023-01-03 DIAGNOSIS — J3081 Allergic rhinitis due to animal (cat) (dog) hair and dander: Secondary | ICD-10-CM | POA: Diagnosis not present

## 2023-01-03 DIAGNOSIS — J301 Allergic rhinitis due to pollen: Secondary | ICD-10-CM | POA: Diagnosis not present

## 2023-01-09 ENCOUNTER — Ambulatory Visit (INDEPENDENT_AMBULATORY_CARE_PROVIDER_SITE_OTHER): Payer: BC Managed Care – PPO | Admitting: Adult Health

## 2023-01-09 ENCOUNTER — Encounter (INDEPENDENT_AMBULATORY_CARE_PROVIDER_SITE_OTHER): Payer: Self-pay | Admitting: Adult Health

## 2023-01-09 VITALS — BP 111/77 | HR 108 | Temp 97.8°F | Ht 62.0 in | Wt 154.0 lb

## 2023-01-09 DIAGNOSIS — R7303 Prediabetes: Secondary | ICD-10-CM | POA: Diagnosis not present

## 2023-01-09 DIAGNOSIS — E559 Vitamin D deficiency, unspecified: Secondary | ICD-10-CM | POA: Diagnosis not present

## 2023-01-09 DIAGNOSIS — E781 Pure hyperglyceridemia: Secondary | ICD-10-CM

## 2023-01-09 DIAGNOSIS — E669 Obesity, unspecified: Secondary | ICD-10-CM | POA: Diagnosis not present

## 2023-01-09 DIAGNOSIS — Z6827 Body mass index (BMI) 27.0-27.9, adult: Secondary | ICD-10-CM

## 2023-01-09 DIAGNOSIS — Z0000 Encounter for general adult medical examination without abnormal findings: Secondary | ICD-10-CM

## 2023-01-09 DIAGNOSIS — Z6828 Body mass index (BMI) 28.0-28.9, adult: Secondary | ICD-10-CM

## 2023-01-09 MED ORDER — VASCEPA 1 G PO CAPS: ORAL_CAPSULE | ORAL | 2 refills | Status: DC

## 2023-01-09 NOTE — Progress Notes (Signed)
WEIGHT SUMMARY AND BIOMETRICS  Vitals Temp: 97.8 F (36.6 C) BP: 111/77 Pulse Rate: (!) 108 SpO2: 95 %   Anthropometric Measurements Height: 5\' 2"  (1.575 m) Weight: 154 lb (69.9 kg) BMI (Calculated): 28.16 Weight at Last Visit: 152 lb Weight Lost Since Last Visit: 0 Weight Gained Since Last Visit: 2 lb Starting Weight: 177 lb Total Weight Loss (lbs): 23 lb (10.4 kg) Peak Weight: 180 lb   Body Composition  Body Fat %: 35.9 % Fat Mass (lbs): 55.6 lbs Muscle Mass (lbs): 94.2 lbs Total Body Water (lbs): 63.8 lbs Visceral Fat Rating : 7   Other Clinical Data Fasting: no Labs: no Today's Visit #: 40 Starting Date: 10/02/19    Chief Complaint:   OBESITY [name] Park is here to discuss her progress with her obesity treatment plan.  She is on the keeping a food journal and adhering to recommended goals of 1500-1700 calories and 85+ protein and states she is following her eating plan approximately 75 % of the time.  She states she is exercising walking 15-20 minutes 1-2 times per week.   Interim History:  She and her family travelled to [place] in August 2024 Inclusive resort Activities: Community education officer   Current weight 154 lbs with corresponding BMI 28.3  Goal- to remain in low 150s over holidays.  She reports increased fatigue. Upon return home from [place] trip, she increased work hours from PT to [employer] Current work hours [phone] with 1 hr lunch break- Monday-Friday She also states "it is draining to listen to people's stories"- [employer]- she is front desk/office assistant  Subjective:   1. Healthcare maintenance 09/2022 CMP- electrolytes, kidney/liver enzymes stable  2. Vitamin D deficiency  Latest Reference Range & Units 03/28/22 09:11 09/13/22 08:18  Vitamin D, 25-Hydroxy 30.0 - 100.0 ng/mL 77.8 76.0   She endorses chronic fatigue  Vit D level at goal She will consume "an energy drink" daily on  weekdays, ie: Celsius OTC Vit D 3 1,000 international units daily   3. Prediabetes Lab Results  Component Value Date   HGBA1C 5.6 10/25/2022   HGBA1C 5.8 (H) 03/28/2022   HGBA1C 5.5 11/23/2021     Latest Reference Range & Units 11/23/21 07:58 03/28/22 09:11 09/13/22 08:18  INSULIN 2.6 - 24.9 uIU/mL 62.3 (H) 30.4 (H) 25.7 (H)  (H): Data is abnormally high  Endo/Dr. Talmage Nap manages Metformin 500mg   2 tabs BID- she denies SE Previously trailed on Ozempic -unable to tolerate due to persistent abdominal pain.  4. Hypertriglyceridemia Lipid Panel     Component Value Date/Time   CHOL 114 10/25/2022 0902   TRIG 132 10/25/2022 0902   HDL 48 10/25/2022 0902   CHOLHDL 2.4 10/25/2022 0902   LDLCALC 43 10/25/2022 0902   LABVLDL 23 10/25/2022 0902     Latest Reference Range & Units 10/25/22 09:02  Alkaline Phosphatase 44 - 121 IU/L 59  Albumin 3.9 - 4.9 g/dL 4.0  AST 0 - 40 IU/L 19  ALT 0 - 32 IU/L 14    She is on Vascepa 1g- 2 caps BID- denies SE She denies tobacco/vape use  Assessment/Plan:   1. Healthcare maintenance Continue healthy eating and increase regular walking.  2. Vitamin D deficiency Continue OTC Vit D 3 1,000 international units daily   3. Prediabetes Continue Metformin per Endo  4. Hypertriglyceridemia Refill - VASCEPA 1 g capsule; TAKE TWO CAPSULES TWICE DAILY  Dispense: 120 capsule; Refill: 2  5. BMI 27.0-27.9,adult Current  BMI 28.3  [name] Park is currently in the action stage of change. As such, her goal is to continue with weight loss efforts. She has agreed to keeping a food journal and adhering to recommended goals of 1500-1700 calories and 85+ protein.   Exercise goals: For substantial health benefits, adults should do at least 150 minutes (2 hours and 30 minutes) a week of moderate-intensity, or 75 minutes (1 hour and 15 minutes) a week of vigorous-intensity aerobic physical activity, or an equivalent combination of moderate- and vigorous-intensity aerobic  activity. Aerobic activity should be performed in episodes of at least 10 minutes, and preferably, it should be spread throughout the week.  Behavioral modification strategies: increasing lean protein intake, decreasing simple carbohydrates, increasing vegetables, increasing water intake, decreasing liquid calories, meal planning and cooking strategies, keeping healthy foods in the home, ways to avoid boredom eating, holiday eating strategies , avoiding temptations, planning for success, and keeping a strict food journal.  [name] Park has agreed to follow-up with our clinic in 4 weeks. She was informed of the importance of frequent follow-up visits to maximize her success with intensive lifestyle modifications for her multiple health conditions.   Objective:   Blood pressure 111/77, pulse (!) 108, temperature 97.8 F (36.6 C), height 5\' 2"  (1.575 m), weight 154 lb (69.9 kg), SpO2 95%. Body mass index is 28.17 kg/m.  General: Cooperative, alert, well developed, in no acute distress. HEENT: Conjunctivae and lids unremarkable. Cardiovascular: Regular rhythm.  Lungs: Normal work of breathing. Neurologic: No focal deficits.   Lab Results  Component Value Date   CREATININE 0.83 10/25/2022   BUN 14 10/25/2022   NA 138 10/25/2022   K 5.1 10/25/2022   CL 101 10/25/2022   CO2 22 10/25/2022   Lab Results  Component Value Date   ALT 14 10/25/2022   AST 19 10/25/2022   ALKPHOS 59 10/25/2022   BILITOT <0.2 10/25/2022   Lab Results  Component Value Date   HGBA1C 5.6 10/25/2022   HGBA1C 5.8 (H) 03/28/2022   HGBA1C 5.5 11/23/2021   HGBA1C 5.5 07/28/2021   HGBA1C 5.4 03/28/2021   Lab Results  Component Value Date   INSULIN 25.7 (H) 09/13/2022   INSULIN 30.4 (H) 03/28/2022   INSULIN 62.3 (H) 11/23/2021   INSULIN 16.8 07/28/2021   INSULIN 21.6 03/28/2021   Lab Results  Component Value Date   TSH 1.850 06/03/2020   Lab Results  Component Value Date   CHOL 114 10/25/2022   HDL 48  10/25/2022   LDLCALC 43 10/25/2022   TRIG 132 10/25/2022   CHOLHDL 2.4 10/25/2022   Lab Results  Component Value Date   VD25OH 76.0 09/13/2022   VD25OH 77.8 03/28/2022   VD25OH 54.3 11/23/2021   Lab Results  Component Value Date   WBC 8.1 10/02/2019   HGB 13.3 10/02/2019   HCT 39.1 10/02/2019   MCV 89 10/02/2019   PLT 306 10/02/2019   No results found for: "IRON", "TIBC", "FERRITIN"  Attestation Statements:   Reviewed by clinician on day of visit: allergies, medications, problem list, medical history, surgical history, family history, social history, and previous encounter notes  I have reviewed the above documentation for accuracy and completeness, and I agree with the above. -  Jani Ploeger d. Anani Gu,NP-C

## 2023-01-10 DIAGNOSIS — F3189 Other bipolar disorder: Secondary | ICD-10-CM | POA: Diagnosis not present

## 2023-01-10 DIAGNOSIS — F1020 Alcohol dependence, uncomplicated: Secondary | ICD-10-CM | POA: Diagnosis not present

## 2023-01-11 ENCOUNTER — Encounter (INDEPENDENT_AMBULATORY_CARE_PROVIDER_SITE_OTHER): Payer: Self-pay

## 2023-01-11 ENCOUNTER — Telehealth (INDEPENDENT_AMBULATORY_CARE_PROVIDER_SITE_OTHER): Payer: Self-pay

## 2023-01-11 DIAGNOSIS — G43909 Migraine, unspecified, not intractable, without status migrainosus: Secondary | ICD-10-CM | POA: Insufficient documentation

## 2023-01-11 NOTE — Telephone Encounter (Signed)
Prior auth started for Vascepa.  Awaiting questions.

## 2023-01-11 NOTE — Telephone Encounter (Signed)
Approved today by [facility] 2017 Approved. Authorization Expiration Date: 01/11/2024

## 2023-01-11 NOTE — Telephone Encounter (Signed)
Prior auth questions submitted.  Awaiting determination.

## 2023-01-18 DIAGNOSIS — L821 Other seborrheic keratosis: Secondary | ICD-10-CM | POA: Diagnosis not present

## 2023-01-18 DIAGNOSIS — D225 Melanocytic nevi of trunk: Secondary | ICD-10-CM | POA: Diagnosis not present

## 2023-01-18 DIAGNOSIS — L718 Other rosacea: Secondary | ICD-10-CM | POA: Diagnosis not present

## 2023-01-18 DIAGNOSIS — F411 Generalized anxiety disorder: Secondary | ICD-10-CM | POA: Diagnosis not present

## 2023-01-18 DIAGNOSIS — L814 Other melanin hyperpigmentation: Secondary | ICD-10-CM | POA: Diagnosis not present

## 2023-01-24 DIAGNOSIS — M542 Cervicalgia: Secondary | ICD-10-CM | POA: Diagnosis not present

## 2023-01-24 DIAGNOSIS — G43719 Chronic migraine without aura, intractable, without status migrainosus: Secondary | ICD-10-CM | POA: Diagnosis not present

## 2023-01-31 DIAGNOSIS — J301 Allergic rhinitis due to pollen: Secondary | ICD-10-CM | POA: Diagnosis not present

## 2023-01-31 DIAGNOSIS — J3089 Other allergic rhinitis: Secondary | ICD-10-CM | POA: Diagnosis not present

## 2023-01-31 DIAGNOSIS — J3081 Allergic rhinitis due to animal (cat) (dog) hair and dander: Secondary | ICD-10-CM | POA: Diagnosis not present

## 2023-02-14 ENCOUNTER — Encounter (INDEPENDENT_AMBULATORY_CARE_PROVIDER_SITE_OTHER): Payer: Self-pay | Admitting: Adult Health

## 2023-02-14 ENCOUNTER — Ambulatory Visit (INDEPENDENT_AMBULATORY_CARE_PROVIDER_SITE_OTHER): Payer: BC Managed Care – PPO | Admitting: Adult Health

## 2023-02-14 VITALS — BP 111/76 | HR 109 | Temp 97.5°F | Ht 62.0 in | Wt 155.0 lb

## 2023-02-14 DIAGNOSIS — R000 Tachycardia, unspecified: Secondary | ICD-10-CM

## 2023-02-14 DIAGNOSIS — E781 Pure hyperglyceridemia: Secondary | ICD-10-CM

## 2023-02-14 DIAGNOSIS — R7303 Prediabetes: Secondary | ICD-10-CM | POA: Diagnosis not present

## 2023-02-14 DIAGNOSIS — E669 Obesity, unspecified: Secondary | ICD-10-CM | POA: Diagnosis not present

## 2023-02-14 DIAGNOSIS — Z6828 Body mass index (BMI) 28.0-28.9, adult: Secondary | ICD-10-CM

## 2023-02-14 DIAGNOSIS — E66811 Obesity, class 1: Secondary | ICD-10-CM

## 2023-02-14 NOTE — Progress Notes (Signed)
WEIGHT SUMMARY AND BIOMETRICS  Vitals Temp: (!) 97.5 F (36.4 C) BP: 111/76 Pulse Rate: (!) 109 SpO2: 97 %   Anthropometric Measurements Height: 5\' 2"  (1.575 m) Weight: 155 lb (70.3 kg) BMI (Calculated): 28.34 Weight at Last Visit: 154lb Weight Lost Since Last Visit: 0 Weight Gained Since Last Visit: 1lb Starting Weight: 177lb Total Weight Loss (lbs): 22 lb (9.979 kg) Peak Weight: 180lb   Body Composition  Body Fat %: 33.9 % Fat Mass (lbs): 52.8 lbs Muscle Mass (lbs): 97.6 lbs Total Body Water (lbs): 66 lbs Visceral Fat Rating : 7   Other Clinical Data Fasting: no Labs: no Today's Visit #: 41 Starting Date: 10/02/19    Chief Complaint:   OBESITY [name] Park is here to discuss her progress with her obesity treatment plan. She is on the keeping a food journal and adhering to recommended goals of 1500-1700 calories and 85+ protein and states she is following her eating plan approximately 60-75 % of the time. She states she is exercising NE[employer] Activities minutes.   Interim History:  [name] Park is still enjoying her new job! She will have a paid holiday break 02/22/2023-03/08/2023  Her family will celebrate her mother's birthday this evening- dinner out.  Exercise-she is experiencing R knee pain and unable to exercise currently  Hydration- She estimates to drink 8-10 oz plain water a day. She will wake, take meds with Coke Zero She will have 6-8 oz coffee Celsius energy drink at work 1 full soda during work  Subjective:   1. Increased heart rate EPIC Synopsis reviewed The days that her HR < 100- she was fasting for labs (10/16/2019, 06/03/2020, 07/22/2020, 03/28/2021, 11/23/2021, 10/25/2022) When she fasted she did not drink coffee prior to OV She drinks very little water so she is likely in a dehydrated state which keeps her resting HR elevated. She denies CP/palpitations  2. Hypertriglyceridemia HWW manages Vescepa 1g two caps BID Endocrinology manages  daily Crestor 5mg   3. Prediabetes Dr. Balan/Endocrinology manages metformin 500mg  2 tabs BID She was unable to tolerated [facility] therapy, re: GI upset She denies GI upset at present  Assessment/Plan:   1. Increased heart rate Limit daily caffeine : 1 cup coffee, 1 soda/day Increase water intake to at least 20 oz/day Ultimate goal is to work up 60-80 oz/day  2. Hypertriglyceridemia Continue spironolactone (ALDACTONE) 50 MG tablet  rosuvastatin (CRESTOR) 5 MG tablet  VASCEPA 1 g capsule   3. Prediabetes Continue metformin 500mg  2 tabs BID per Dr. Balan/Endocrinology  4. Obesity with current BMI 28.34  [name] Park is currently in the action stage of change. As such, her goal is to continue with weight loss efforts. She has agreed to keeping a food journal and adhering to recommended goals of 1500-1700 calories and 85+ protein.   Exercise goals: No exercise has been prescribed at this time.  Behavioral modification strategies: increasing lean protein intake, decreasing simple carbohydrates, increasing vegetables, increasing water intake, no skipping meals, meal planning and cooking strategies, keeping healthy foods in the home, ways to avoid boredom eating, holiday eating strategies , and planning for success.  [name] has agreed to follow-up with our clinic in 4 weeks. She was informed of the importance of frequent follow-up visits to maximize her success with intensive lifestyle modifications for her multiple health conditions.   Establish with PCP- list of providers given to patient  Objective:   Blood pressure 111/76, pulse (!) 109, temperature (!) 97.5 F (36.4 C), height 5\' 2"  (1.575  m), weight 155 lb (70.3 kg), SpO2 97%. Body mass index is 28.35 kg/m.  General: Cooperative, alert, well developed, in no acute distress. HEENT: Conjunctivae and lids unremarkable. Cardiovascular: Regular rhythm.  Lungs: Normal work of breathing. Neurologic: No focal deficits.   Lab Results   Component Value Date   CREATININE 0.83 10/25/2022   BUN 14 10/25/2022   NA 138 10/25/2022   K 5.1 10/25/2022   CL 101 10/25/2022   CO2 22 10/25/2022   Lab Results  Component Value Date   ALT 14 10/25/2022   AST 19 10/25/2022   ALKPHOS 59 10/25/2022   BILITOT <0.2 10/25/2022   Lab Results  Component Value Date   HGBA1C 5.6 10/25/2022   HGBA1C 5.8 (H) 03/28/2022   HGBA1C 5.5 11/23/2021   HGBA1C 5.5 07/28/2021   HGBA1C 5.4 03/28/2021   Lab Results  Component Value Date   INSULIN 25.7 (H) 09/13/2022   INSULIN 30.4 (H) 03/28/2022   INSULIN 62.3 (H) 11/23/2021   INSULIN 16.8 07/28/2021   INSULIN 21.6 03/28/2021   Lab Results  Component Value Date   TSH 1.850 06/03/2020   Lab Results  Component Value Date   CHOL 114 10/25/2022   HDL 48 10/25/2022   LDLCALC 43 10/25/2022   TRIG 132 10/25/2022   CHOLHDL 2.4 10/25/2022   Lab Results  Component Value Date   VD25OH 76.0 09/13/2022   VD25OH 77.8 03/28/2022   VD25OH 54.3 11/23/2021   Lab Results  Component Value Date   WBC 8.1 10/02/2019   HGB 13.3 10/02/2019   HCT 39.1 10/02/2019   MCV 89 10/02/2019   PLT 306 10/02/2019   No results found for: "IRON", "TIBC", "FERRITIN"  Attestation Statements:   Reviewed by clinician on day of visit: allergies, medications, problem list, medical history, surgical history, family history, social history, and previous encounter notes.  I have reviewed the above documentation for accuracy and completeness, and I agree with the above. -  Rossetta Kama d. Suleyman Ehrman, NP-C

## 2023-02-15 DIAGNOSIS — F411 Generalized anxiety disorder: Secondary | ICD-10-CM | POA: Diagnosis not present

## 2023-02-22 DIAGNOSIS — J301 Allergic rhinitis due to pollen: Secondary | ICD-10-CM | POA: Diagnosis not present

## 2023-02-22 DIAGNOSIS — J3081 Allergic rhinitis due to animal (cat) (dog) hair and dander: Secondary | ICD-10-CM | POA: Diagnosis not present

## 2023-02-23 DIAGNOSIS — J3089 Other allergic rhinitis: Secondary | ICD-10-CM | POA: Diagnosis not present

## 2023-03-08 DIAGNOSIS — J301 Allergic rhinitis due to pollen: Secondary | ICD-10-CM | POA: Diagnosis not present

## 2023-03-08 DIAGNOSIS — J3089 Other allergic rhinitis: Secondary | ICD-10-CM | POA: Diagnosis not present

## 2023-03-08 DIAGNOSIS — J3081 Allergic rhinitis due to animal (cat) (dog) hair and dander: Secondary | ICD-10-CM | POA: Diagnosis not present

## 2023-03-12 ENCOUNTER — Telehealth: Payer: Self-pay

## 2023-03-12 NOTE — Telephone Encounter (Signed)
 Please schedule pt to see Dr. Marina Goodell to discuss Colonoscopy.

## 2023-03-13 DIAGNOSIS — G43719 Chronic migraine without aura, intractable, without status migrainosus: Secondary | ICD-10-CM | POA: Diagnosis not present

## 2023-03-13 DIAGNOSIS — M542 Cervicalgia: Secondary | ICD-10-CM | POA: Diagnosis not present

## 2023-03-13 NOTE — Telephone Encounter (Signed)
 LVM for patient to call back and schedule

## 2023-03-15 DIAGNOSIS — J301 Allergic rhinitis due to pollen: Secondary | ICD-10-CM | POA: Diagnosis not present

## 2023-03-15 DIAGNOSIS — J3081 Allergic rhinitis due to animal (cat) (dog) hair and dander: Secondary | ICD-10-CM | POA: Diagnosis not present

## 2023-03-15 DIAGNOSIS — J3089 Other allergic rhinitis: Secondary | ICD-10-CM | POA: Diagnosis not present

## 2023-03-20 ENCOUNTER — Encounter: Payer: Self-pay | Admitting: Internal Medicine

## 2023-03-21 DIAGNOSIS — J3089 Other allergic rhinitis: Secondary | ICD-10-CM | POA: Diagnosis not present

## 2023-03-21 DIAGNOSIS — J301 Allergic rhinitis due to pollen: Secondary | ICD-10-CM | POA: Diagnosis not present

## 2023-03-21 DIAGNOSIS — J3081 Allergic rhinitis due to animal (cat) (dog) hair and dander: Secondary | ICD-10-CM | POA: Diagnosis not present

## 2023-03-22 DIAGNOSIS — F411 Generalized anxiety disorder: Secondary | ICD-10-CM | POA: Diagnosis not present

## 2023-03-28 ENCOUNTER — Ambulatory Visit (INDEPENDENT_AMBULATORY_CARE_PROVIDER_SITE_OTHER): Payer: BC Managed Care – PPO | Admitting: Adult Health

## 2023-03-28 ENCOUNTER — Encounter (INDEPENDENT_AMBULATORY_CARE_PROVIDER_SITE_OTHER): Payer: Self-pay | Admitting: Adult Health

## 2023-03-28 VITALS — BP 128/85 | HR 110 | Temp 98.2°F | Ht 62.0 in | Wt 154.0 lb

## 2023-03-28 DIAGNOSIS — Z6830 Body mass index (BMI) 30.0-30.9, adult: Secondary | ICD-10-CM

## 2023-03-28 DIAGNOSIS — E781 Pure hyperglyceridemia: Secondary | ICD-10-CM

## 2023-03-28 DIAGNOSIS — E669 Obesity, unspecified: Secondary | ICD-10-CM

## 2023-03-28 DIAGNOSIS — Z0000 Encounter for general adult medical examination without abnormal findings: Secondary | ICD-10-CM

## 2023-03-28 DIAGNOSIS — R7303 Prediabetes: Secondary | ICD-10-CM

## 2023-03-28 DIAGNOSIS — R000 Tachycardia, unspecified: Secondary | ICD-10-CM

## 2023-03-28 DIAGNOSIS — J3089 Other allergic rhinitis: Secondary | ICD-10-CM | POA: Diagnosis not present

## 2023-03-28 DIAGNOSIS — J3081 Allergic rhinitis due to animal (cat) (dog) hair and dander: Secondary | ICD-10-CM | POA: Diagnosis not present

## 2023-03-28 DIAGNOSIS — Z6828 Body mass index (BMI) 28.0-28.9, adult: Secondary | ICD-10-CM

## 2023-03-28 DIAGNOSIS — J301 Allergic rhinitis due to pollen: Secondary | ICD-10-CM | POA: Diagnosis not present

## 2023-03-28 MED ORDER — VITAMIN B-12 1000 MCG PO TABS: 1000.0000 ug | ORAL_TABLET | Freq: Every day | ORAL | Status: AC

## 2023-03-28 NOTE — Progress Notes (Signed)
WEIGHT SUMMARY AND BIOMETRICS  Vitals Temp: 98.2 F (36.8 C) BP: 128/85 Pulse Rate: (!) 110 SpO2: 98 %   Anthropometric Measurements Height: 5\' 2"  (1.575 m) Weight: 154 lb (69.9 kg) BMI (Calculated): 28.16 Weight at Last Visit: 155lb Weight Lost Since Last Visit: 1lb Weight Gained Since Last Visit: 0 Starting Weight: 177lb Total Weight Loss (lbs): 23 lb (10.4 kg) Peak Weight: 180lb   Body Composition  Body Fat %: 23.6 % Fat Mass (lbs): 36.4 lbs Muscle Mass (lbs): 111.8 lbs Total Body Water (lbs): 63.4 lbs Visceral Fat Rating : 5   Other Clinical Data Fasting: no Labs: no Today's Visit #: 42 Starting Date: 10/02/19    Chief Complaint:   OBESITY [name] Park is here to discuss her progress with her obesity treatment plan. She is on the keeping a food journal and adhering to recommended goals of 1500-1700 calories and 85+ protein and states she is following her eating plan approximately 50-75 % of the time. She states she is exercising Walking 15-20 minutes 1-2 times per week.   Interim History:  She is still searching for PCP She plans on establishing by end of Winter 2025 She has initial OV with Gastroenterologist end of March 2025- needs first screening colonoscopy  2025 Health Goals 1) Increase water intake 2) Decrease caffeine intake  Reviewed Bioimpedance results with pt: Muscle Mass: +14.2 lbs Adipose Mass: -16.4 lbs Visceral Rating is currently 5  Subjective:   1. Healthcare maintenance She is still searching for PCP She plans on establishing by end of Winter 2025 She has initial OV with Gastroenterologist end of March 2025- needs first screening colonoscopy  2. Increased heart rate Discussed at length how caffeine and anxiety can increase HR. She denies palpitations or CP She denies dyspnea She reports drinking caffeine every morning during the work week: M-F She had at least 6 oz of coffee just prior to OV today HR this morning 110 with  stable BP (128/85)  3. Hypertriglyceridemia She is taking Vascepa 1mg : 2 tabs BID  4. Prediabetes Lab Results  Component Value Date   HGBA1C 5.6 10/25/2022   HGBA1C 5.8 (H) 03/28/2022   HGBA1C 5.5 11/23/2021    Endocrinology manages Metformin 500mg  2 tabs BID She denies GI upset Ozempic caused severe GI upset- "abdominal pain for 8 hrs/day"  Assessment/Plan:   1. Healthcare maintenance Establish with local PCP- she still has list of providers Establish with Gastroenterologist  2. Increased heart rate (Primary) Limit caffeine and increase plain water Monitor HR  3. Hypertriglyceridemia Continue Vascepa 1mg : 2 tabs BID  4. Prediabetes Continue Metformin per Endocrinologist  5. Obesity with current BMI 28.16  [name] Park is currently in the action stage of change. As such, her goal is to continue with weight loss efforts. She has agreed to keeping a food journal and adhering to recommended goals of 1500-1700 calories and 85+ protein.   Exercise goals: For substantial health benefits, adults should do at least 150 minutes (2 hours and 30 minutes) a week of moderate-intensity, or 75 minutes (1 hour and 15 minutes) a week of vigorous-intensity aerobic physical activity, or an equivalent combination of moderate- and vigorous-intensity aerobic activity. Aerobic activity should be performed in episodes of at least 10 minutes, and preferably, it should be spread throughout the week.  Behavioral modification strategies: increasing lean protein intake, decreasing simple carbohydrates, increasing vegetables, increasing water intake, meal planning and cooking strategies, keeping healthy foods in the home, ways to avoid boredom eating, ways  to avoid night time snacking, avoiding temptations, planning for success, and keeping a strict food journal.  [name] Park has agreed to follow-up with our clinic in 4 weeks. She was informed of the importance of frequent follow-up visits to maximize her success  with intensive lifestyle modifications for her multiple health conditions.   Check Fasting Labs and IC at next OV- pt aware to be 30 min early and to be fasting  Objective:   Blood pressure 128/85, pulse (!) 110, temperature 98.2 F (36.8 C), height 5\' 2"  (1.575 m), weight 154 lb (69.9 kg), SpO2 98%. Body mass index is 28.17 kg/m.  General: Cooperative, alert, well developed, in no acute distress. HEENT: Conjunctivae and lids unremarkable. Cardiovascular: Regular rhythm.  Lungs: Normal work of breathing. Neurologic: No focal deficits.   Lab Results  Component Value Date   CREATININE 0.83 10/25/2022   BUN 14 10/25/2022   NA 138 10/25/2022   K 5.1 10/25/2022   CL 101 10/25/2022   CO2 22 10/25/2022   Lab Results  Component Value Date   ALT 14 10/25/2022   AST 19 10/25/2022   ALKPHOS 59 10/25/2022   BILITOT <0.2 10/25/2022   Lab Results  Component Value Date   HGBA1C 5.6 10/25/2022   HGBA1C 5.8 (H) 03/28/2022   HGBA1C 5.5 11/23/2021   HGBA1C 5.5 07/28/2021   HGBA1C 5.4 03/28/2021   Lab Results  Component Value Date   INSULIN 25.7 (H) 09/13/2022   INSULIN 30.4 (H) 03/28/2022   INSULIN 62.3 (H) 11/23/2021   INSULIN 16.8 07/28/2021   INSULIN 21.6 03/28/2021   Lab Results  Component Value Date   TSH 1.850 06/03/2020   Lab Results  Component Value Date   CHOL 114 10/25/2022   HDL 48 10/25/2022   LDLCALC 43 10/25/2022   TRIG 132 10/25/2022   CHOLHDL 2.4 10/25/2022   Lab Results  Component Value Date   VD25OH 76.0 09/13/2022   VD25OH 77.8 03/28/2022   VD25OH 54.3 11/23/2021   Lab Results  Component Value Date   WBC 8.1 10/02/2019   HGB 13.3 10/02/2019   HCT 39.1 10/02/2019   MCV 89 10/02/2019   PLT 306 10/02/2019   No results found for: "IRON", "TIBC", "FERRITIN"  Attestation Statements:   Reviewed by clinician on day of visit: allergies, medications, problem list, medical history, surgical history, family history, social history, and previous  encounter notes.  I have reviewed the above documentation for accuracy and completeness, and I agree with the above. -  [name] Park d. Jami Ohlin, NP-C

## 2023-04-04 DIAGNOSIS — J301 Allergic rhinitis due to pollen: Secondary | ICD-10-CM | POA: Diagnosis not present

## 2023-04-04 DIAGNOSIS — J3081 Allergic rhinitis due to animal (cat) (dog) hair and dander: Secondary | ICD-10-CM | POA: Diagnosis not present

## 2023-04-04 DIAGNOSIS — J3089 Other allergic rhinitis: Secondary | ICD-10-CM | POA: Diagnosis not present

## 2023-04-05 DIAGNOSIS — F411 Generalized anxiety disorder: Secondary | ICD-10-CM | POA: Diagnosis not present

## 2023-04-17 DIAGNOSIS — J4520 Mild intermittent asthma, uncomplicated: Secondary | ICD-10-CM | POA: Diagnosis not present

## 2023-04-17 DIAGNOSIS — J3081 Allergic rhinitis due to animal (cat) (dog) hair and dander: Secondary | ICD-10-CM | POA: Diagnosis not present

## 2023-04-17 DIAGNOSIS — J3089 Other allergic rhinitis: Secondary | ICD-10-CM | POA: Diagnosis not present

## 2023-04-17 DIAGNOSIS — J301 Allergic rhinitis due to pollen: Secondary | ICD-10-CM | POA: Diagnosis not present

## 2023-04-20 ENCOUNTER — Other Ambulatory Visit (INDEPENDENT_AMBULATORY_CARE_PROVIDER_SITE_OTHER): Payer: Self-pay | Admitting: Adult Health

## 2023-04-20 DIAGNOSIS — E781 Pure hyperglyceridemia: Secondary | ICD-10-CM

## 2023-04-24 ENCOUNTER — Other Ambulatory Visit (INDEPENDENT_AMBULATORY_CARE_PROVIDER_SITE_OTHER): Payer: Self-pay | Admitting: Adult Health

## 2023-04-24 DIAGNOSIS — E781 Pure hyperglyceridemia: Secondary | ICD-10-CM

## 2023-04-26 ENCOUNTER — Ambulatory Visit (INDEPENDENT_AMBULATORY_CARE_PROVIDER_SITE_OTHER): Payer: BC Managed Care – PPO | Admitting: Adult Health

## 2023-04-26 DIAGNOSIS — F411 Generalized anxiety disorder: Secondary | ICD-10-CM | POA: Diagnosis not present

## 2023-04-30 ENCOUNTER — Other Ambulatory Visit: Payer: Self-pay

## 2023-04-30 ENCOUNTER — Telehealth (INDEPENDENT_AMBULATORY_CARE_PROVIDER_SITE_OTHER): Payer: Self-pay | Admitting: Adult Health

## 2023-04-30 DIAGNOSIS — E781 Pure hyperglyceridemia: Secondary | ICD-10-CM

## 2023-04-30 MED ORDER — VASCEPA 1 G PO CAPS: ORAL_CAPSULE | ORAL | 0 refills | Status: DC

## 2023-04-30 NOTE — Progress Notes (Unsigned)
 LAST APPOINTMENT DATE: 03/28/23 NEXT APPOINTMENT DATE: 05/24/23   [facility] - [place], [place] - [address] [place] [address] Phone: [phone] Fax: [phone]  Patient is requesting a refill of the following medications: Requested Prescriptions    No prescriptions requested or ordered in this encounter    Date last filled: 01/09/23 with 2 refills Previously prescribed by K. Danford  Lab Results  Component Value Date   HGBA1C 5.6 10/25/2022   HGBA1C 5.8 (H) 03/28/2022   HGBA1C 5.5 11/23/2021   Lab Results  Component Value Date   LDLCALC 43 10/25/2022   CREATININE 0.83 10/25/2022   Lab Results  Component Value Date   VD25OH 76.0 09/13/2022   VD25OH 77.8 03/28/2022   VD25OH 54.3 11/23/2021    BP Readings from Last 3 Encounters:  03/28/23 128/85  02/14/23 111/76  01/09/23 111/77

## 2023-04-30 NOTE — Telephone Encounter (Signed)
 Patient called in stating she needed a refill for Vascepa sent to her pharmacy. Patient reports she runs out of the medication tomorrow 05/01/2023 and she does not want to go without it. Please follow up with the patient.

## 2023-05-01 DIAGNOSIS — F1020 Alcohol dependence, uncomplicated: Secondary | ICD-10-CM | POA: Diagnosis not present

## 2023-05-01 DIAGNOSIS — F3189 Other bipolar disorder: Secondary | ICD-10-CM | POA: Diagnosis not present

## 2023-05-02 DIAGNOSIS — J3081 Allergic rhinitis due to animal (cat) (dog) hair and dander: Secondary | ICD-10-CM | POA: Diagnosis not present

## 2023-05-02 DIAGNOSIS — J3089 Other allergic rhinitis: Secondary | ICD-10-CM | POA: Diagnosis not present

## 2023-05-02 DIAGNOSIS — J301 Allergic rhinitis due to pollen: Secondary | ICD-10-CM | POA: Diagnosis not present

## 2023-05-03 DIAGNOSIS — G43719 Chronic migraine without aura, intractable, without status migrainosus: Secondary | ICD-10-CM | POA: Diagnosis not present

## 2023-05-03 DIAGNOSIS — M542 Cervicalgia: Secondary | ICD-10-CM | POA: Diagnosis not present

## 2023-05-10 DIAGNOSIS — F411 Generalized anxiety disorder: Secondary | ICD-10-CM | POA: Diagnosis not present

## 2023-05-24 ENCOUNTER — Encounter (INDEPENDENT_AMBULATORY_CARE_PROVIDER_SITE_OTHER): Payer: Self-pay | Admitting: Adult Health

## 2023-05-24 ENCOUNTER — Ambulatory Visit (INDEPENDENT_AMBULATORY_CARE_PROVIDER_SITE_OTHER): Payer: BC Managed Care – PPO | Admitting: Adult Health

## 2023-05-24 VITALS — BP 123/81 | HR 101 | Temp 97.6°F | Ht 62.0 in | Wt 156.0 lb

## 2023-05-24 DIAGNOSIS — E559 Vitamin D deficiency, unspecified: Secondary | ICD-10-CM | POA: Diagnosis not present

## 2023-05-24 DIAGNOSIS — Z6828 Body mass index (BMI) 28.0-28.9, adult: Secondary | ICD-10-CM

## 2023-05-24 DIAGNOSIS — R0602 Shortness of breath: Secondary | ICD-10-CM | POA: Diagnosis not present

## 2023-05-24 DIAGNOSIS — E781 Pure hyperglyceridemia: Secondary | ICD-10-CM

## 2023-05-24 DIAGNOSIS — E538 Deficiency of other specified B group vitamins: Secondary | ICD-10-CM

## 2023-05-24 DIAGNOSIS — E669 Obesity, unspecified: Secondary | ICD-10-CM

## 2023-05-24 DIAGNOSIS — Z6830 Body mass index (BMI) 30.0-30.9, adult: Secondary | ICD-10-CM

## 2023-05-24 DIAGNOSIS — R7303 Prediabetes: Secondary | ICD-10-CM | POA: Diagnosis not present

## 2023-05-24 DIAGNOSIS — E039 Hypothyroidism, unspecified: Secondary | ICD-10-CM

## 2023-05-24 DIAGNOSIS — F411 Generalized anxiety disorder: Secondary | ICD-10-CM | POA: Diagnosis not present

## 2023-05-24 MED ORDER — VASCEPA 1 G PO CAPS: ORAL_CAPSULE | ORAL | 0 refills | Status: DC

## 2023-05-24 NOTE — Progress Notes (Signed)
 WEIGHT SUMMARY AND BIOMETRICS  Vitals Temp: 97.6 F (36.4 C) BP: 123/81 Pulse Rate: (!) 101 SpO2: 99 %   Anthropometric Measurements Height: 5\' 2"  (1.575 m) Weight: 156 lb (70.8 kg) BMI (Calculated): 28.53 Weight at Last Visit: 154 lb Weight Lost Since Last Visit: 0 lb Weight Gained Since Last Visit: 2 lb Starting Weight: 177 lb Total Weight Loss (lbs): 21 lb (9.526 kg) Peak Weight: 180 lb   Body Composition  Body Fat %: 33.5 % Fat Mass (lbs): 52.4 lbs Muscle Mass (lbs): 99 lbs Total Body Water (lbs): 66 lbs Visceral Fat Rating : 7   Other Clinical Data RMR: 1656 Fasting: yes Labs: yes Today's Visit #: 78 Starting Date: 10/02/19    Chief Complaint:   OBESITY [name] Park is here to discuss her progress with her obesity treatment plan.  She is on the keeping a food journal and adhering to recommended goals of 1500-1700 calories and 85g+ protein and states she is following her eating plan approximately 60 % of the time.  She states she is exercising Walking Video 15-20 minutes 1-2 times per week.   Interim History:  She has been trying to reduce overall caffiene intake. She is drinking approximately 6-8 oz coffee in morning, reduced from 10 oz She will often have one energy drink either Celsius    05/24/23 08:00  RMR 1656  Metabolism faster than expected  Subjective:   1. Hypothyroidism, unspecified type She reports intermittent fatigue Endocrinology manages daily Levothyroxine 50 mcg  2. SOB (shortness of breath) on exertion She endorses dyspnea with extreme exertion, denies CP  05/24/23 08:00  RMR 1656  Metabolism faster than expected  3. Prediabetes Lab Results  Component Value Date   HGBA1C 5.6 10/25/2022   HGBA1C 5.8 (H) 03/28/2022   HGBA1C 5.5 11/23/2021    Endocrinology manages Metformin 500mg - 2 tabs at breakfast She was unable to tolerate GLP-1 therapy, re: significant GI upset  4. Hypertriglyceridemia She is Vascepa 1g - 2 caps  BID She denies tobacco/vape use  5. Vitamin B 12 deficiency She is daily OTC oral B12 1,000 mcg  6. Vitamin D deficiency She is OTC Vit D 1,000 international units  2 caps a day= 2,000 international units  She reports intermittent fatigue  Assessment/Plan:   1. Hypothyroidism, unspecified type Check Labs - TSH + free T4  2. SOB (shortness of breath) on exertion (Primary) Weight Loss 1250-1350 cal with 90-100g protein Maintain Weight 1650 cal with 100g+ protein  3. Prediabetes Check Labs - Hemoglobin A1c - Insulin, random - Magnesium  4. Hypertriglyceridemia Check Labs and Refill - Comprehensive metabolic panel - VASCEPA 1 g capsule; TAKE TWO CAPSULES TWICE DAILY  Dispense: 120 capsule; Refill: 0 - Lipid panel  5. Vitamin B 12 deficiency Check Labs - Vitamin B12  6. Vitamin D deficiency Check Labs - VITAMIN D 25 Hydroxy (Vit-D Deficiency, Fractures)  7. Obesity with current BMI 28.6  [name] Park is currently in the action stage of change. As such, her goal is to continue with weight loss efforts. She has agreed to keeping a food journal and adhering to recommended goals of 1250-1350 calories and 90-100g protein. - Weight Loss Maintain Weight 1650 cal with 100g+ protein  Exercise goals: For substantial health benefits, adults should do at least 150 minutes (2 hours and 30 minutes) a week of moderate-intensity, or 75 minutes (1 hour and 15 minutes) a week of vigorous-intensity aerobic physical activity, or an equivalent combination of moderate- and vigorous-intensity  aerobic activity. Aerobic activity should be performed in episodes of at least 10 minutes, and preferably, it should be spread throughout the week.  Behavioral modification strategies: increasing lean protein intake, decreasing simple carbohydrates, increasing vegetables, increasing water intake, meal planning and cooking strategies, keeping healthy foods in the home, ways to avoid boredom eating, and planning  for success.  [name] Park has agreed to follow-up with our clinic in 4 weeks. She was informed of the importance of frequent follow-up visits to maximize her success with intensive lifestyle modifications for her multiple health conditions.   [name] Park was informed we would discuss her lab results at her next visit unless there is a critical issue that needs to be addressed sooner. [name] Park agreed to keep her next visit at the agreed upon time to discuss these results.  Objective:   Blood pressure 123/81, pulse (!) 101, temperature 97.6 F (36.4 C), height 5\' 2"  (1.575 m), weight 156 lb (70.8 kg), SpO2 99%. Body mass index is 28.53 kg/m.  General: Cooperative, alert, well developed, in no acute distress. HEENT: Conjunctivae and lids unremarkable. Cardiovascular: Regular rhythm.  Lungs: Normal work of breathing. Neurologic: No focal deficits.   Lab Results  Component Value Date   CREATININE 0.83 10/25/2022   BUN 14 10/25/2022   NA 138 10/25/2022   K 5.1 10/25/2022   CL 101 10/25/2022   CO2 22 10/25/2022   Lab Results  Component Value Date   ALT 14 10/25/2022   AST 19 10/25/2022   ALKPHOS 59 10/25/2022   BILITOT <0.2 10/25/2022   Lab Results  Component Value Date   HGBA1C 5.6 10/25/2022   HGBA1C 5.8 (H) 03/28/2022   HGBA1C 5.5 11/23/2021   HGBA1C 5.5 07/28/2021   HGBA1C 5.4 03/28/2021   Lab Results  Component Value Date   INSULIN 25.7 (H) 09/13/2022   INSULIN 30.4 (H) 03/28/2022   INSULIN 62.3 (H) 11/23/2021   INSULIN 16.8 07/28/2021   INSULIN 21.6 03/28/2021   Lab Results  Component Value Date   TSH 1.850 06/03/2020   Lab Results  Component Value Date   CHOL 114 10/25/2022   HDL 48 10/25/2022   LDLCALC 43 10/25/2022   TRIG 132 10/25/2022   CHOLHDL 2.4 10/25/2022   Lab Results  Component Value Date   VD25OH 76.0 09/13/2022   VD25OH 77.8 03/28/2022   VD25OH 54.3 11/23/2021   Lab Results  Component Value Date   WBC 8.1 10/02/2019   HGB 13.3 10/02/2019   HCT  39.1 10/02/2019   MCV 89 10/02/2019   PLT 306 10/02/2019   No results found for: "IRON", "TIBC", "FERRITIN"  Attestation Statements:   Reviewed by clinician on day of visit: allergies, medications, problem list, medical history, surgical history, family history, social history, and previous encounter notes.  I have reviewed the above documentation for accuracy and completeness, and I agree with the above. -  Milana Salay d. Brynn Mulgrew, NP-C

## 2023-05-25 LAB — COMPREHENSIVE METABOLIC PANEL
ALT: 15 IU/L (ref 0–32)
AST: 18 IU/L (ref 0–40)
Albumin: 4.1 g/dL (ref 3.9–4.9)
Alkaline Phosphatase: 65 IU/L (ref 44–121)
BUN/Creatinine Ratio: 16 (ref 9–23)
BUN: 13 mg/dL (ref 6–24)
Bilirubin Total: <0.2 mg/dL (ref 0.0–1.2)
CO2: 20 mmol/L (ref 20–29)
Calcium: 9.4 mg/dL (ref 8.7–10.2)
Chloride: 102 mmol/L (ref 96–106)
Creatinine, Ser: 0.83 mg/dL (ref 0.57–1.00)
Globulin, Total: 2.1 g/dL (ref 1.5–4.5)
Glucose: 93 mg/dL (ref 70–99)
Potassium: 4.8 mmol/L (ref 3.5–5.2)
Sodium: 136 mmol/L (ref 134–144)
Total Protein: 6.2 g/dL (ref 6.0–8.5)
eGFR: 89 mL/min/{1.73_m2} (ref >59)

## 2023-05-25 LAB — LIPID PANEL
Chol/HDL Ratio: 2.3 ratio (ref 0.0–4.4)
Cholesterol, Total: 95 mg/dL — ABNORMAL LOW (ref 100–199)
HDL: 41 mg/dL (ref >39)
LDL Chol Calc (NIH): 31 mg/dL (ref 0–99)
Triglycerides: 135 mg/dL (ref 0–149)
VLDL Cholesterol Cal: 23 mg/dL (ref 5–40)

## 2023-05-25 LAB — VITAMIN B12: Vitamin B-12: 809 pg/mL (ref 232–1245)

## 2023-05-25 LAB — HEMOGLOBIN A1C
Est. average glucose Bld gHb Est-mCnc: 117 mg/dL
Hgb A1c MFr Bld: 5.7 % — ABNORMAL HIGH (ref 4.8–5.6)

## 2023-05-25 LAB — MAGNESIUM: Magnesium: 1.7 mg/dL (ref 1.6–2.3)

## 2023-05-25 LAB — VITAMIN D 25 HYDROXY (VIT D DEFICIENCY, FRACTURES): Vit D, 25-Hydroxy: 58.5 ng/mL (ref 30.0–100.0)

## 2023-05-25 LAB — INSULIN, RANDOM: INSULIN: 39.8 u[IU]/mL — ABNORMAL HIGH (ref 2.6–24.9)

## 2023-05-25 LAB — TSH+FREE T4
Free T4: 0.95 ng/dL (ref 0.82–1.77)
TSH: 1.27 u[IU]/mL (ref 0.450–4.500)

## 2023-05-29 ENCOUNTER — Other Ambulatory Visit (INDEPENDENT_AMBULATORY_CARE_PROVIDER_SITE_OTHER): Payer: Self-pay | Admitting: Adult Health

## 2023-05-29 DIAGNOSIS — E781 Pure hyperglyceridemia: Secondary | ICD-10-CM

## 2023-05-29 MED ORDER — VASCEPA 1 G PO CAPS: ORAL_CAPSULE | ORAL | 2 refills | Status: DC

## 2023-05-30 ENCOUNTER — Ambulatory Visit: Payer: Self-pay | Admitting: Internal Medicine

## 2023-05-30 ENCOUNTER — Encounter: Payer: Self-pay | Admitting: Internal Medicine

## 2023-05-30 VITALS — BP 120/70 | HR 116 | Ht 61.5 in | Wt 162.2 lb

## 2023-05-30 DIAGNOSIS — Z7984 Long term (current) use of oral hypoglycemic drugs: Secondary | ICD-10-CM

## 2023-05-30 DIAGNOSIS — J3089 Other allergic rhinitis: Secondary | ICD-10-CM | POA: Diagnosis not present

## 2023-05-30 DIAGNOSIS — E669 Obesity, unspecified: Secondary | ICD-10-CM

## 2023-05-30 DIAGNOSIS — J301 Allergic rhinitis due to pollen: Secondary | ICD-10-CM | POA: Diagnosis not present

## 2023-05-30 DIAGNOSIS — J45909 Unspecified asthma, uncomplicated: Secondary | ICD-10-CM

## 2023-05-30 DIAGNOSIS — Z1211 Encounter for screening for malignant neoplasm of colon: Secondary | ICD-10-CM

## 2023-05-30 DIAGNOSIS — E119 Type 2 diabetes mellitus without complications: Secondary | ICD-10-CM

## 2023-05-30 DIAGNOSIS — J3081 Allergic rhinitis due to animal (cat) (dog) hair and dander: Secondary | ICD-10-CM | POA: Diagnosis not present

## 2023-05-30 MED ORDER — SUTAB 1479-225-188 MG PO TABS
ORAL_TABLET | ORAL | 0 refills | Status: DC
Start: 2023-05-30 — End: 2023-08-31

## 2023-05-30 MED ORDER — SUTAB 1479-225-188 MG PO TABS: ORAL_TABLET | ORAL | 0 refills | Status: DC

## 2023-05-30 NOTE — Progress Notes (Signed)
 HISTORY OF PRESENT ILLNESS:  [name] Park is a 48 y.o. 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  She has no prior GI history.  The patient's father is a patient of mine and has a history of colon polyps.  There is no family history of colon cancer.  Patient herself has no relevant GI complaints.  Bowel habits are regular without bleeding.  Review of blood work from May 24, 2023 shows unremarkable comprehensive metabolic panel.  Hemoglobin A1c 5.7.  REVIEW OF SYSTEMS:  All non-GI ROS negative unless otherwise stated in the HPI except for sinus allergy trouble, anxiety, back pain, depression, fatigue, headaches, hearing problems, itching, skin rash  Past Medical History:  Diagnosis Date   ADHD (attention deficit hyperactivity disorder)    Anxiety    Back pain    Bipolar 1 disorder (HCC)    Carpal tunnel syndrome, bilateral    Depression    Environmental allergies    Hyperlipidemia    Hypertension    Hypothyroidism    Migraine    OCD (obsessive compulsive disorder)    Polycystic ovarian disease    Prediabetes    Sleep apnea     Past Surgical History:  Procedure Laterality Date   EYE SURGERY Bilateral    TONSILECTOMY, ADENOIDECTOMY, BILATERAL MYRINGOTOMY AND TUBES      Social History ODA PLACKE  reports that she has never smoked. She has never used smokeless tobacco. She reports current alcohol use of about 7.0 standard drinks of alcohol per week. She reports that she does not use drugs.  family history includes Allergies in her sister; Anxiety disorder in her mother; Bladder Cancer in her father; Depression in her father and mother; Heart attack in her paternal grandfather; Hyperlipidemia in her father; Hypertension in her father and mother; Obesity in her father; Sleep apnea in her father and mother; Stroke in her maternal grandfather; Thyroid disease in her mother.  Allergies   Allergen Reactions   Emgality [Galcanezumab-Gnlm]     Not effective.  10-30-17   Prednisone     Mood alterations   Latex Rash   Tuberculin Ppd Rash       PHYSICAL EXAMINATION: Vital signs: BP 120/70 (BP Location: Left Arm, Patient Position: Sitting, Cuff Size: Normal)   Pulse (!) 116   Ht 5' 1.5" (1.562 m) Comment: height measured without shoes  Wt 162 lb 4 oz (73.6 kg)   BMI 30.16 kg/m   Constitutional: generally well-appearing, no acute distress Psychiatric: alert and oriented x3, cooperative Eyes: extraocular movements intact, anicteric, conjunctiva pink Mouth: oral pharynx moist, no lesions Neck: supple no lymphadenopathy Cardiovascular: heart regular rate and rhythm, no murmur Lungs: clear to auscultation bilaterally Abdomen: soft, nontender, nondistended, no obvious ascites, no peritoneal signs, normal bowel sounds, no organomegaly Rectal: Deferred until colonoscopy Extremities: no clubbing, cyanosis, or lower extremity edema bilaterally Skin: no lesions on visible extremities Neuro: No focal deficits.  Cranial nerves intact  ASSESSMENT:  1.  Colon cancer screening.  Average risk for colorectal neoplasia.  No contraindication. 2.  Diabetes on metformin 3.  Reactive airway disease 4.  Obesity   PLAN:  1.  Screening colonoscopy.The nature of the procedure, as well as the risks, benefits, and alternatives were carefully and thoroughly reviewed with the patient. Ample time for discussion and questions allowed. The patient understood, was satisfied, and agreed to proceed. 2.  Hold metformin the day of the procedure 3.  Bring inhaler  with you on the day of your procedure 4.  Ongoing general medical care with primary provider

## 2023-05-30 NOTE — Patient Instructions (Signed)
 You have been scheduled for a colonoscopy. Please follow written instructions given to you at your visit today.   If you use inhalers (even only as needed), please bring them with you on the day of your procedure.  DO NOT TAKE 7 DAYS PRIOR TO TEST- Trulicity (dulaglutide) Ozempic, Wegovy (semaglutide) Mounjaro (tirzepatide) Bydureon Bcise (exanatide extended release)  DO NOT TAKE 1 DAY PRIOR TO YOUR TEST Rybelsus (semaglutide) Adlyxin (lixisenatide) Victoza (liraglutide) Byetta (exanatide) ___________________________________________________________________________  Bonita Quin will receive your bowel preparation through [facility], which ensures the lowest copay and home delivery, with outreach via text or call from an [phone] number. Please respond promptly to avoid rescheduling of your procedure. If you are interested in alternative options or have any questions regarding your prep, please contact them at [phone] ____________________________________________________________________________  Your Provider Has Sent Your Bowel Prep Regimen To [facility]   [facility] will contact you to verify your information and collect your copay, if applicable. Enjoy the comfort of your home while your prescription is mailed to you, FREE of any shipping charges.   [facility] accepts all major insurance benefits and applies discounts & coupons.  Have additional questions?   Chat: [email or web address] Call: [phone] Email: [email or web address] NCPDP: [id number]  How will [facility] contact you?  With a Welcome phone call,  a Welcome text and a checkout link in text form.  Texts you receive from [phone] Are NOT Spam.  *To set up delivery, you must complete the checkout process via link or speak to one of the patient care representatives. If [facility] is unable to reach you, your prescription may be delayed.  To avoid long hold times on the phone, you may also utilize the secure chat  feature on the [facility] website to request that they call you back for transaction completion or to expedite your concerns. _______________________________________________________  If your blood pressure at your visit was 140/90 or greater, please contact your primary care physician to follow up on this.  _______________________________________________________  If you are age 51 or older, your body mass index should be between 23-30. Your Body mass index is 30.16 kg/m. If this is out of the aforementioned range listed, please consider follow up with your Primary Care Provider.  If you are age 83 or younger, your body mass index should be between 19-25. Your Body mass index is 30.16 kg/m. If this is out of the aformentioned range listed, please consider follow up with your Primary Care Provider.   ________________________________________________________  The [facility] providers would like to encourage you to use [facility] to communicate with providers for non-urgent requests or questions.  Due to long hold times on the telephone, sending your provider a message by [facility] may be a faster and more efficient way to get a response.  Please allow 48 business hours for a response.  Please remember that this is for non-urgent requests.  _______________________________________________________

## 2023-06-07 DIAGNOSIS — F411 Generalized anxiety disorder: Secondary | ICD-10-CM | POA: Diagnosis not present

## 2023-06-13 DIAGNOSIS — M542 Cervicalgia: Secondary | ICD-10-CM | POA: Diagnosis not present

## 2023-06-13 DIAGNOSIS — G43719 Chronic migraine without aura, intractable, without status migrainosus: Secondary | ICD-10-CM | POA: Diagnosis not present

## 2023-06-17 ENCOUNTER — Encounter (INDEPENDENT_AMBULATORY_CARE_PROVIDER_SITE_OTHER): Payer: Self-pay | Admitting: Adult Health

## 2023-06-18 ENCOUNTER — Other Ambulatory Visit (INDEPENDENT_AMBULATORY_CARE_PROVIDER_SITE_OTHER): Payer: Self-pay | Admitting: Adult Health

## 2023-06-18 ENCOUNTER — Encounter (INDEPENDENT_AMBULATORY_CARE_PROVIDER_SITE_OTHER): Payer: Self-pay | Admitting: Adult Health

## 2023-06-18 DIAGNOSIS — E781 Pure hyperglyceridemia: Secondary | ICD-10-CM

## 2023-06-18 MED ORDER — VASCEPA 1 G PO CAPS: ORAL_CAPSULE | ORAL | 2 refills | Status: DC

## 2023-06-20 DIAGNOSIS — J3089 Other allergic rhinitis: Secondary | ICD-10-CM | POA: Diagnosis not present

## 2023-06-20 DIAGNOSIS — J3081 Allergic rhinitis due to animal (cat) (dog) hair and dander: Secondary | ICD-10-CM | POA: Diagnosis not present

## 2023-06-20 DIAGNOSIS — J301 Allergic rhinitis due to pollen: Secondary | ICD-10-CM | POA: Diagnosis not present

## 2023-06-21 DIAGNOSIS — F411 Generalized anxiety disorder: Secondary | ICD-10-CM | POA: Diagnosis not present

## 2023-07-05 DIAGNOSIS — F411 Generalized anxiety disorder: Secondary | ICD-10-CM | POA: Diagnosis not present

## 2023-07-10 ENCOUNTER — Encounter (INDEPENDENT_AMBULATORY_CARE_PROVIDER_SITE_OTHER): Payer: Self-pay | Admitting: Adult Health

## 2023-07-10 ENCOUNTER — Ambulatory Visit (INDEPENDENT_AMBULATORY_CARE_PROVIDER_SITE_OTHER): Admitting: Adult Health

## 2023-07-10 VITALS — BP 119/75 | HR 104 | Temp 97.6°F | Ht 62.0 in | Wt 156.0 lb

## 2023-07-10 DIAGNOSIS — R7303 Prediabetes: Secondary | ICD-10-CM

## 2023-07-10 DIAGNOSIS — E538 Deficiency of other specified B group vitamins: Secondary | ICD-10-CM

## 2023-07-10 DIAGNOSIS — E781 Pure hyperglyceridemia: Secondary | ICD-10-CM | POA: Diagnosis not present

## 2023-07-10 DIAGNOSIS — E66811 Obesity, class 1: Secondary | ICD-10-CM

## 2023-07-10 DIAGNOSIS — E669 Obesity, unspecified: Secondary | ICD-10-CM

## 2023-07-10 DIAGNOSIS — Z6828 Body mass index (BMI) 28.0-28.9, adult: Secondary | ICD-10-CM

## 2023-07-10 DIAGNOSIS — E559 Vitamin D deficiency, unspecified: Secondary | ICD-10-CM | POA: Diagnosis not present

## 2023-07-10 DIAGNOSIS — R1084 Generalized abdominal pain: Secondary | ICD-10-CM | POA: Diagnosis not present

## 2023-07-10 DIAGNOSIS — E039 Hypothyroidism, unspecified: Secondary | ICD-10-CM

## 2023-07-10 NOTE — Progress Notes (Signed)
 WEIGHT SUMMARY AND BIOMETRICS  Vitals Temp: 97.6 F (36.4 C) BP: 119/75 Pulse Rate: (!) 104 SpO2: 98 %   Anthropometric Measurements Height: 5\' 2"  (1.575 m) Weight: 156 lb (70.8 kg) BMI (Calculated): 28.53 Weight at Last Visit: 156 lb Weight Lost Since Last Visit: 0 Weight Gained Since Last Visit: 0 Starting Weight: 177 lb Total Weight Loss (lbs): 21 lb (9.526 kg) Peak Weight: 180 lb   Body Composition  Body Fat %: 34.1 % Fat Mass (lbs): 53.2 lbs Muscle Mass (lbs): 97.8 lbs Total Body Water (lbs): 65.2 lbs Visceral Fat Rating : 7   Other Clinical Data RMR: 1656 Fasting: no Labs: no Today's Visit #: 44 Starting Date: 10/02/19    Chief Complaint:   OBESITY [name] Park is here to discuss her progress with her obesity treatment plan.  She is on the keeping a food journal and adhering to recommended goals of 1500-1700 calories and 85+ protein and states she is following her eating plan approximately 50 % of the time.  She states she is exercising Home Walking Program 15-20 minutes 1-2 times per week.  Interim History:  [name] Park has her first screening colonoscopy scheduled 08/31/2023 with Dr. Donnice Gale She reports generalized intermittent abdominal pain the last several months. Pain will develop after breakfast and last 30-60 mins,then will self resolve. She rates the pain 5/10 and in unable to describe the pain. She is unsure what makes the pain better or worse. She did not mention this Dr. Elvin Hammer at her [facility].  Subjective:   1. Hypertriglyceridemia Discussed Labs Lipid Panel     Component Value Date/Time   CHOL 95 (L) 05/24/2023 0922   TRIG 135 05/24/2023 0922   HDL 41 05/24/2023 0922   CHOLHDL 2.3 05/24/2023 0922   LDLCALC 31 05/24/2023 0922   LABVLDL 23 05/24/2023 0922   The ASCVD Risk score (Arnett DK, et al., 2019) failed to calculate for the following reasons:   The valid total cholesterol range is 130 to 320 mg/dL  Dr. Venancio Gibney is  managing her Crestor 5mg  [name] Park is taking Mon/Tues/Wed- remains off the other days  2. Generalized abdominal pain [name] Park has her first screening colonoscopy scheduled 08/31/2023 with Dr. Donnice Gale She reports generalized intermittent abdominal pain the last several months. Pain will develop after breakfast and last 30-60 mins,then will self resolve. She rates the pain 5/10 and in unable to describe the pain. She is unsure what makes the pain better or worse. She did not mention this Dr. Elvin Hammer at her [facility].  3. Prediabetes Discussed Labs  Latest Reference Range & Units 05/24/23 09:22  Glucose 70 - 99 mg/dL 93  Hemoglobin W0J 4.8 - 5.6 % 5.7 (H)  Est. average glucose Bld gHb Est-mCnc mg/dL 811  INSULIN  2.6 - 24.9 uIU/mL 39.8 (H)  (H): Data is abnormally high  CBG at goal A1c and Insulin  levels both worsened and are above goal Endo manages Metformin 500mg  2 tabs  BID  4. Vitamin D  deficiency Discussed Labs  Latest Reference Range & Units 05/24/23 09:22  Vitamin D , 25-Hydroxy 30.0 - 100.0 ng/mL 58.5       Vit d Level at goal She is on daily OTC Vit D3 1000 international units   5. Vitamin B 12 deficiency Discussed Labs  Latest Reference Range & Units 05/24/23 09:22      Vitamin B12 232 - 1,245 pg/mL 809   Vit B Level at goal She is on oral B12 1000 mcg  daily  6. Hypothyroidism, unspecified type Discussed Labs  Latest Reference Range & Units 05/24/23 09:22  TSH 0.450 - 4.500 uIU/mL 1.270  T4,Free(Direct) 0.82 - 1.77 ng/dL 1.61   Thyroid  panel stable Endo manages her morning Levothyroxine 50 mcg  Assessment/Plan:   1. Hypertriglyceridemia Continue healthy eating and regular exercise Discuss Crestor dose at next OV with Dr. Venancio Gibney   2. Generalized abdominal pain Start a "sx log" of abd pain Discuss in detail with established GI/Dr. Elvin Hammer  3. Prediabetes (Primary) Continue healthy eating and regular exercise Continue Metformin per Endo  4.  Vitamin D  deficiency Continue OTC Supplementation  5. Vitamin B 12 deficiency Continue OTC Supplementation  6. Hypothyroidism, unspecified type Continue current Levothyroxine dose  7. Obesity with current BMI 28.6  [name] Park is currently in the action stage of change. As such, her goal is to continue with weight loss efforts. She has agreed to keeping a food journal and adhering to recommended goals of 1500-1700 calories and 85+ protein.   Exercise goals: For substantial health benefits, adults should do at least 150 minutes (2 hours and 30 minutes) a week of moderate-intensity, or 75 minutes (1 hour and 15 minutes) a week of vigorous-intensity aerobic physical activity, or an equivalent combination of moderate- and vigorous-intensity aerobic activity. Aerobic activity should be performed in episodes of at least 10 minutes, and preferably, it should be spread throughout the week.  Behavioral modification strategies: increasing lean protein intake, decreasing simple carbohydrates, increasing vegetables, increasing water intake, no skipping meals, meal planning and cooking strategies, keeping healthy foods in the home, and planning for success.  [name] Park has agreed to follow-up with our clinic in 4 weeks. She was informed of the importance of frequent follow-up visits to maximize her success with intensive lifestyle modifications for her multiple health conditions.   Objective:   Blood pressure 119/75, pulse (!) 104, temperature 97.6 F (36.4 C), height 5\' 2"  (1.575 m), weight 156 lb (70.8 kg), SpO2 98%. Body mass index is 28.53 kg/m.  General: Cooperative, alert, well developed, in no acute distress. HEENT: Conjunctivae and lids unremarkable. Cardiovascular: Regular rhythm.  Lungs: Normal work of breathing. Neurologic: No focal deficits.   Lab Results  Component Value Date   CREATININE 0.83 05/24/2023   BUN 13 05/24/2023   NA 136 05/24/2023   K 4.8 05/24/2023   CL 102 05/24/2023    CO2 20 05/24/2023   Lab Results  Component Value Date   ALT 15 05/24/2023   AST 18 05/24/2023   ALKPHOS 65 05/24/2023   BILITOT <0.2 05/24/2023   Lab Results  Component Value Date   HGBA1C 5.7 (H) 05/24/2023   HGBA1C 5.6 10/25/2022   HGBA1C 5.8 (H) 03/28/2022   HGBA1C 5.5 11/23/2021   HGBA1C 5.5 07/28/2021   Lab Results  Component Value Date   INSULIN  39.8 (H) 05/24/2023   INSULIN  25.7 (H) 09/13/2022   INSULIN  30.4 (H) 03/28/2022   INSULIN  62.3 (H) 11/23/2021   INSULIN  16.8 07/28/2021   Lab Results  Component Value Date   TSH 1.270 05/24/2023   Lab Results  Component Value Date   CHOL 95 (L) 05/24/2023   HDL 41 05/24/2023   LDLCALC 31 05/24/2023   TRIG 135 05/24/2023   CHOLHDL 2.3 05/24/2023   Lab Results  Component Value Date   VD25OH 58.5 05/24/2023   VD25OH 76.0 09/13/2022   VD25OH 77.8 03/28/2022   Lab Results  Component Value Date   WBC 8.1 10/02/2019   HGB 13.3 10/02/2019  HCT 39.1 10/02/2019   MCV 89 10/02/2019   PLT 306 10/02/2019   No results found for: "IRON", "TIBC", "FERRITIN"  Attestation Statements:   Reviewed by clinician on day of visit: allergies, medications, problem list, medical history, surgical history, family history, social history, and previous encounter notes.  I have reviewed the above documentation for accuracy and completeness, and I agree with the above. -  Georgeann Brinkman d. Jacqulene Huntley, NP-C

## 2023-07-11 DIAGNOSIS — J3081 Allergic rhinitis due to animal (cat) (dog) hair and dander: Secondary | ICD-10-CM | POA: Diagnosis not present

## 2023-07-11 DIAGNOSIS — J301 Allergic rhinitis due to pollen: Secondary | ICD-10-CM | POA: Diagnosis not present

## 2023-07-11 DIAGNOSIS — J3089 Other allergic rhinitis: Secondary | ICD-10-CM | POA: Diagnosis not present

## 2023-07-13 DIAGNOSIS — H52203 Unspecified astigmatism, bilateral: Secondary | ICD-10-CM | POA: Diagnosis not present

## 2023-07-13 DIAGNOSIS — H5010 Unspecified exotropia: Secondary | ICD-10-CM | POA: Diagnosis not present

## 2023-07-13 DIAGNOSIS — H5213 Myopia, bilateral: Secondary | ICD-10-CM | POA: Diagnosis not present

## 2023-07-19 DIAGNOSIS — F411 Generalized anxiety disorder: Secondary | ICD-10-CM | POA: Diagnosis not present

## 2023-07-25 DIAGNOSIS — M542 Cervicalgia: Secondary | ICD-10-CM | POA: Diagnosis not present

## 2023-07-25 DIAGNOSIS — G43719 Chronic migraine without aura, intractable, without status migrainosus: Secondary | ICD-10-CM | POA: Diagnosis not present

## 2023-08-01 DIAGNOSIS — J3089 Other allergic rhinitis: Secondary | ICD-10-CM | POA: Diagnosis not present

## 2023-08-01 DIAGNOSIS — J301 Allergic rhinitis due to pollen: Secondary | ICD-10-CM | POA: Diagnosis not present

## 2023-08-01 DIAGNOSIS — J3081 Allergic rhinitis due to animal (cat) (dog) hair and dander: Secondary | ICD-10-CM | POA: Diagnosis not present

## 2023-08-02 DIAGNOSIS — F411 Generalized anxiety disorder: Secondary | ICD-10-CM | POA: Diagnosis not present

## 2023-08-08 DIAGNOSIS — J3089 Other allergic rhinitis: Secondary | ICD-10-CM | POA: Diagnosis not present

## 2023-08-08 DIAGNOSIS — J301 Allergic rhinitis due to pollen: Secondary | ICD-10-CM | POA: Diagnosis not present

## 2023-08-08 DIAGNOSIS — J3081 Allergic rhinitis due to animal (cat) (dog) hair and dander: Secondary | ICD-10-CM | POA: Diagnosis not present

## 2023-08-14 DIAGNOSIS — J3081 Allergic rhinitis due to animal (cat) (dog) hair and dander: Secondary | ICD-10-CM | POA: Diagnosis not present

## 2023-08-14 DIAGNOSIS — J301 Allergic rhinitis due to pollen: Secondary | ICD-10-CM | POA: Diagnosis not present

## 2023-08-14 DIAGNOSIS — J3089 Other allergic rhinitis: Secondary | ICD-10-CM | POA: Diagnosis not present

## 2023-08-16 DIAGNOSIS — F411 Generalized anxiety disorder: Secondary | ICD-10-CM | POA: Diagnosis not present

## 2023-08-20 DIAGNOSIS — F411 Generalized anxiety disorder: Secondary | ICD-10-CM | POA: Diagnosis not present

## 2023-08-20 DIAGNOSIS — F429 Obsessive-compulsive disorder, unspecified: Secondary | ICD-10-CM | POA: Diagnosis not present

## 2023-08-20 DIAGNOSIS — Z5181 Encounter for therapeutic drug level monitoring: Secondary | ICD-10-CM | POA: Diagnosis not present

## 2023-08-20 DIAGNOSIS — F3181 Bipolar II disorder: Secondary | ICD-10-CM | POA: Diagnosis not present

## 2023-08-21 ENCOUNTER — Ambulatory Visit (INDEPENDENT_AMBULATORY_CARE_PROVIDER_SITE_OTHER): Payer: Self-pay | Admitting: Adult Health

## 2023-08-21 ENCOUNTER — Encounter (INDEPENDENT_AMBULATORY_CARE_PROVIDER_SITE_OTHER): Payer: Self-pay | Admitting: Adult Health

## 2023-08-21 ENCOUNTER — Telehealth (INDEPENDENT_AMBULATORY_CARE_PROVIDER_SITE_OTHER): Payer: Self-pay | Admitting: *Deleted

## 2023-08-21 VITALS — BP 114/76 | HR 111 | Temp 97.9°F | Ht 62.0 in | Wt 157.0 lb

## 2023-08-21 DIAGNOSIS — E66811 Obesity, class 1: Secondary | ICD-10-CM

## 2023-08-21 DIAGNOSIS — E781 Pure hyperglyceridemia: Secondary | ICD-10-CM

## 2023-08-21 DIAGNOSIS — Z6828 Body mass index (BMI) 28.0-28.9, adult: Secondary | ICD-10-CM

## 2023-08-21 DIAGNOSIS — E039 Hypothyroidism, unspecified: Secondary | ICD-10-CM | POA: Diagnosis not present

## 2023-08-21 DIAGNOSIS — E669 Obesity, unspecified: Secondary | ICD-10-CM

## 2023-08-21 DIAGNOSIS — E559 Vitamin D deficiency, unspecified: Secondary | ICD-10-CM

## 2023-08-21 DIAGNOSIS — R7303 Prediabetes: Secondary | ICD-10-CM | POA: Diagnosis not present

## 2023-08-21 MED ORDER — VASCEPA 1 G PO CAPS
ORAL_CAPSULE | ORAL | 2 refills | Status: AC
Start: 2023-08-21 — End: ?

## 2023-08-21 MED ORDER — ZEPBOUND 2.5 MG/0.5ML ~~LOC~~ SOAJ: 2.5000 mg | SUBCUTANEOUS | 0 refills | Status: DC

## 2023-08-21 NOTE — Telephone Encounter (Signed)
 Prior approval for the Zepbound 2.5 mg/0.5 ml has been sent to plan ,waiting for their response.

## 2023-08-21 NOTE — Progress Notes (Addendum)
 WEIGHT SUMMARY AND BIOMETRICS  Vitals Temp: 97.9 F (36.6 C) BP: 114/76 Pulse Rate: (!) 111 SpO2: 98 %   Anthropometric Measurements Height: 5' 2 (1.575 m) Weight: 157 lb (71.2 kg) BMI (Calculated): 28.71 Weight at Last Visit: 156 lb Weight Lost Since Last Visit: 0 Weight Gained Since Last Visit: 1 lb Starting Weight: 177 lb Total Weight Loss (lbs): 20 lb (9.072 kg) Peak Weight: 180 lb   Body Composition  Body Fat %: 48 % Fat Mass (lbs): 75.4 lbs Muscle Mass (lbs): 77.4 lbs Total Body Water (lbs): 61.8 lbs Visceral Fat Rating : 10   Other Clinical Data RMR: [id number] Fasting: no Labs: no Today's Visit #: 45 Starting Date: 10/02/19    Chief Complaint:   OBESITY [name] Park is here to discuss her progress with her obesity treatment plan.  She is on the keeping a food journal and adhering to recommended goals of 1500-1700 calories and 85g+ protein and states she is following her eating plan approximately 60 % of the time.  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 Hypertriglyceridemia Lipid Panel     Component Value Date/Time   CHOL 95 (L) 05/24/2023 0922   TRIG 135 05/24/2023 0922   HDL 41 05/24/2023 0922   CHOLHDL 2.3 05/24/2023 0922   LDLCALC 31 05/24/2023 0922   LABVLDL 23 05/24/2023 0922   The ASCVD Risk score (Arnett DK, et al., 2019) failed to calculate for the following reasons:   The valid total cholesterol range is 130 to 320 mg/dL   Endocrinology  manages Crestor 5mg   She takes Mon, [place], [place] She denies myalgias  2. Prediabetes  Latest Reference Range & Units 05/24/23 09:22  Glucose 70 - 99 mg/dL 93  Hemoglobin J8R 4.8 - 5.6 % 5.7 (H)  Est. average glucose Bld gHb Est-mCnc mg/dL 882  INSULIN  2.6 - 24.9 uIU/mL 39.8 (H)  (H): Data is abnormally high  Endocrinology manages Metformin 500mg  2 tabs at breakfast and 2 tabs at dinner She tried Ozempic  therapy for 12 weeks- between 0.25mg  to 0.5mg - unable to tolerated due to abdominal pain She denies family hx of MENS 2 or MTC She denies personal hx of pancreatitis She is abstinent and on oral birth control  Discussed risks/benefits of Zepbound  therapy- agreeable to try  3. Hypothyroidism, unspecified type  Latest Reference Range & Units 05/24/23 09:22  TSH 0.450 - 4.500 uIU/mL 1.270  T4,Free(Direct) 0.82 - 1.77 ng/dL 9.04   Endocrinology manages Levothryroxine 50mcg Daily Monday- Friday  2 tabs each morning on weekends  4. Vitamin D  deficiency  Latest Reference Range & Units 03/28/22 09:11 09/13/22 08:18 05/24/23 09:22  Vitamin D , 25-Hydroxy 30.0 - 100.0 ng/mL 77.8 76.0 58.5   She is on daily OTC Vit D3 1,000 international units   Assessment/Plan:   1. Hypertriglyceridemia (Primary) Recommend to discuss statin dose with Endo at chronic f/u Refill VASCEPA  1 g capsule TAKE TWO CAPSULES TWICE DAILY Dispense: 120 capsule, Refills: 2 ordered   2. Prediabetes  Start tirzepatide  (ZEPBOUND ) 2.5 MG/0.5ML Pen Inject 2.5 mg into the skin once a week. Dispense: 3 mL, Refills: 0 ordered   3. Hypothyroidism, unspecified type F/u with Endo as directed  4. Vitamin D  deficiency Check Labs Q3M  5. Obesity with current BMI 28. Start tirzepatide  (ZEPBOUND ) 2.5 MG/0.5ML Pen Inject 2.5 mg into the skin once a week. Dispense: 3 mL, Refills: 0 ordered   [name] is currently in the action stage of change. As such, her goal is to continue with weight loss efforts. She has agreed to keeping  a food journal and adhering to recommended goals of 1500-1700 calories and 85g+ protein.   Exercise goals: All adults should avoid inactivity. Some physical activity is better than none, and adults who participate in any amount of physical activity gain some health benefits. Adults should also include muscle-strengthening activities that involve all major muscle groups on 2 or more days a week.  Behavioral modification strategies: increasing lean protein intake, decreasing simple carbohydrates, increasing vegetables, increasing water intake, no skipping meals, meal planning and cooking strategies, keeping healthy foods in the home, ways to avoid boredom eating, and planning for success.  [name] has agreed to follow-up with our clinic in 4 weeks. She was informed of the importance of frequent follow-up visits to maximize her success with intensive lifestyle modifications for her multiple health conditions.   Objective:   Blood pressure 114/76, pulse (!) 111, temperature 97.9 F (36.6 C), height 5' 2 (1.575 m), weight 157 lb (71.2 kg), SpO2 98%. Body mass index is 28.72 kg/m.  General: Cooperative, alert, well developed, in no acute distress. HEENT: Conjunctivae and lids unremarkable. Cardiovascular: Regular rhythm.  Lungs: Normal work of breathing. Neurologic: No focal deficits.   Lab Results  Component Value Date   CREATININE 0.83 05/24/2023   BUN 13 05/24/2023   NA 136 05/24/2023   K 4.8 05/24/2023   CL 102 05/24/2023   CO2 20 05/24/2023   Lab Results  Component Value Date   ALT 15 05/24/2023   AST 18 05/24/2023   ALKPHOS 65 05/24/2023   BILITOT <0.2 05/24/2023   Lab Results  Component Value Date   HGBA1C 5.7 (H) 05/24/2023   HGBA1C 5.6 10/25/2022   HGBA1C 5.8 (H) 03/28/2022   HGBA1C 5.5 11/23/2021   HGBA1C 5.5 07/28/2021   Lab Results  Component Value Date   INSULIN  39.8 (H) 05/24/2023   INSULIN  25.7 (H) 09/13/2022   INSULIN  30.4 (H) 03/28/2022   INSULIN  62.3 (H)  11/23/2021   INSULIN  16.8 07/28/2021   Lab Results  Component Value Date   TSH 1.270 05/24/2023   Lab Results  Component Value Date   CHOL 95 (L) 05/24/2023   HDL 41 05/24/2023   LDLCALC 31 05/24/2023   TRIG 135 05/24/2023   CHOLHDL 2.3 05/24/2023   Lab Results  Component Value Date   VD25OH 58.5 05/24/2023   VD25OH 76.0 09/13/2022   VD25OH 77.8 03/28/2022   Lab Results  Component Value Date   WBC 8.1 10/02/2019   HGB 13.3 10/02/2019   HCT 39.1 10/02/2019   MCV 89 10/02/2019   PLT 306 10/02/2019   No results found for: IRON, TIBC, FERRITIN  Attestation Statements:   Reviewed by clinician on day of visit: allergies, medications, problem list, medical history, surgical history, family history, social history, and previous encounter notes.  I have reviewed the above documentation for accuracy and completeness, and I agree with the above. -  Terran Hollenkamp d. Maira Christon, NP-C

## 2023-08-22 NOTE — Telephone Encounter (Signed)
 Zepbound 2.5 mg/0.5ml has been denied for the following reason: service is not a covered benefit per benefit booklet or plan documents. Patient has been updated thru MyChart.

## 2023-08-23 ENCOUNTER — Encounter: Payer: Self-pay | Admitting: Internal Medicine

## 2023-08-31 ENCOUNTER — Encounter: Payer: Self-pay | Admitting: Internal Medicine

## 2023-08-31 ENCOUNTER — Ambulatory Visit: Admitting: Internal Medicine

## 2023-08-31 VITALS — BP 137/81 | HR 85 | Temp 99.0°F | Resp 19 | Ht 61.0 in | Wt 162.0 lb

## 2023-08-31 DIAGNOSIS — D123 Benign neoplasm of transverse colon: Secondary | ICD-10-CM

## 2023-08-31 DIAGNOSIS — Z1211 Encounter for screening for malignant neoplasm of colon: Secondary | ICD-10-CM

## 2023-08-31 MED ORDER — SODIUM CHLORIDE 0.9 % IV SOLN: 500.0000 mL | INTRAVENOUS | Status: DC

## 2023-08-31 NOTE — Progress Notes (Signed)
 Expand All Collapse All HISTORY OF PRESENT ILLNESS:   [name] Park is a 48 y.o. 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  She has no prior GI history.   The patient's father is a patient of mine and has a history of colon polyps.  There is no family history of colon cancer.  Patient herself has no relevant GI complaints.  Bowel habits are regular without bleeding.  Review of blood work from May 24, 2023 shows unremarkable comprehensive metabolic panel.  Hemoglobin A1c 5.7.   REVIEW OF SYSTEMS:   All non-GI ROS negative unless otherwise stated in the HPI except for sinus allergy trouble, anxiety, back pain, depression, fatigue, headaches, hearing problems, itching, skin rash       Past Medical History:  Diagnosis Date   ADHD (attention deficit hyperactivity disorder)     Anxiety     Back pain     Bipolar 1 disorder (HCC)     Carpal tunnel syndrome, bilateral     Depression     Environmental allergies     Hyperlipidemia     Hypertension     Hypothyroidism     Migraine     OCD (obsessive compulsive disorder)     Polycystic ovarian disease     Prediabetes     Sleep apnea                 Past Surgical History:  Procedure Laterality Date   EYE SURGERY Bilateral     TONSILECTOMY, ADENOIDECTOMY, BILATERAL MYRINGOTOMY AND TUBES              Social History [name] Park  reports that she has never smoked. She has never used smokeless tobacco. She reports current alcohol use of about 7.0 standard drinks of alcohol per week. She reports that she does not use drugs.   family history includes Allergies in her sister; Anxiety disorder in her mother; Bladder Cancer in her father; Depression in her father and mother; Heart attack in her paternal grandfather; Hyperlipidemia in her father; Hypertension in her father and mother; Obesity in her father; Sleep apnea in her father and mother; Stroke in  her maternal grandfather; Thyroid  disease in her mother.   Allergies       Allergies  Allergen Reactions   Emgality [Galcanezumab-Gnlm]        Not effective.  10-30-17   Prednisone        Mood alterations   Latex Rash   Tuberculin Ppd Rash            PHYSICAL EXAMINATION: Vital signs: BP 120/70 (BP Location: Left Arm, Patient Position: Sitting, Cuff Size: Normal)   Pulse (!) 116   Ht 5' 1.5 (1.562 m) Comment: height measured without shoes  Wt 162 lb 4 oz (73.6 kg)   BMI 30.16 kg/m   Constitutional: generally well-appearing, no acute distress Psychiatric: alert and oriented x3, cooperative Eyes: extraocular movements intact, anicteric, conjunctiva pink Mouth: oral pharynx moist, no lesions Neck: supple no lymphadenopathy Cardiovascular: heart regular rate and rhythm, no murmur Lungs: clear to auscultation bilaterally Abdomen: soft, nontender, nondistended, no obvious ascites, no peritoneal signs, normal bowel sounds, no organomegaly Rectal: Deferred until colonoscopy Extremities: no clubbing, cyanosis, or lower extremity edema bilaterally Skin: no lesions on visible extremities Neuro: No focal deficits.  Cranial nerves intact   ASSESSMENT:   1.  Colon cancer screening.  Average risk for colorectal neoplasia.  No contraindication.  2.  Diabetes on metformin 3.  Reactive airway disease 4.  Obesity     PLAN:   1.  Screening colonoscopy.The nature of the procedure, as well as the risks, benefits, and alternatives were carefully and thoroughly reviewed with the patient. Ample time for discussion and questions allowed. The patient understood, was satisfied, and agreed to proceed. 2.  Hold metformin the day of the procedure 3.  Bring inhaler with you on the day of your procedure 4.  Ongoing general medical care with primary provider       Recent complete H&P as above.  No interval change.  Now for colonoscopy

## 2023-08-31 NOTE — Progress Notes (Signed)
 Called to room to assist during endoscopic procedure.  Patient ID and intended procedure confirmed with present staff. Received instructions for my participation in the procedure from the performing physician.

## 2023-08-31 NOTE — Progress Notes (Signed)
 Report to PACU, RN, vss, BBS= Clear.

## 2023-08-31 NOTE — Op Note (Signed)
 [facility] Patient Name: [name] Park Procedure Date: 08/31/2023 12:37 PM MRN: [id number] Endoscopist: Norleen SAILOR. Abran , MD, [id number] Age: 47 Referring MD:  Date of Birth: 11-Oct-1975 Gender: Female Account #: [id number] Procedure:                Colonoscopy with cold snare polypectomy x 1 Indications:              Screening for colorectal malignant neoplasm Medicines:                Monitored Anesthesia Care Procedure:                Pre-Anesthesia Assessment:                           - Prior to the procedure, a History and Physical                            was performed, and patient medications and                            allergies were reviewed. The patient's tolerance of                            previous anesthesia was also reviewed. The risks                            and benefits of the procedure and the sedation                            options and risks were discussed with the patient.                            All questions were answered, and informed consent                            was obtained. Prior Anticoagulants: The patient has                            taken no anticoagulant or antiplatelet agents. ASA                            Grade Assessment: II - A patient with mild systemic                            disease. After reviewing the risks and benefits,                            the patient was deemed in satisfactory condition to                            undergo the procedure.                           After obtaining informed consent, the colonoscope  was passed under direct vision. Throughout the                            procedure, the patient's blood pressure, pulse, and                            oxygen saturations were monitored continuously. The                            CF [id number] #[id number] was introduced through the anus                            and advanced to the the cecum, identified by                             appendiceal orifice and ileocecal valve. The                            ileocecal valve, appendiceal orifice, and rectum                            were photographed. The quality of the bowel                            preparation was excellent. The colonoscopy was                            performed without difficulty. The patient tolerated                            the procedure well. The bowel preparation used was                            SUPREP via split dose instruction. Scope In: 1:56:34 PM Scope Out: 2:12:04 PM Scope Withdrawal Time: 0 hours 12 minutes 28 seconds  Total Procedure Duration: 0 hours 15 minutes 30 seconds  Findings:                 A 5 mm polyp was found in the transverse colon. The                            polyp was removed with a cold snare. Resection and                            retrieval were complete.                           The exam was otherwise without abnormality on                            direct and retroflexion views. Complications:            No immediate complications. Estimated blood loss:  None. Estimated Blood Loss:     Estimated blood loss: none. Impression:               - One 5 mm polyp in the transverse colon, removed                            with a cold snare. Resected and retrieved.                           - The examination was otherwise normal on direct                            and retroflexion views. Recommendation:           - Repeat colonoscopy in 7-10 years for surveillance.                           - Patient has a contact number available for                            emergencies. The signs and symptoms of potential                            delayed complications were discussed with the                            patient. Return to normal activities tomorrow.                            Written discharge instructions were provided to the                            patient.                            - Resume previous diet.                           - Continue present medications.                           - Await pathology results. Norleen SAILOR. Abran, MD 08/31/2023 2:18:03 PM This report has been signed electronically.

## 2023-08-31 NOTE — Patient Instructions (Signed)

## 2023-09-03 ENCOUNTER — Telehealth: Payer: Self-pay

## 2023-09-03 NOTE — Telephone Encounter (Signed)
Left HIPAA compliant voicemail. 

## 2023-09-05 DIAGNOSIS — R7301 Impaired fasting glucose: Secondary | ICD-10-CM | POA: Diagnosis not present

## 2023-09-05 DIAGNOSIS — E282 Polycystic ovarian syndrome: Secondary | ICD-10-CM | POA: Diagnosis not present

## 2023-09-05 DIAGNOSIS — E039 Hypothyroidism, unspecified: Secondary | ICD-10-CM | POA: Diagnosis not present

## 2023-09-05 DIAGNOSIS — E669 Obesity, unspecified: Secondary | ICD-10-CM | POA: Diagnosis not present

## 2023-09-06 DIAGNOSIS — J3081 Allergic rhinitis due to animal (cat) (dog) hair and dander: Secondary | ICD-10-CM | POA: Diagnosis not present

## 2023-09-06 DIAGNOSIS — J3089 Other allergic rhinitis: Secondary | ICD-10-CM | POA: Diagnosis not present

## 2023-09-06 DIAGNOSIS — J301 Allergic rhinitis due to pollen: Secondary | ICD-10-CM | POA: Diagnosis not present

## 2023-09-12 ENCOUNTER — Ambulatory Visit: Payer: Self-pay | Admitting: Internal Medicine

## 2023-09-12 LAB — SURGICAL PATHOLOGY

## 2023-09-13 DIAGNOSIS — M542 Cervicalgia: Secondary | ICD-10-CM | POA: Diagnosis not present

## 2023-09-13 DIAGNOSIS — G43719 Chronic migraine without aura, intractable, without status migrainosus: Secondary | ICD-10-CM | POA: Diagnosis not present

## 2023-09-18 DIAGNOSIS — F411 Generalized anxiety disorder: Secondary | ICD-10-CM | POA: Diagnosis not present

## 2023-09-18 DIAGNOSIS — F3181 Bipolar II disorder: Secondary | ICD-10-CM | POA: Diagnosis not present

## 2023-09-18 DIAGNOSIS — F429 Obsessive-compulsive disorder, unspecified: Secondary | ICD-10-CM | POA: Diagnosis not present

## 2023-09-27 DIAGNOSIS — F411 Generalized anxiety disorder: Secondary | ICD-10-CM | POA: Diagnosis not present

## 2023-10-02 DIAGNOSIS — J301 Allergic rhinitis due to pollen: Secondary | ICD-10-CM | POA: Diagnosis not present

## 2023-10-02 DIAGNOSIS — J3081 Allergic rhinitis due to animal (cat) (dog) hair and dander: Secondary | ICD-10-CM | POA: Diagnosis not present

## 2023-10-02 DIAGNOSIS — J3089 Other allergic rhinitis: Secondary | ICD-10-CM | POA: Diagnosis not present

## 2023-10-03 DIAGNOSIS — R7301 Impaired fasting glucose: Secondary | ICD-10-CM | POA: Diagnosis not present

## 2023-10-03 DIAGNOSIS — E039 Hypothyroidism, unspecified: Secondary | ICD-10-CM | POA: Diagnosis not present

## 2023-10-04 ENCOUNTER — Encounter (INDEPENDENT_AMBULATORY_CARE_PROVIDER_SITE_OTHER): Payer: Self-pay | Admitting: Adult Health

## 2023-10-04 ENCOUNTER — Ambulatory Visit (INDEPENDENT_AMBULATORY_CARE_PROVIDER_SITE_OTHER): Admitting: Adult Health

## 2023-10-04 VITALS — BP 120/74 | HR 109 | Temp 98.3°F | Ht 62.0 in | Wt 156.0 lb

## 2023-10-04 DIAGNOSIS — Z6828 Body mass index (BMI) 28.0-28.9, adult: Secondary | ICD-10-CM

## 2023-10-04 DIAGNOSIS — R7303 Prediabetes: Secondary | ICD-10-CM

## 2023-10-04 DIAGNOSIS — Z0000 Encounter for general adult medical examination without abnormal findings: Secondary | ICD-10-CM

## 2023-10-04 DIAGNOSIS — E669 Obesity, unspecified: Secondary | ICD-10-CM | POA: Diagnosis not present

## 2023-10-04 DIAGNOSIS — E559 Vitamin D deficiency, unspecified: Secondary | ICD-10-CM | POA: Diagnosis not present

## 2023-10-04 DIAGNOSIS — Z6830 Body mass index (BMI) 30.0-30.9, adult: Secondary | ICD-10-CM

## 2023-10-04 NOTE — Progress Notes (Signed)
 WEIGHT SUMMARY AND BIOMETRICS  Vitals Temp: 98.3 F (36.8 C) BP: 120/74 Pulse Rate: (!) 109 SpO2: 98 %   Anthropometric Measurements Height: 5' 2 (1.575 m) Weight: 156 lb (70.8 kg) BMI (Calculated): 28.53 Weight at Last Visit: 157 lb Weight Lost Since Last Visit: 1 lb Weight Gained Since Last Visit: 0 Starting Weight: 177 lb Total Weight Loss (lbs): 21 lb (9.526 kg) Peak Weight: 180 lb   Body Composition  Body Fat %: 35.1 % Fat Mass (lbs): 55 lbs Muscle Mass (lbs): 96.4 lbs Total Body Water (lbs): 64.6 lbs Visceral Fat Rating : 7   Other Clinical Data RMR: [id number] Fasting: no Labs: no Today's Visit #: 46 Starting Date: 10/02/19    Chief Complaint:   OBESITY [name] Park is here to discuss her progress with her obesity treatment plan.  She is on the keeping a food journal and adhering to recommended goals of 1500-1700 calories and 85g+ protein and states she is following her eating plan approximately 50-70 % of the time.  She states she is exercising YouTube Walking Video 15-20 (at least 1 mile) minutes 2 times per week.  Interim History:  She estimates her intake via MyFitnessPal She has been focusing more on total caloric intake versus grams of protein Pt produced her smart phone and we reviewed how to track and interpret date on the App  Exercise-Leslie Sansone Walking programs via YouTube  Hydration-she estimates to drink 20-30 oz plain water/day  Subjective:   1. Healthcare maintenance 6/27/20205 Screening Colonoscopy completed 1 polyp removed and recommend to repeat survellience colonoscopy in 7-10 years  09/05/2023 chronic f/u with Endo/Dr. Tommas Recent labs and current medications that Endo is managing. Dr. Tommas did NOT make any medication adjustments  2. Prediabetes Lab Results  Component Value Date   HGBA1C 5.7 (H) 05/24/2023   HGBA1C 5.6 10/25/2022   HGBA1C 5.8 (H) 03/28/2022    Endocrinology manages Metformin 500mg  2 tabs at breakfast  and 2 tabs at dinner She tried Ozempic  therapy for 12 weeks- between 0.25mg  to 0.5mg - unable to tolerated due to abdominal pain She denies family hx of MENS 2 or MTC She denies personal hx of pancreatitis She is abstinent and on oral birth control Started on Zepbound  therapy at last [facility] OV- insurance denied coverage  3. Vitamin D  deficiency  Latest Reference Range & Units 03/28/22 09:11 09/13/22 08:18 05/24/23 09:22  Vitamin D , 25-Hydroxy 30.0 - 100.0 ng/mL 77.8 76.0 58.5   She endorses increased fatigue the last several weeks  Assessment/Plan:   1. Healthcare maintenance (Primary) Check Labs late Summer 2025 Increase water intake to at least 60 oz/day Continue statin therapy per Endo  2. Prediabetes Check Labs late Summer 2025 Continue Metformin per Endo  3. Vitamin D  deficiency Check Labs late Summer 2025  4. Obesity with current BMI 28.6  [name] Park is currently in the action stage of change. As such, her goal is to continue with weight loss efforts. She has agreed to keeping a food journal and adhering to recommended goals of 1500-1700 calories and 85g+ protein.   Exercise goals: For substantial health benefits, adults should do at least 150 minutes (2 hours and 30 minutes) a week of moderate-intensity, or 75 minutes (1 hour and 15 minutes) a week of vigorous-intensity aerobic physical activity, or an equivalent combination of moderate- and vigorous-intensity aerobic activity. Aerobic activity should be performed in episodes of at least 10 minutes, and preferably, it should be spread throughout the week.  Behavioral  modification strategies: increasing lean protein intake, decreasing simple carbohydrates, increasing vegetables, increasing water intake, meal planning and cooking strategies, keeping healthy foods in the home, ways to avoid boredom eating, ways to avoid night time snacking, and planning for success.  [place] has agreed to follow-up with our clinic in 4 weeks. She was  informed of the importance of frequent follow-up visits to maximize her success with intensive lifestyle modifications for her multiple health conditions.   Objective:   Blood pressure 120/74, pulse (!) 109, temperature 98.3 F (36.8 C), height 5' 2 (1.575 m), weight 156 lb (70.8 kg), SpO2 98%. Body mass index is 28.53 kg/m.  General: Cooperative, alert, well developed, in no acute distress. HEENT: Conjunctivae and lids unremarkable. Cardiovascular: Regular rhythm.  Lungs: Normal work of breathing. Neurologic: No focal deficits.   Lab Results  Component Value Date   CREATININE 0.83 05/24/2023   BUN 13 05/24/2023   NA 136 05/24/2023   K 4.8 05/24/2023   CL 102 05/24/2023   CO2 20 05/24/2023   Lab Results  Component Value Date   ALT 15 05/24/2023   AST 18 05/24/2023   ALKPHOS 65 05/24/2023   BILITOT <0.2 05/24/2023   Lab Results  Component Value Date   HGBA1C 5.7 (H) 05/24/2023   HGBA1C 5.6 10/25/2022   HGBA1C 5.8 (H) 03/28/2022   HGBA1C 5.5 11/23/2021   HGBA1C 5.5 07/28/2021   Lab Results  Component Value Date   INSULIN  39.8 (H) 05/24/2023   INSULIN  25.7 (H) 09/13/2022   INSULIN  30.4 (H) 03/28/2022   INSULIN  62.3 (H) 11/23/2021   INSULIN  16.8 07/28/2021   Lab Results  Component Value Date   TSH 1.270 05/24/2023   Lab Results  Component Value Date   CHOL 95 (L) 05/24/2023   HDL 41 05/24/2023   LDLCALC 31 05/24/2023   TRIG 135 05/24/2023   CHOLHDL 2.3 05/24/2023   Lab Results  Component Value Date   VD25OH 58.5 05/24/2023   VD25OH 76.0 09/13/2022   VD25OH 77.8 03/28/2022   Lab Results  Component Value Date   WBC 8.1 10/02/2019   HGB 13.3 10/02/2019   HCT 39.1 10/02/2019   MCV 89 10/02/2019   PLT 306 10/02/2019   No results found for: IRON, TIBC, FERRITIN   Attestation Statements:   Reviewed by clinician on day of visit: allergies, medications, problem list, medical history, surgical history, family history, social history, and previous  encounter notes.  Time spent on visit including pre-visit chart review and post-visit care and charting was 28 minutes.   I have reviewed the above documentation for accuracy and completeness, and I agree with the above. -  Doniven Vanpatten d. Shaiden Aldous, NP-C

## 2023-10-11 DIAGNOSIS — F411 Generalized anxiety disorder: Secondary | ICD-10-CM | POA: Diagnosis not present

## 2023-10-25 DIAGNOSIS — F3181 Bipolar II disorder: Secondary | ICD-10-CM | POA: Diagnosis not present

## 2023-10-25 DIAGNOSIS — F411 Generalized anxiety disorder: Secondary | ICD-10-CM | POA: Diagnosis not present

## 2023-10-25 DIAGNOSIS — F429 Obsessive-compulsive disorder, unspecified: Secondary | ICD-10-CM | POA: Diagnosis not present

## 2023-10-30 DIAGNOSIS — J3089 Other allergic rhinitis: Secondary | ICD-10-CM | POA: Diagnosis not present

## 2023-10-30 DIAGNOSIS — J3081 Allergic rhinitis due to animal (cat) (dog) hair and dander: Secondary | ICD-10-CM | POA: Diagnosis not present

## 2023-10-30 DIAGNOSIS — J301 Allergic rhinitis due to pollen: Secondary | ICD-10-CM | POA: Diagnosis not present

## 2023-11-01 DIAGNOSIS — G43719 Chronic migraine without aura, intractable, without status migrainosus: Secondary | ICD-10-CM | POA: Diagnosis not present

## 2023-11-01 DIAGNOSIS — M542 Cervicalgia: Secondary | ICD-10-CM | POA: Diagnosis not present

## 2023-11-08 DIAGNOSIS — F411 Generalized anxiety disorder: Secondary | ICD-10-CM | POA: Diagnosis not present

## 2023-11-15 ENCOUNTER — Encounter (INDEPENDENT_AMBULATORY_CARE_PROVIDER_SITE_OTHER): Payer: Self-pay | Admitting: Adult Health

## 2023-11-15 ENCOUNTER — Ambulatory Visit (INDEPENDENT_AMBULATORY_CARE_PROVIDER_SITE_OTHER): Admitting: Adult Health

## 2023-11-15 VITALS — BP 116/74 | HR 89 | Temp 97.8°F | Ht 62.0 in | Wt 155.0 lb

## 2023-11-15 DIAGNOSIS — E559 Vitamin D deficiency, unspecified: Secondary | ICD-10-CM | POA: Diagnosis not present

## 2023-11-15 DIAGNOSIS — R7303 Prediabetes: Secondary | ICD-10-CM

## 2023-11-15 DIAGNOSIS — E781 Pure hyperglyceridemia: Secondary | ICD-10-CM | POA: Diagnosis not present

## 2023-11-15 DIAGNOSIS — E669 Obesity, unspecified: Secondary | ICD-10-CM

## 2023-11-15 DIAGNOSIS — Z6828 Body mass index (BMI) 28.0-28.9, adult: Secondary | ICD-10-CM

## 2023-11-15 DIAGNOSIS — F3181 Bipolar II disorder: Secondary | ICD-10-CM | POA: Diagnosis not present

## 2023-11-15 DIAGNOSIS — E66811 Obesity, class 1: Secondary | ICD-10-CM

## 2023-11-15 NOTE — Progress Notes (Unsigned)
 WEIGHT SUMMARY AND BIOMETRICS  Vitals Temp: 97.8 F (36.6 C) BP: 116/74 Pulse Rate: 89 SpO2: 99 %   Anthropometric Measurements Height: 5' 2 (1.575 m) Weight: 155 lb (70.3 kg) BMI (Calculated): 28.34 Weight at Last Visit: 156 lb Weight Lost Since Last Visit: 1 lb Weight Gained Since Last Visit: 0 Starting Weight: 177 lb Total Weight Loss (lbs): 22 lb (9.979 kg) Peak Weight: 180 lb   Body Composition  Body Fat %: 36.7 % Fat Mass (lbs): 57.2 lbs Muscle Mass (lbs): 93.6 lbs Total Body Water (lbs): 64.8 lbs Visceral Fat Rating : 7   Other Clinical Data Fasting: no Labs: no Today's Visit #: no Starting Date: 10/02/19    Chief Complaint:   OBESITY [name] Park is here to discuss her progress with her obesity treatment plan.  She is on the keeping a food journal and adhering to recommended goals of 1500-1700 calories and 85g+ protein and states she is following her eating plan approximately 50-65 % of the time.  She states she is exercising Walking 30 minutes 1-3 times per week.  Interim History:  She and her mother recently travelled to [place]- walked frequently.  She established with new PCP/Dr. Janey at [facility]  She endorses stress at work, however feels that it is manageable and she still enjoys this job!  Of note- GLP-1 intolerant, SE: abdominal pain  Subjective:   1. Prediabetes Lab Results  Component Value Date   HGBA1C 5.7 (H) 05/24/2023   HGBA1C 5.6 10/25/2022   HGBA1C 5.8 (H) 03/28/2022     Latest Reference Range & Units 03/28/22 09:11 09/13/22 08:18 05/24/23 09:22  INSULIN  2.6 - 24.9 uIU/mL 30.4 (H) 25.7 (H) 39.8 (H)  (H): Data is abnormally high  Endocrinology manages Metformin 500mg , 2 tabs BID She denies GI upset with Metformin use Intolerant to GLP-1 therapy, SE: abdominal pain  2. Vitamin D  deficiency  Latest Reference Range & Units 03/28/22 09:11 09/13/22 08:18 05/24/23 09:22  Vitamin D , 25-Hydroxy 30.0 - 100.0  ng/mL 77.8 76.0 58.5   She is taking OTC Vit D3 1000 international units   3. Hypertriglyceridemia The ASCVD Risk score (Arnett DK, et al., 2019) failed to calculate for the following reasons:   The valid total cholesterol range is 130 to 320 mg/dL  Endocrinology manages Crestor 5mg  HWW manages Vascepa  1g, 2 caps BID  4. Bipolar 2 disorder (HCC) She was dx's with depression in her early teens. She was dx'd with Bipolar II at age 62. She is closely followed by Psychiatrist- titrating medications, specifically decreasing Seroquel and increasing Abilify She is currently on: Abilify 10mg  daily Seroquel 300mg  - 2 tabs at bedtime (600mg ) Atarax 50mg  at bedtime PRN Atarax 10mg    Of note- her maternal uncle has Bipolar II   Assessment/Plan:   1. Prediabetes Continue healthy eating and regular exercise  2. Vitamin D  deficiency (Primary) Continue current OTC supplementation Check Labs at next OV  3. Hypertriglyceridemia Continue healthy eating and regular exercise  4. Bipolar 2 disorder (HCC) Continue close follow-up with mental health care team  5. Obesity with current BMI 28.5  [name] Park is currently in the action stage of change. As such, her goal is to continue with weight loss efforts. She has agreed to keeping a food journal and adhering to recommended goals of 1500-1700 calories and 85g+ protein.   Exercise goals: All adults should avoid inactivity. Some physical activity is better than none, and adults who participate in any amount of physical  activity gain some health benefits. Adults should also include muscle-strengthening activities that involve all major muscle groups on 2 or more days a week. Strength Training 2 x week  Behavioral modification strategies: increasing lean protein intake, decreasing simple carbohydrates, increasing vegetables, increasing water intake, meal planning and cooking strategies, keeping healthy foods in the home, ways to avoid boredom eating, ways  to avoid night time snacking, and planning for success.  [name] Park has agreed to follow-up with our clinic in 4 weeks. She was informed of the importance of frequent follow-up visits to maximize her success with intensive lifestyle modifications for her multiple health conditions.   Check Fasting Labs at next OV  Objective:   Blood pressure 116/74, pulse 89, temperature 97.8 F (36.6 C), height 5' 2 (1.575 m), weight 155 lb (70.3 kg), SpO2 99%. Body mass index is 28.35 kg/m.  General: Cooperative, alert, well developed, in no acute distress. HEENT: Conjunctivae and lids unremarkable. Cardiovascular: Regular rhythm.  Lungs: Normal work of breathing. Neurologic: No focal deficits.   Lab Results  Component Value Date   CREATININE 0.83 05/24/2023   BUN 13 05/24/2023   NA 136 05/24/2023   K 4.8 05/24/2023   CL 102 05/24/2023   CO2 20 05/24/2023   Lab Results  Component Value Date   ALT 15 05/24/2023   AST 18 05/24/2023   ALKPHOS 65 05/24/2023   BILITOT <0.2 05/24/2023   Lab Results  Component Value Date   HGBA1C 5.7 (H) 05/24/2023   HGBA1C 5.6 10/25/2022   HGBA1C 5.8 (H) 03/28/2022   HGBA1C 5.5 11/23/2021   HGBA1C 5.5 07/28/2021   Lab Results  Component Value Date   INSULIN  39.8 (H) 05/24/2023   INSULIN  25.7 (H) 09/13/2022   INSULIN  30.4 (H) 03/28/2022   INSULIN  62.3 (H) 11/23/2021   INSULIN  16.8 07/28/2021   Lab Results  Component Value Date   TSH 1.270 05/24/2023   Lab Results  Component Value Date   CHOL 95 (L) 05/24/2023   HDL 41 05/24/2023   LDLCALC 31 05/24/2023   TRIG 135 05/24/2023   CHOLHDL 2.3 05/24/2023   Lab Results  Component Value Date   VD25OH 58.5 05/24/2023   VD25OH 76.0 09/13/2022   VD25OH 77.8 03/28/2022   Lab Results  Component Value Date   WBC 8.1 10/02/2019   HGB 13.3 10/02/2019   HCT 39.1 10/02/2019   MCV 89 10/02/2019   PLT 306 10/02/2019   No results found for: IRON, TIBC, FERRITIN   Attestation Statements:    Reviewed by clinician on day of visit: allergies, medications, problem list, medical history, surgical history, family history, social history, and previous encounter notes.  Time spent on visit including pre-visit chart review and post-visit care and charting was 28 minutes.   I have reviewed the above documentation for accuracy and completeness, and I agree with the above. -  Lynnda Wiersma d. Karandeep Resende, NP-C

## 2023-11-16 DIAGNOSIS — J3089 Other allergic rhinitis: Secondary | ICD-10-CM | POA: Diagnosis not present

## 2023-11-16 DIAGNOSIS — J3081 Allergic rhinitis due to animal (cat) (dog) hair and dander: Secondary | ICD-10-CM | POA: Diagnosis not present

## 2023-11-16 DIAGNOSIS — J301 Allergic rhinitis due to pollen: Secondary | ICD-10-CM | POA: Diagnosis not present

## 2023-11-22 DIAGNOSIS — F411 Generalized anxiety disorder: Secondary | ICD-10-CM | POA: Diagnosis not present

## 2023-12-04 DIAGNOSIS — Z01419 Encounter for gynecological examination (general) (routine) without abnormal findings: Secondary | ICD-10-CM | POA: Diagnosis not present

## 2023-12-04 DIAGNOSIS — Z6829 Body mass index (BMI) 29.0-29.9, adult: Secondary | ICD-10-CM | POA: Diagnosis not present

## 2023-12-04 DIAGNOSIS — F411 Generalized anxiety disorder: Secondary | ICD-10-CM | POA: Diagnosis not present

## 2023-12-04 DIAGNOSIS — F900 Attention-deficit hyperactivity disorder, predominantly inattentive type: Secondary | ICD-10-CM | POA: Diagnosis not present

## 2023-12-04 DIAGNOSIS — Z124 Encounter for screening for malignant neoplasm of cervix: Secondary | ICD-10-CM | POA: Diagnosis not present

## 2023-12-04 DIAGNOSIS — F3181 Bipolar II disorder: Secondary | ICD-10-CM | POA: Diagnosis not present

## 2023-12-04 DIAGNOSIS — Z1231 Encounter for screening mammogram for malignant neoplasm of breast: Secondary | ICD-10-CM | POA: Diagnosis not present

## 2023-12-04 DIAGNOSIS — F429 Obsessive-compulsive disorder, unspecified: Secondary | ICD-10-CM | POA: Diagnosis not present

## 2023-12-11 DIAGNOSIS — J3081 Allergic rhinitis due to animal (cat) (dog) hair and dander: Secondary | ICD-10-CM | POA: Diagnosis not present

## 2023-12-11 DIAGNOSIS — J301 Allergic rhinitis due to pollen: Secondary | ICD-10-CM | POA: Diagnosis not present

## 2023-12-11 DIAGNOSIS — J3089 Other allergic rhinitis: Secondary | ICD-10-CM | POA: Diagnosis not present

## 2023-12-13 DIAGNOSIS — M542 Cervicalgia: Secondary | ICD-10-CM | POA: Diagnosis not present

## 2023-12-13 DIAGNOSIS — H903 Sensorineural hearing loss, bilateral: Secondary | ICD-10-CM | POA: Diagnosis not present

## 2023-12-13 DIAGNOSIS — G43719 Chronic migraine without aura, intractable, without status migrainosus: Secondary | ICD-10-CM | POA: Diagnosis not present

## 2023-12-20 ENCOUNTER — Ambulatory Visit (INDEPENDENT_AMBULATORY_CARE_PROVIDER_SITE_OTHER): Admitting: Adult Health

## 2023-12-20 ENCOUNTER — Encounter (INDEPENDENT_AMBULATORY_CARE_PROVIDER_SITE_OTHER): Payer: Self-pay | Admitting: Adult Health

## 2023-12-20 VITALS — BP 113/71 | HR 95 | Temp 98.2°F | Ht 62.0 in | Wt 153.0 lb

## 2023-12-20 DIAGNOSIS — E669 Obesity, unspecified: Secondary | ICD-10-CM

## 2023-12-20 DIAGNOSIS — F988 Other specified behavioral and emotional disorders with onset usually occurring in childhood and adolescence: Secondary | ICD-10-CM

## 2023-12-20 DIAGNOSIS — E781 Pure hyperglyceridemia: Secondary | ICD-10-CM

## 2023-12-20 DIAGNOSIS — R7303 Prediabetes: Secondary | ICD-10-CM | POA: Diagnosis not present

## 2023-12-20 DIAGNOSIS — E559 Vitamin D deficiency, unspecified: Secondary | ICD-10-CM | POA: Diagnosis not present

## 2023-12-20 DIAGNOSIS — E66811 Obesity, class 1: Secondary | ICD-10-CM

## 2023-12-20 DIAGNOSIS — Z6828 Body mass index (BMI) 28.0-28.9, adult: Secondary | ICD-10-CM

## 2023-12-20 NOTE — Progress Notes (Signed)
 WEIGHT SUMMARY AND BIOMETRICS  Vitals Temp: 98.2 F (36.8 C) BP: 113/71 Pulse Rate: 95 SpO2: 99 %   Anthropometric Measurements Height: 5' 2 (1.575 m) Weight: 153 lb (69.4 kg) BMI (Calculated): 27.98 Weight at Last Visit: 155 lb Weight Lost Since Last Visit: 2 lb Weight Gained Since Last Visit: 0 Starting Weight: 177 lb Total Weight Loss (lbs): 24 lb (10.9 kg) Peak Weight: 180 lb   Body Composition  Body Fat %: 29.3 % Fat Mass (lbs): 45 lbs Muscle Mass (lbs): 103.2 lbs Total Body Water (lbs): 62.4 lbs Visceral Fat Rating : 6   Other Clinical Data Fasting: yes Labs: yes Today's Visit #: 48 Starting Date: 10/02/19    Chief Complaint:   OBESITY [name] Park is here to discuss her progress with her obesity treatment plan.  She is on the keeping a food journal and adhering to recommended goals of 1500-1700 calories and 85g+ protein and states she is following her eating plan approximately 30 % of the time.  She states she is exercising Walking  60 minutes 5 times per week.  Interim History:  She reports reduced stress and anxiety at work, as she was finally paid. Her company is funded via [employer] and she went unpaid go 5-6 weeks. She has received back pay and was able to meet all of her financial responsibilities.  Fasting labs will be completed today- discussed at length that pt will be contacted with any critical values tomorrow. Will reviewed all results in detail at next OV at [facility]  Subjective:   1. Hypertriglyceridemia  Latest Reference Range & Units 03/28/22 09:11 10/25/22 09:02 05/24/23 09:22  Triglycerides 0 - 149 mg/dL [phone]   HWW manages Vascepa  1g 2 caps BID Endocrinolgy manages Crestor 5mg - reduced from daily to 3 times per week due to sig decreased LDL level- reduction was a few years ago???  2. Prediabetes  Latest Reference Range & Units 05/24/23 09:22  Glucose 70 - 99 mg/dL 93  Hemoglobin J8R 4.8 - 5.6 % 5.7 (H)  Est.  average glucose Bld gHb Est-mCnc mg/dL 882  INSULIN  2.6 - 24.9 uIU/mL 39.8 (H)  (H): Data is abnormally high  Endocrinology manages Metformin 500mg , 2 tabs BID She denies GI upset with Metformin use Intolerant to GLP-1 therapy, SE: abdominal pain She is on daily oral B12 1000 mcg  3. Vitamin D  deficiency  Latest Reference Range & Units 03/28/22 09:11 09/13/22 08:18 05/24/23 09:22  Vitamin D , 25-Hydroxy 30.0 - 100.0 ng/mL 77.8 76.0 58.5   She reports taking daily OTC VIt D 3 1000  4. Attention deficit disorder, unspecified type [name] Park at [facility] manages her psychiatrist medications. She is reducing evening Serquel XR- currently taking 300mg   She was restarted her on low dose Adderall 10mg  end off Sept 2025 PDMP Reviewed- no aberrancies notes  Assessment/Plan:   1. Hypertriglyceridemia (Primary) Check Labs - Comprehensive metabolic panel with GFR; Future - Lipid panel  2. Prediabetes Check Labs - Magnesium - Hemoglobin A1c - Insulin , random  3. Vitamin D  deficiency Check Labs - VITAMIN D  25 Hydroxy (Vit-D Deficiency, Fractures)  4. Attention deficit disorder, unspecified type Continue regular exercise F/u with mental health care team as directed  5. Obesity with current BMI 28.1  [name] Park is not currently in the action stage of change. As such, her goal is to get back to weightloss efforts . She has agreed to keeping a food journal and adhering to recommended  goals of 1500-1700 calories and 85g+ protein.   Exercise goals: For substantial health benefits, adults should do at least 150 minutes (2 hours and 30 minutes) a week of moderate-intensity, or 75 minutes (1 hour and 15 minutes) a week of vigorous-intensity aerobic physical activity, or an equivalent combination of moderate- and vigorous-intensity aerobic activity. Aerobic activity should be performed in episodes of at least 10 minutes, and preferably, it should be spread  throughout the week.  Behavioral modification strategies: increasing lean protein intake, decreasing simple carbohydrates, increasing vegetables, increasing water intake, decreasing eating out, no skipping meals, meal planning and cooking strategies, keeping healthy foods in the home, ways to avoid boredom eating, and planning for success.  [name] Park has agreed to follow-up with our clinic in 4 weeks. She was informed of the importance of frequent follow-up visits to maximize her success with intensive lifestyle modifications for her multiple health conditions.   [name] Park was informed we would discuss her lab results at her next visit unless there is a critical issue that needs to be addressed sooner. [name] Park agreed to keep her next visit at the agreed upon time to discuss these results.  Objective:   Blood pressure 113/71, pulse 95, temperature 98.2 F (36.8 C), height 5' 2 (1.575 m), weight 153 lb (69.4 kg), SpO2 99%. Body mass index is 27.98 kg/m.  General: Cooperative, alert, well developed, in no acute distress. HEENT: Conjunctivae and lids unremarkable. Cardiovascular: Regular rhythm.  Lungs: Normal work of breathing. Neurologic: No focal deficits.   Lab Results  Component Value Date   CREATININE 0.83 05/24/2023   BUN 13 05/24/2023   NA 136 05/24/2023   K 4.8 05/24/2023   CL 102 05/24/2023   CO2 20 05/24/2023   Lab Results  Component Value Date   ALT 15 05/24/2023   AST 18 05/24/2023   ALKPHOS 65 05/24/2023   BILITOT <0.2 05/24/2023   Lab Results  Component Value Date   HGBA1C 5.7 (H) 05/24/2023   HGBA1C 5.6 10/25/2022   HGBA1C 5.8 (H) 03/28/2022   HGBA1C 5.5 11/23/2021   HGBA1C 5.5 07/28/2021   Lab Results  Component Value Date   INSULIN  39.8 (H) 05/24/2023   INSULIN  25.7 (H) 09/13/2022   INSULIN  30.4 (H) 03/28/2022   INSULIN  62.3 (H) 11/23/2021   INSULIN  16.8 07/28/2021   Lab Results  Component Value Date   TSH 1.270 05/24/2023   Lab Results  Component  Value Date   CHOL 95 (L) 05/24/2023   HDL 41 05/24/2023   LDLCALC 31 05/24/2023   TRIG 135 05/24/2023   CHOLHDL 2.3 05/24/2023   Lab Results  Component Value Date   VD25OH 58.5 05/24/2023   VD25OH 76.0 09/13/2022   VD25OH 77.8 03/28/2022   Lab Results  Component Value Date   WBC 8.1 10/02/2019   HGB 13.3 10/02/2019   HCT 39.1 10/02/2019   MCV 89 10/02/2019   PLT 306 10/02/2019   No results found for: IRON, TIBC, FERRITIN  Attestation Statements:   Reviewed by clinician on day of visit: allergies, medications, problem list, medical history, surgical history, family history, social history, and previous encounter notes.  I have reviewed the above documentation for accuracy and completeness, and I agree with the above. GLENWOOD Pee d Windle Huebert, NP-C

## 2023-12-21 LAB — LIPID PANEL
Chol/HDL Ratio: 2.6 ratio (ref 0.0–4.4)
Cholesterol, Total: 99 mg/dL — ABNORMAL LOW (ref 100–199)
HDL: 38 mg/dL — ABNORMAL LOW (ref >39)
LDL Chol Calc (NIH): 35 mg/dL (ref 0–99)
Triglycerides: 155 mg/dL — ABNORMAL HIGH (ref 0–149)
VLDL Cholesterol Cal: 26 mg/dL (ref 5–40)

## 2023-12-21 LAB — VITAMIN D 25 HYDROXY (VIT D DEFICIENCY, FRACTURES): Vit D, 25-Hydroxy: 61.7 ng/mL (ref 30.0–100.0)

## 2023-12-21 LAB — MAGNESIUM: Magnesium: 1.5 mg/dL — ABNORMAL LOW (ref 1.6–2.3)

## 2023-12-21 LAB — HEMOGLOBIN A1C
Est. average glucose Bld gHb Est-mCnc: 111 mg/dL
Hgb A1c MFr Bld: 5.5 % (ref 4.8–5.6)

## 2023-12-21 LAB — INSULIN, RANDOM: INSULIN: 38.6 u[IU]/mL — ABNORMAL HIGH (ref 2.6–24.9)

## 2024-01-08 DIAGNOSIS — J3089 Other allergic rhinitis: Secondary | ICD-10-CM | POA: Diagnosis not present

## 2024-01-08 DIAGNOSIS — J3081 Allergic rhinitis due to animal (cat) (dog) hair and dander: Secondary | ICD-10-CM | POA: Diagnosis not present

## 2024-01-08 DIAGNOSIS — J301 Allergic rhinitis due to pollen: Secondary | ICD-10-CM | POA: Diagnosis not present

## 2024-01-14 ENCOUNTER — Telehealth (INDEPENDENT_AMBULATORY_CARE_PROVIDER_SITE_OTHER): Payer: Self-pay | Admitting: *Deleted

## 2024-01-14 NOTE — Telephone Encounter (Signed)
 Prior authorization has been submitted  for

## 2024-01-15 ENCOUNTER — Encounter (INDEPENDENT_AMBULATORY_CARE_PROVIDER_SITE_OTHER): Payer: Self-pay

## 2024-01-15 ENCOUNTER — Telehealth (INDEPENDENT_AMBULATORY_CARE_PROVIDER_SITE_OTHER): Payer: Self-pay

## 2024-01-15 DIAGNOSIS — J4520 Mild intermittent asthma, uncomplicated: Secondary | ICD-10-CM | POA: Insufficient documentation

## 2024-01-15 DIAGNOSIS — E282 Polycystic ovarian syndrome: Secondary | ICD-10-CM | POA: Insufficient documentation

## 2024-01-15 DIAGNOSIS — F900 Attention-deficit hyperactivity disorder, predominantly inattentive type: Secondary | ICD-10-CM | POA: Insufficient documentation

## 2024-01-15 DIAGNOSIS — F319 Bipolar disorder, unspecified: Secondary | ICD-10-CM | POA: Insufficient documentation

## 2024-01-15 DIAGNOSIS — E785 Hyperlipidemia, unspecified: Secondary | ICD-10-CM | POA: Insufficient documentation

## 2024-01-15 DIAGNOSIS — B999 Unspecified infectious disease: Secondary | ICD-10-CM | POA: Insufficient documentation

## 2024-01-15 DIAGNOSIS — B009 Herpesviral infection, unspecified: Secondary | ICD-10-CM | POA: Insufficient documentation

## 2024-01-15 DIAGNOSIS — F419 Anxiety disorder, unspecified: Secondary | ICD-10-CM | POA: Insufficient documentation

## 2024-01-15 DIAGNOSIS — R7301 Impaired fasting glucose: Secondary | ICD-10-CM | POA: Insufficient documentation

## 2024-01-15 DIAGNOSIS — E669 Obesity, unspecified: Secondary | ICD-10-CM | POA: Insufficient documentation

## 2024-01-15 NOTE — Telephone Encounter (Signed)
Sent to Plan today. 

## 2024-01-16 DIAGNOSIS — J3081 Allergic rhinitis due to animal (cat) (dog) hair and dander: Secondary | ICD-10-CM | POA: Diagnosis not present

## 2024-01-16 DIAGNOSIS — J301 Allergic rhinitis due to pollen: Secondary | ICD-10-CM | POA: Diagnosis not present

## 2024-01-16 NOTE — Telephone Encounter (Signed)
 [name] Park (Key: [id number]) PA Case ID #: [id number]  Outcome Approved on November 11 by [facility] 2017  Approved. Effective Date: 01/15/2024 Authorization Expiration Date: 01/14/2027

## 2024-01-17 DIAGNOSIS — J3089 Other allergic rhinitis: Secondary | ICD-10-CM | POA: Diagnosis not present

## 2024-01-22 DIAGNOSIS — L821 Other seborrheic keratosis: Secondary | ICD-10-CM | POA: Diagnosis not present

## 2024-01-22 DIAGNOSIS — L719 Rosacea, unspecified: Secondary | ICD-10-CM | POA: Diagnosis not present

## 2024-01-22 DIAGNOSIS — L814 Other melanin hyperpigmentation: Secondary | ICD-10-CM | POA: Diagnosis not present

## 2024-01-22 DIAGNOSIS — D1801 Hemangioma of skin and subcutaneous tissue: Secondary | ICD-10-CM | POA: Diagnosis not present

## 2024-01-24 ENCOUNTER — Encounter (INDEPENDENT_AMBULATORY_CARE_PROVIDER_SITE_OTHER): Payer: Self-pay | Admitting: Adult Health

## 2024-01-24 ENCOUNTER — Ambulatory Visit (INDEPENDENT_AMBULATORY_CARE_PROVIDER_SITE_OTHER): Payer: Self-pay | Admitting: Adult Health

## 2024-01-24 VITALS — BP 116/79 | HR 105 | Temp 98.2°F | Ht 62.0 in | Wt 157.0 lb

## 2024-01-24 DIAGNOSIS — E559 Vitamin D deficiency, unspecified: Secondary | ICD-10-CM | POA: Diagnosis not present

## 2024-01-24 DIAGNOSIS — E66811 Obesity, class 1: Secondary | ICD-10-CM

## 2024-01-24 DIAGNOSIS — E781 Pure hyperglyceridemia: Secondary | ICD-10-CM

## 2024-01-24 DIAGNOSIS — E669 Obesity, unspecified: Secondary | ICD-10-CM | POA: Diagnosis not present

## 2024-01-24 DIAGNOSIS — Z6828 Body mass index (BMI) 28.0-28.9, adult: Secondary | ICD-10-CM

## 2024-01-24 DIAGNOSIS — R7303 Prediabetes: Secondary | ICD-10-CM | POA: Diagnosis not present

## 2024-01-24 NOTE — Progress Notes (Signed)
 WEIGHT SUMMARY AND BIOMETRICS  Vitals Temp: 98.2 F (36.8 C) BP: 116/79 Pulse Rate: (!) 105 SpO2: 99 %   Anthropometric Measurements Height: 5' 2 (1.575 m) Weight: 157 lb (71.2 kg) BMI (Calculated): 28.71 Weight at Last Visit: 153lb Weight Lost Since Last Visit: 0lb Weight Gained Since Last Visit: 4lb Starting Weight: 177lb Total Weight Loss (lbs): 20 lb (9.072 kg) Peak Weight: 180lb   Body Composition  Body Fat %: 36.2 % Fat Mass (lbs): 57 lbs Muscle Mass (lbs): 95.2 lbs Total Body Water (lbs): 65.4 lbs Visceral Fat Rating : 7   Other Clinical Data Fasting: no Labs: no Today's Visit #: 40 Starting Date: 10/02/19    Chief Complaint:   OBESITY [name] Park is here to discuss her progress with her obesity treatment plan.  She is on the keeping a food journal and adhering to recommended goals of 1500-1700 calories and 85g+ protein and states she is following her eating plan approximately 60-75 % of the time.  She states she is exercising Walking 90  minutes 4 times per week.  Interim History:  [name] Park will consistently eat out for lunch: [employer]  [name] Park [name] Park [name] Park [name] Park  Provided lunch parameters: 300-400 calories 35g+ protein  Subjective:   1. Vitamin D  deficiency Discussed Labs  Latest Reference Range & Units 12/20/23 08:56  Vitamin D , 25-Hydroxy 30.0 - 100.0 ng/mL 61.7   Vit D Level stable and at goal  2. Prediabetes Discussed Labs  Latest Reference Range & Units 12/20/23 08:56  Hemoglobin A1C 4.8 - 5.6 % 5.5  Est. average glucose Bld gHb Est-mCnc mg/dL 888  INSULIN  2.6 - 24.9 uIU/mL 38.6 (H)  (H): Data is abnormally high   Latest Reference Range & Units 12/20/23 08:56  Magnesium 1.6 - 2.3 mg/dL 1.5 (L)  (L): Data is abnormally low   A1c at goal Insulin  level improved, however still above goal of 5 Mg++ level just below goal of 1.6 Endocrinology manages Metformin 500mg  2 tabs twice daily. She  has been trailed on GLP-1, Saxenda , Ozempic  She was unable to tolerate weekly injectables.  3. Hypertriglyceridemia Discussed Labs Lipid Panel     Component Value Date/Time   CHOL 99 (L) 12/20/2023 0856   TRIG 155 (H) 12/20/2023 0856   HDL 38 (L) 12/20/2023 0856   CHOLHDL 2.6 12/20/2023 0856   LDLCALC 35 12/20/2023 0856   LABVLDL 26 12/20/2023 0856   The ASCVD Risk score (Arnett DK, et al., 2019) failed to calculate for the following reasons:   The valid total cholesterol range is 130 to 320 mg/dL   HWW manages Vascepa  1g 2 caps BID Endocrinology manages daily Crestor 5mg  Endocrinology is aware of tot and LDL levels trending down and declined adjusting statin therapy  Assessment/Plan:   1. Vitamin D  deficiency Monitor Labs  2. Prediabetes Start daily OTC Mg++ Taurate - take per manufactueres instructions Magnesium taurate is a supplement that combines magnesium with the amino acid taurine, which may support cardiovascular health, nervous system function, and muscle health  3. Hypertriglyceridemia (Primary) Continue healthy eating and regular walking F/u with Endo at chronic f/u, re: statin therapy Continue Vascepa  per [facility]  4. Obesity with current BMI 28.8  [name] Park is currently in the action stage of change. As such, her goal is to continue with weight loss efforts. She has agreed to keeping a food journal and adhering to recommended goals of 1500-1700 calories and 85g+ protein.   Exercise goals: For substantial  health benefits, adults should do at least 150 minutes (2 hours and 30 minutes) a week of moderate-intensity, or 75 minutes (1 hour and 15 minutes) a week of vigorous-intensity aerobic physical activity, or an equivalent combination of moderate- and vigorous-intensity aerobic activity. Aerobic activity should be performed in episodes of at least 10 minutes, and preferably, it should be spread throughout the week.  Behavioral modification strategies: increasing lean  protein intake, decreasing simple carbohydrates, increasing vegetables, increasing water intake, no skipping meals, meal planning and cooking strategies, keeping healthy foods in the home, ways to avoid boredom eating, and planning for success.  [name] Park has agreed to follow-up with our clinic in 4 weeks. She was informed of the importance of frequent follow-up visits to maximize her success with intensive lifestyle modifications for her multiple health conditions.   Objective:   Blood pressure 116/79, pulse (!) 105, temperature 98.2 F (36.8 C), height 5' 2 (1.575 m), weight 157 lb (71.2 kg), SpO2 99%. Body mass index is 28.72 kg/m.  General: Cooperative, alert, well developed, in no acute distress. HEENT: Conjunctivae and lids unremarkable. Cardiovascular: Regular rhythm.  Lungs: Normal work of breathing. Neurologic: No focal deficits.   Lab Results  Component Value Date   CREATININE 0.83 05/24/2023   BUN 13 05/24/2023   NA 136 05/24/2023   K 4.8 05/24/2023   CL 102 05/24/2023   CO2 20 05/24/2023   Lab Results  Component Value Date   ALT 15 05/24/2023   AST 18 05/24/2023   ALKPHOS 65 05/24/2023   BILITOT <0.2 05/24/2023   Lab Results  Component Value Date   HGBA1C 5.5 12/20/2023   HGBA1C 5.7 (H) 05/24/2023   HGBA1C 5.6 10/25/2022   HGBA1C 5.8 (H) 03/28/2022   HGBA1C 5.5 11/23/2021   Lab Results  Component Value Date   INSULIN  38.6 (H) 12/20/2023   INSULIN  39.8 (H) 05/24/2023   INSULIN  25.7 (H) 09/13/2022   INSULIN  30.4 (H) 03/28/2022   INSULIN  62.3 (H) 11/23/2021   Lab Results  Component Value Date   TSH 1.270 05/24/2023   Lab Results  Component Value Date   CHOL 99 (L) 12/20/2023   HDL 38 (L) 12/20/2023   LDLCALC 35 12/20/2023   TRIG 155 (H) 12/20/2023   CHOLHDL 2.6 12/20/2023   Lab Results  Component Value Date   VD25OH 61.7 12/20/2023   VD25OH 58.5 05/24/2023   VD25OH 76.0 09/13/2022   Lab Results  Component Value Date   WBC 8.1 10/02/2019    HGB 13.3 10/02/2019   HCT 39.1 10/02/2019   MCV 89 10/02/2019   PLT 306 10/02/2019   No results found for: IRON, TIBC, FERRITIN  Attestation Statements:   Reviewed by clinician on day of visit: allergies, medications, problem list, medical history, surgical history, family history, social history, and previous encounter notes.  I have reviewed the above documentation for accuracy and completeness, and I agree with the above. -  Jullia Mulligan d. Shenia Alan, NP-C

## 2024-01-29 DIAGNOSIS — F411 Generalized anxiety disorder: Secondary | ICD-10-CM | POA: Diagnosis not present

## 2024-01-29 DIAGNOSIS — F3181 Bipolar II disorder: Secondary | ICD-10-CM | POA: Diagnosis not present

## 2024-02-05 DIAGNOSIS — J301 Allergic rhinitis due to pollen: Secondary | ICD-10-CM | POA: Diagnosis not present

## 2024-02-05 DIAGNOSIS — J3081 Allergic rhinitis due to animal (cat) (dog) hair and dander: Secondary | ICD-10-CM | POA: Diagnosis not present

## 2024-02-05 DIAGNOSIS — J3089 Other allergic rhinitis: Secondary | ICD-10-CM | POA: Diagnosis not present

## 2024-02-21 ENCOUNTER — Ambulatory Visit (INDEPENDENT_AMBULATORY_CARE_PROVIDER_SITE_OTHER): Payer: Self-pay | Admitting: Adult Health

## 2024-02-21 VITALS — BP 114/78 | HR 102 | Temp 98.2°F | Ht 62.0 in | Wt 155.0 lb

## 2024-02-21 DIAGNOSIS — E559 Vitamin D deficiency, unspecified: Secondary | ICD-10-CM | POA: Diagnosis not present

## 2024-02-21 DIAGNOSIS — Z6828 Body mass index (BMI) 28.0-28.9, adult: Secondary | ICD-10-CM

## 2024-02-21 DIAGNOSIS — E669 Obesity, unspecified: Secondary | ICD-10-CM | POA: Diagnosis not present

## 2024-02-21 DIAGNOSIS — E781 Pure hyperglyceridemia: Secondary | ICD-10-CM | POA: Diagnosis not present

## 2024-02-21 DIAGNOSIS — R7303 Prediabetes: Secondary | ICD-10-CM | POA: Diagnosis not present

## 2024-02-21 DIAGNOSIS — F3181 Bipolar II disorder: Secondary | ICD-10-CM | POA: Diagnosis not present

## 2024-02-21 DIAGNOSIS — Z6830 Body mass index (BMI) 30.0-30.9, adult: Secondary | ICD-10-CM

## 2024-02-21 MED ORDER — VASCEPA 1 G PO CAPS: ORAL_CAPSULE | ORAL | 0 refills | Status: DC

## 2024-02-21 NOTE — Progress Notes (Signed)
 [name] Park Last Visit: 0lb Starting [name]: 177lb Total [name] Loss (lbs): 22 lb (9.979 kg) Peak [name]: 180lb   Body Composition  Body Fat %: 35.1 % Fat Mass (lbs): 54.6 lbs Muscle Mass (lbs): 95.6 lbs Total Body Water (lbs): 64.2 lbs Visceral Fat Rating : 7   Other Clinical Data Fasting: No Labs: No Today's Visit #: 50 Starting Date: 10/02/19    Chief Complaint:   OBESITY Lumen is here to discuss her progress with her obesity treatment plan.  She is on the keeping a food journal and adhering to recommended goals of 1500 calories and 90g+ protein and states she is following her eating plan approximately 65 % of the time.  She states she is exercising Walking 60 minutes 4 times per week.  Interim History:  She has yet to start Mg+ supplementation. She wants to streamline supplementation. She is on daily Vit D3 1000, B12 500 mcg Recommend to start daily MVI - will cover all her needs  She reports feeling poorly over the Thanksgiving holiday r/t to medication changes for mood management. She was weaned off Seroquel and stopped nightly Melatonin.  These changes triggered GI upset and HA. She has Park restarted Seroquel at lower dose (150mg  at bedtime) and Trazodone 50mg  at bedtime. She denies any acute sx's at present  Subjective:   1. Vitamin D  deficiency She is taking daily Vit D3 1000 international units   2. Prediabetes Endocrinology manages Metformin 500mg  BID with meals. She was briefly trialed on Ozempic , unable to tolerate due to GI upset.  3. Hypertriglyceridemia Lipid Panel     Component Value Date/Time   CHOL 99 (L) 12/20/2023 0856   TRIG 155 (H) 12/20/2023  0856   HDL 38 (L) 12/20/2023 0856   CHOLHDL 2.6 12/20/2023 0856   LDLCALC 35 12/20/2023 0856   LABVLDL 26 12/20/2023 0856    Endocrinology manages Crestor 5mg  daily HWW manages Vascepa  1g 2 caps BID  4. Bipolar 2 disorder (HCC) She reports feeling poorly over the Thanksgiving holiday r/t to medication changes for mood management. She was weaned off Seroquel and stopped nightly Melatonin.  These changes triggered GI upset and HA. She has Park restarted Seroquel at lower dose (150mg  at bedtime) and Trazodone 50mg  at bedtime. She denies any acute sx's at present  She reports stable mood, denies SI/HI She is VERY excited to go on holiday break later this afternoon until 10 Mar 2024  Assessment/Plan:   1. Vitamin D  deficiency (Primary) Stop Vit D 3 1000 Start daily MVI Monitor labs  2. Prediabetes Start daily MVI  3. Hypertriglyceridemia Refill - VASCEPA  1 g capsule; TAKE TWO CAPSULES TWICE DAILY  Dispense: 120 capsule; Refill: 0  4. Bipolar 2 disorder (HCC) Continue close f/u with mental health care team  5. Obesity with current BMI 28.4  Chelsa is currently in the action stage of change. As such, her goal is to continue with [name] loss efforts. She has agreed to keeping a food journal and adhering to recommended goals of 1500 calories and 90g+ protein.   Exercise goals: All adults should avoid inactivity. Some physical activity is better than none, and adults who participate in any amount of  physical activity gain some health benefits. Adults should also include muscle-strengthening activities that involve all major muscle groups on 2 or more days a week. During Vacation: walk at least once weekly.  Behavioral modification strategies: increasing lean protein intake, decreasing simple carbohydrates, increasing vegetables, increasing water intake, no skipping meals, meal planning and cooking strategies, keeping healthy foods in the home, ways to avoid boredom eating, emotional  eating strategies, holiday eating strategies , celebration eating strategies, and planning for success.  Shayna has agreed to follow-up with our clinic in 4 weeks. She was informed of the importance of frequent follow-up visits to maximize her success with intensive lifestyle modifications for her multiple health conditions.   Objective:   Blood pressure 114/78, pulse (!) 102, temperature 98.2 F (36.8 C), height 5' 2 (1.575 m), [name] 155 lb (70.3 kg), SpO2 100%. Body mass index is 28.35 kg/m.  General: Cooperative, alert, well developed, in no acute distress. HEENT: Conjunctivae and lids unremarkable. Cardiovascular: Regular rhythm.  Lungs: Normal work of breathing. Neurologic: No focal deficits.   Lab Results  Component Value Date   CREATININE 0.83 05/24/2023   BUN 13 05/24/2023   NA 136 05/24/2023   K 4.8 05/24/2023   CL 102 05/24/2023   CO2 20 05/24/2023   Lab Results  Component Value Date   ALT 15 05/24/2023   AST 18 05/24/2023   ALKPHOS 65 05/24/2023   BILITOT <0.2 05/24/2023   Lab Results  Component Value Date   HGBA1C 5.5 12/20/2023   HGBA1C 5.7 (H) 05/24/2023   HGBA1C 5.6 10/25/2022   HGBA1C 5.8 (H) 03/28/2022   HGBA1C 5.5 11/23/2021   Lab Results  Component Value Date   INSULIN  38.6 (H) 12/20/2023   INSULIN  39.8 (H) 05/24/2023   INSULIN  25.7 (H) 09/13/2022   INSULIN  30.4 (H) 03/28/2022   INSULIN  62.3 (H) 11/23/2021   Lab Results  Component Value Date   TSH 1.270 05/24/2023   Lab Results  Component Value Date   CHOL 99 (L) 12/20/2023   HDL 38 (L) 12/20/2023   LDLCALC 35 12/20/2023   TRIG 155 (H) 12/20/2023   CHOLHDL 2.6 12/20/2023   Lab Results  Component Value Date   VD25OH 61.7 12/20/2023   VD25OH 58.5 05/24/2023   VD25OH 76.0 09/13/2022   Lab Results  Component Value Date   WBC 8.1 10/02/2019   HGB 13.3 10/02/2019   HCT 39.1 10/02/2019   MCV 89 10/02/2019   PLT 306 10/02/2019   No results found for: IRON, TIBC,  FERRITIN  Attestation Statements:   Reviewed by clinician on day of visit: allergies, medications, problem list, medical history, surgical history, family history, social history, and previous encounter notes.  I have reviewed the above documentation for accuracy and completeness, and I agree with the above. -  Cardarius Senat d. Rayven Hendrickson, NP-C

## 2024-04-02 ENCOUNTER — Ambulatory Visit (INDEPENDENT_AMBULATORY_CARE_PROVIDER_SITE_OTHER): Admitting: Adult Health

## 2024-04-09 ENCOUNTER — Other Ambulatory Visit (INDEPENDENT_AMBULATORY_CARE_PROVIDER_SITE_OTHER): Payer: Self-pay | Admitting: Adult Health

## 2024-04-09 DIAGNOSIS — E781 Pure hyperglyceridemia: Secondary | ICD-10-CM

## 2024-04-30 ENCOUNTER — Ambulatory Visit (INDEPENDENT_AMBULATORY_CARE_PROVIDER_SITE_OTHER): Admitting: Adult Health

## 2024-05-14 ENCOUNTER — Ambulatory Visit (INDEPENDENT_AMBULATORY_CARE_PROVIDER_SITE_OTHER): Admitting: Physician Assistant
